# Patient Record
Sex: Female | Born: 1955 | Race: White | Hispanic: No | Marital: Married | Smoking: Never smoker
Health system: Southern US, Community
[De-identification: ages and names within clinical notes are randomized; demographics above are authoritative.]

## PROBLEM LIST (undated history)

## (undated) DIAGNOSIS — Z8041 Family history of malignant neoplasm of ovary: Secondary | ICD-10-CM

## (undated) DIAGNOSIS — C4491 Basal cell carcinoma of skin, unspecified: Secondary | ICD-10-CM

## (undated) DIAGNOSIS — Z1501 Genetic susceptibility to malignant neoplasm of breast: Secondary | ICD-10-CM

## (undated) DIAGNOSIS — I10 Essential (primary) hypertension: Secondary | ICD-10-CM

## (undated) DIAGNOSIS — R87619 Unspecified abnormal cytological findings in specimens from cervix uteri: Secondary | ICD-10-CM

## (undated) DIAGNOSIS — F329 Major depressive disorder, single episode, unspecified: Secondary | ICD-10-CM

## (undated) DIAGNOSIS — G459 Transient cerebral ischemic attack, unspecified: Secondary | ICD-10-CM

## (undated) DIAGNOSIS — K529 Noninfective gastroenteritis and colitis, unspecified: Secondary | ICD-10-CM

## (undated) DIAGNOSIS — F32A Depression, unspecified: Secondary | ICD-10-CM

## (undated) DIAGNOSIS — K219 Gastro-esophageal reflux disease without esophagitis: Secondary | ICD-10-CM

## (undated) DIAGNOSIS — Z808 Family history of malignant neoplasm of other organs or systems: Secondary | ICD-10-CM

## (undated) DIAGNOSIS — IMO0002 Reserved for concepts with insufficient information to code with codable children: Secondary | ICD-10-CM

## (undated) DIAGNOSIS — F419 Anxiety disorder, unspecified: Secondary | ICD-10-CM

## (undated) DIAGNOSIS — Z800 Family history of malignant neoplasm of digestive organs: Secondary | ICD-10-CM

## (undated) DIAGNOSIS — Z801 Family history of malignant neoplasm of trachea, bronchus and lung: Secondary | ICD-10-CM

## (undated) HISTORY — DX: Basal cell carcinoma of skin, unspecified: C44.91

## (undated) HISTORY — DX: Depression, unspecified: F32.A

## (undated) HISTORY — DX: Essential (primary) hypertension: I10

## (undated) HISTORY — DX: Family history of malignant neoplasm of other organs or systems: Z80.8

## (undated) HISTORY — DX: Major depressive disorder, single episode, unspecified: F32.9

## (undated) HISTORY — PX: CERVICAL BIOPSY  W/ LOOP ELECTRODE EXCISION: SUR135

## (undated) HISTORY — DX: Family history of malignant neoplasm of trachea, bronchus and lung: Z80.1

## (undated) HISTORY — DX: Genetic susceptibility to malignant neoplasm of breast: Z15.01

## (undated) HISTORY — DX: Family history of malignant neoplasm of digestive organs: Z80.0

## (undated) HISTORY — DX: Transient cerebral ischemic attack, unspecified: G45.9

## (undated) HISTORY — DX: Gastro-esophageal reflux disease without esophagitis: K21.9

## (undated) HISTORY — DX: Noninfective gastroenteritis and colitis, unspecified: K52.9

## (undated) HISTORY — DX: Anxiety disorder, unspecified: F41.9

## (undated) HISTORY — DX: Family history of malignant neoplasm of ovary: Z80.41

## (undated) HISTORY — DX: Reserved for concepts with insufficient information to code with codable children: IMO0002

## (undated) HISTORY — DX: Unspecified abnormal cytological findings in specimens from cervix uteri: R87.619

---

## 1997-11-04 ENCOUNTER — Other Ambulatory Visit: Admission: RE | Admit: 1997-11-04 | Discharge: 1997-11-04 | Payer: Self-pay | Admitting: Obstetrics and Gynecology

## 1999-01-18 ENCOUNTER — Other Ambulatory Visit: Admission: RE | Admit: 1999-01-18 | Discharge: 1999-01-18 | Payer: Self-pay | Admitting: Obstetrics and Gynecology

## 1999-05-08 HISTORY — PX: OOPHORECTOMY: SHX86

## 1999-12-13 ENCOUNTER — Other Ambulatory Visit: Admission: RE | Admit: 1999-12-13 | Discharge: 1999-12-13 | Payer: Self-pay | Admitting: Obstetrics and Gynecology

## 2001-03-14 ENCOUNTER — Other Ambulatory Visit: Admission: RE | Admit: 2001-03-14 | Discharge: 2001-03-14 | Payer: Self-pay | Admitting: Obstetrics and Gynecology

## 2001-04-22 ENCOUNTER — Ambulatory Visit (HOSPITAL_COMMUNITY): Admission: RE | Admit: 2001-04-22 | Discharge: 2001-04-22 | Payer: Self-pay | Admitting: Obstetrics and Gynecology

## 2001-04-22 ENCOUNTER — Encounter (INDEPENDENT_AMBULATORY_CARE_PROVIDER_SITE_OTHER): Payer: Self-pay

## 2002-11-06 ENCOUNTER — Other Ambulatory Visit: Admission: RE | Admit: 2002-11-06 | Discharge: 2002-11-06 | Payer: Self-pay | Admitting: Obstetrics and Gynecology

## 2003-04-27 ENCOUNTER — Other Ambulatory Visit: Payer: Self-pay

## 2004-04-12 ENCOUNTER — Other Ambulatory Visit: Admission: RE | Admit: 2004-04-12 | Discharge: 2004-04-12 | Payer: Self-pay | Admitting: Obstetrics and Gynecology

## 2004-07-05 ENCOUNTER — Ambulatory Visit: Payer: Self-pay | Admitting: Family Medicine

## 2007-08-11 ENCOUNTER — Other Ambulatory Visit: Admission: RE | Admit: 2007-08-11 | Discharge: 2007-08-11 | Payer: Self-pay | Admitting: Obstetrics and Gynecology

## 2008-08-16 ENCOUNTER — Other Ambulatory Visit: Admission: RE | Admit: 2008-08-16 | Discharge: 2008-08-16 | Payer: Self-pay | Admitting: Obstetrics & Gynecology

## 2009-01-12 ENCOUNTER — Encounter: Payer: Self-pay | Admitting: Family Medicine

## 2009-08-18 ENCOUNTER — Ambulatory Visit: Payer: Self-pay | Admitting: Unknown Physician Specialty

## 2009-08-24 LAB — HM MAMMOGRAPHY: HM Mammogram: NORMAL

## 2009-08-24 LAB — CONVERTED CEMR LAB: Pap Smear: NORMAL

## 2009-12-14 LAB — HM COLONOSCOPY: HM Colonoscopy: NORMAL

## 2010-01-25 ENCOUNTER — Encounter: Payer: Self-pay | Admitting: Family Medicine

## 2010-03-02 ENCOUNTER — Ambulatory Visit: Payer: Self-pay | Admitting: Unknown Physician Specialty

## 2010-05-19 ENCOUNTER — Ambulatory Visit
Admission: RE | Admit: 2010-05-19 | Discharge: 2010-05-19 | Payer: Self-pay | Source: Home / Self Care | Attending: Family Medicine | Admitting: Family Medicine

## 2010-05-19 ENCOUNTER — Other Ambulatory Visit: Payer: Self-pay | Admitting: Family Medicine

## 2010-05-19 DIAGNOSIS — F419 Anxiety disorder, unspecified: Secondary | ICD-10-CM | POA: Insufficient documentation

## 2010-05-19 DIAGNOSIS — R5381 Other malaise: Secondary | ICD-10-CM | POA: Insufficient documentation

## 2010-05-19 DIAGNOSIS — K219 Gastro-esophageal reflux disease without esophagitis: Secondary | ICD-10-CM | POA: Insufficient documentation

## 2010-05-19 DIAGNOSIS — R5383 Other fatigue: Secondary | ICD-10-CM

## 2010-05-19 DIAGNOSIS — I10 Essential (primary) hypertension: Secondary | ICD-10-CM | POA: Insufficient documentation

## 2010-05-19 DIAGNOSIS — F329 Major depressive disorder, single episode, unspecified: Secondary | ICD-10-CM | POA: Insufficient documentation

## 2010-05-19 DIAGNOSIS — F411 Generalized anxiety disorder: Secondary | ICD-10-CM | POA: Insufficient documentation

## 2010-05-19 LAB — CBC WITH DIFFERENTIAL/PLATELET
Basophils Absolute: 0.1 10*3/uL (ref 0.0–0.1)
Basophils Relative: 1 % (ref 0.0–3.0)
Eosinophils Absolute: 0.1 10*3/uL (ref 0.0–0.7)
Eosinophils Relative: 1 % (ref 0.0–5.0)
HCT: 37.1 % (ref 36.0–46.0)
Hemoglobin: 12.9 g/dL (ref 12.0–15.0)
Lymphocytes Relative: 32.1 % (ref 12.0–46.0)
Lymphs Abs: 2.2 10*3/uL (ref 0.7–4.0)
MCHC: 34.9 g/dL (ref 30.0–36.0)
MCV: 93.4 fl (ref 78.0–100.0)
Monocytes Absolute: 0.4 10*3/uL (ref 0.1–1.0)
Monocytes Relative: 6.3 % (ref 3.0–12.0)
Neutro Abs: 4 10*3/uL (ref 1.4–7.7)
Neutrophils Relative %: 59.6 % (ref 43.0–77.0)
Platelets: 228 10*3/uL (ref 150.0–400.0)
RBC: 3.97 Mil/uL (ref 3.87–5.11)
RDW: 13.6 % (ref 11.5–14.6)
WBC: 6.8 10*3/uL (ref 4.5–10.5)

## 2010-05-19 LAB — BASIC METABOLIC PANEL
BUN: 16 mg/dL (ref 6–23)
CO2: 32 mEq/L (ref 19–32)
Calcium: 9 mg/dL (ref 8.4–10.5)
Chloride: 100 mEq/L (ref 96–112)
Creatinine, Ser: 0.9 mg/dL (ref 0.4–1.2)
GFR: 71.74 mL/min (ref >60.00)
Glucose, Bld: 83 mg/dL (ref 70–99)
Potassium: 4 mEq/L (ref 3.5–5.1)
Sodium: 139 mEq/L (ref 135–145)

## 2010-05-19 LAB — TSH: TSH: 0.65 u[IU]/mL (ref 0.35–5.50)

## 2010-05-23 ENCOUNTER — Encounter: Payer: Self-pay | Admitting: Family Medicine

## 2010-06-08 NOTE — Assessment & Plan Note (Signed)
Summary: NEW PT TO EST..BCBS...CYD   Vital Signs:  Patient profile:   55 year old female Height:      65 inches Weight:      195.12 pounds BMI:     32.59 Temp:     98.6 degrees F oral Pulse rate:   74 / minute Pulse rhythm:   regular BP sitting:   130 / 90  (right arm) Cuff size:   regular  Vitals Entered By: Linde Gillis CMA Duncan Dull) (02-Jun-2010 2:01 PM) CC: new patient, establish care   History of Present Illness: 55 yo here to establish care.  HTN- has been on Triamterene-HCTZ 75-50 mg daily for years.  PCP added Amlodipine 5 mg daily a few months ago.  She has not noticed any side effects. No HA, blurred vision, CP, SOB or LE edema.  Depression/anxiety- has been on Prozac 20 mg for several years along with Temazepam as needed.  Denies any symptoms of anxiety or depression currently.  Leg cramps- has noticed LE cramps intermittently at night for past several months.  Denies any LE weakness.  Told years ago that she has restless leg.    GERD- placed on Omeprazole 40 mg two times a day several months ago!  Pt denies any PUD or GIB.  Unsure why she is on such a high dose.  Just had a "little indigestion." Per pt, normal endoscopy and colonscopy 02/2010.  Fatigue- has been a little more fatigued lately and having difficulty losing weight.  no SOB or CP.  No LE edema.  she wants me to prescribe phenteramine or other weight loss supplemnts.    Preventive Screening-Counseling & Management  Alcohol-Tobacco     Smoking Status: never  Caffeine-Diet-Exercise     Does Patient Exercise: yes      Drug Use:  no.    Current Medications (verified): 1)  Amlodipine Besylate 5 Mg Tabs (Amlodipine Besylate) .... Take One Tablet By Mouth Daily 2)  Temazepam 30 Mg Caps (Temazepam) .... Take One Tablet By Mouth As Needed 3)  Fluoxetine Hcl 10 Mg Caps (Fluoxetine Hcl) .... Take One Tablet By Mouth Daily 4)  Triamterene-Hctz 75-50 Mg Tabs (Triamterene-Hctz) .... One By Mouth Daily 5)   Premarin 0.625 Mg Tabs (Estrogens Conjugated) .... One By Mouth Daily  Allergies (verified): 1)  ! Penicillin  Past History:  Family History: Last updated: June 02, 2010 Dad died of MI at 66 Mom - HTN, DM  Social History: Last updated: 06/02/10 Married 2 adult children works for [employer] Never Smoked Alcohol use-yes Drug use-no Regular exercise-yes  Risk Factors: Exercise: yes (06/02/10)  Risk Factors: Smoking Status: never (06/02/10)  Past Medical History: Hypertension Anxiety Depression GERD  Past Surgical History: Oophorectomy for recurrent ovarian cyst in 09-30-99  Family History: Dad died of MI at 4 Mom - HTN, DM  Social History: Married 2 adult children works for [employer] Never Smoked Alcohol use-yes Drug use-no Regular exercise-yes Smoking Status:  never Drug Use:  no Does Patient Exercise:  yes  Review of Systems      See HPI General:  Denies malaise. Eyes:  Denies blurring. ENT:  Denies difficulty swallowing. CV:  Denies chest pain or discomfort. Resp:  Denies shortness of breath. GI:  Denies abdominal pain, bloody stools, indigestion, nausea, and vomiting. GU:  Denies urinary frequency and urinary hesitancy. MS:  Complains of cramps; denies muscle weakness, stiffness, and thoracic pain. Derm:  Denies rash. Neuro:  Denies headaches. Psych:  Denies anxiety and depression.  Endo:  Denies cold intolerance and heat intolerance. Heme:  Denies abnormal bruising and bleeding.  Physical Exam  General:  alert and overweight-appearing.  NAD. Head:  normocephalic and atraumatic.   Eyes:  vision grossly intact, pupils equal, and pupils round.   Ears:  R ear normal and L ear normal.   Nose:  no external deformity.   Mouth:  good dentition.   Neck:  No deformities, masses, or tenderness noted. Lungs:  Normal respiratory effort, chest expands symmetrically. Lungs are clear to auscultation, no crackles or wheezes. Heart:  Normal rate and regular  rhythm. S1 and S2 normal without gallop, murmur, click, rub or other extra sounds. Abdomen:  Bowel sounds positive,abdomen soft and non-tender without masses, organomegaly or hernias noted. Msk:  No deformity or scoliosis noted of thoracic or lumbar spine.   Extremities:  no edema Neurologic:  alert & oriented X3 and gait normal.   Skin:  Intact without suspicious lesions or rashes Psych:  Cognition and judgment appear intact. Alert and cooperative with normal attention span and concentration. No apparent delusions, illusions, hallucinations   Impression & Recommendations:  Problem # 1:  HYPERTENSION (ICD-401.9) Assessment Unchanged stable on current medications.  Check BMET.   Her updated medication list for this problem includes:    Amlodipine Besylate 5 Mg Tabs (Amlodipine besylate) .Marland Kitchen... Take one tablet by mouth daily    Triamterene-hctz 75-50 Mg Tabs (Triamterene-hctz) ..... One by mouth daily  Problem # 2:  FATIGUE (ICD-780.79) Assessment: New Likely multifactorial due to busy lifestyle changes.  Will check labs like CBC, TSH and BMET. Explained that I would not prescribe weight loss medications due to her HTN. Orders: Venipuncture (04540) TLB-BMP (Basic Metabolic Panel-BMET) (80048-METABOL) TLB-CBC Platelet - w/Differential (85025-CBCD) TLB-TSH (Thyroid Stimulating Hormone) (84443-TSH)  Problem # 3:  DEPRESSION (ICD-311) Assessment: Unchanged Stable on current medications. Her updated medication list for this problem includes:    Fluoxetine Hcl 10 Mg Caps (Fluoxetine hcl) .Marland Kitchen... Take one tablet by mouth daily  Problem # 4:  GERD (ICD-530.81) Assessment: Improved Advised to at least decrease Omeprazole to 40 mg once daily or stop it all together.  Pt wants to stop it and monitor her symptoms.  Awaiting records.  Complete Medication List: 1)  Amlodipine Besylate 5 Mg Tabs (Amlodipine besylate) .... Take one tablet by mouth daily 2)  Temazepam 30 Mg Caps (Temazepam) ....  Take one tablet by mouth as needed 3)  Fluoxetine Hcl 10 Mg Caps (Fluoxetine hcl) .... Take one tablet by mouth daily 4)  Triamterene-hctz 75-50 Mg Tabs (Triamterene-hctz) .... One by mouth daily 5)  Premarin 0.625 Mg Tabs (Estrogens conjugated) .... One by mouth daily  Patient Instructions: 1)  Great to meet you. 2)  Have a wonderful trip to [place]. Prescriptions: TRIAMTERENE-HCTZ 75-50 MG TABS (TRIAMTERENE-HCTZ) one by mouth daily  #90 x 3   Entered and Authorized by:   Ruthe Mannan MD   Signed by:   Ruthe Mannan MD on 05/19/2010   Method used:   Print then Give to Patient   RxID:   [id number]    Orders Added: 1)  Venipuncture [08657] 2)  TLB-BMP (Basic Metabolic Panel-BMET) [80048-METABOL] 3)  TLB-CBC Platelet - w/Differential [85025-CBCD] 4)  TLB-TSH (Thyroid Stimulating Hormone) [84443-TSH] 5)  New Patient Level IV [84696]    Current Allergies (reviewed today): ! PENICILLIN  Flex Sig Next Due:  Not Indicated Colonoscopy Result Date:  12/14/2009 Colonoscopy Result:  historical normal Hemoccult Next Due:  Not Indicated PAP Result Date:  08/24/2009 PAP Result:  historical normal Mammogram Result Date:  08/24/2009 Mammogram Result:  historical normal

## 2010-06-13 ENCOUNTER — Ambulatory Visit: Payer: Self-pay | Admitting: Family Medicine

## 2010-06-14 NOTE — Miscellaneous (Signed)
Summary: [facility]  ROI   Imported By: Kassie Mends 06/09/2010 08:43:40  _____________________________________________________________________  External Attachment:    Type:   Image     Comment:   Scanned Image

## 2010-06-22 NOTE — Letter (Signed)
Summary: [facility]   Imported By: Kassie Mends 06/12/2010 08:55:35  _____________________________________________________________________  External Attachment:    Type:   Image     Comment:   External Document

## 2010-09-22 NOTE — Op Note (Signed)
[facility]  Patient:    Carla Fuentes, Carla Fuentes Visit Number: [id number] MRN: [id number]          Service Type: DSU Location: [facility] Attending Physician:  Brynda Peon Dictated by:   Edwena Felty. Ashley Royalty, M.D. Proc. Date: 04/22/01 Admit Date:  04/22/2001                             Operative Report  PREOPERATIVE DIAGNOSIS:       A 6 cm right ovarian cyst.  POSTOPERATIVE DIAGNOSIS:      A 6 cm right ovarian cyst, pathology pending.  OPERATION:                    Laparoscopic right salpingo-oophorectomy.  SURGEON:                      Cynthia P. Ashley Royalty, M.D.  ASSISTANT:                    Laqueta Linden, M.D.  ANESTHESIA:                   General endotracheal anesthesia.  ESTIMATED BLOOD LOSS:         Minimal.  COMPLICATIONS:                None.  DESCRIPTION OF PROCEDURE:     The patient was taken to the operating room and after the induction of adequate general endotracheal anesthesia, was placed in the dorsal lithotomy position and prepped and draped in the usual fashion. The bladder was drained with a red rubber catheter.  An acorn uterine manipulator was placed.  A small umbilical incision was made and the Veress needle was inserted into the peritoneal space.  Proper placement was tested by noting a negative aspirate, then free flow of saline through the Veress needle, again with a negative aspirate, and then by noting the response and a drop of saline placed at the hub of the Veress needle to negative pressure as the abdominal wall was elevated.  Pneumoperitoneum was created with three liters of CO2 using the automatic insufflator.  A disposable 10/11 mm trocar was then inserted into the peritoneal space and its proper placement noted with the laparoscope.  Small suprapubic incisions were made on the right and left sides and the lower 5 mm trocars were inserted under direct visualization.  There was subcutaneous air that  apparently happened during the creation of the pneumoperitoneum that pushed the peritoneum away from the subcutaneous tissue and the skin.  This was noted through the laparoscope. The uterine anatomy was normal and was freely mobile.  The left tube and ovary were also normal.  On the right there was a large right ovarian cyst.  It was smooth.  The tube appeared normal on that side.  The tripolar cautery was used to separate the tube and ovary from the uterus, cauterizing the tubes, the round and the utero-ovarian ligaments and cutting them with the tripolar. When these were free, the ovarian cyst was elevated and the ureter was clearly identified.  Two 0 Vicryl endoloops were then used to tie off the infundibulopelvic ligament.  The specimen was removed with the hook scissors. The cyst was too large initially to allow the cyst to pass through an open endoloop and, therefore, before closing down the endoloop, the cyst was aspirated for clear fluid and this  was sent to pathology along with the specimen when it was removed.   The cyst was grasped with pickups after removal while still in the abdomen and separated into several pieces that could then be removed through the 10 mm trocar in the umbilicus.  Specimen was sent to pathology.  The pedicle was irrigated and was hemostatic.  The ureter was again identified on that side and was intact.  The abdomen was irrigated and the procedure was terminated.  The instruments were removed from the abdomen.  The pneumoperitoneum was allowed to escape.  The incisions were closed subcuticularly with 3-0 Vicryl rapheed, 0.5% Marcaine with epinephrine was used to inject subcutaneously for postop pain relief.  Dressings were placed.  The instruments were removed from the vagina and the procedure was terminated.  The patient tolerated it well and went in satisfactory condition to post anesthesia recovery. Dictated by:   Edwena Felty. Ashley Royalty, M.D. Attending  Physician:  Brynda Peon DD:  04/22/01 TD:  04/22/01 Job: [id number]

## 2010-10-11 ENCOUNTER — Emergency Department: Payer: Self-pay | Admitting: Emergency Medicine

## 2010-11-23 ENCOUNTER — Other Ambulatory Visit: Payer: Self-pay | Admitting: Family Medicine

## 2010-11-23 NOTE — Telephone Encounter (Signed)
Ok to refill once, needs office visit.

## 2010-12-27 ENCOUNTER — Other Ambulatory Visit: Payer: Self-pay | Admitting: Family Medicine

## 2010-12-28 ENCOUNTER — Other Ambulatory Visit: Payer: Self-pay | Admitting: *Deleted

## 2010-12-28 ENCOUNTER — Encounter: Payer: Self-pay | Admitting: Family Medicine

## 2010-12-28 LAB — HM SIGMOIDOSCOPY

## 2010-12-28 MED ORDER — AMLODIPINE BESYLATE 5 MG PO TABS: 5.0000 mg | ORAL_TABLET | Freq: Every day | ORAL | Status: DC

## 2010-12-28 NOTE — Telephone Encounter (Signed)
Refill denied, patient needs an OV before any further refills will be given per Dr. Dayton Martes.

## 2010-12-29 ENCOUNTER — Telehealth: Payer: Self-pay | Admitting: *Deleted

## 2010-12-29 ENCOUNTER — Encounter: Payer: BC Managed Care – PPO | Admitting: Family Medicine

## 2010-12-29 NOTE — Progress Notes (Signed)
Pt rescheduled

## 2010-12-29 NOTE — Telephone Encounter (Signed)
Patient was scheduled to see Dr. Dayton Martes today at 1:45 for medication refill.  She showed up at 2:01 pm and was advised per Dr. Dayton Martes she will need to reschedule due to her being late.  We offered her an appt with Dr. Sharen Hones but she refused.  Patient stated that it was rude of Dr. Dayton Martes to not see her.  Patient became angry and left.  Jamesetta So was advised of this.

## 2010-12-29 NOTE — Telephone Encounter (Signed)
I will route to [place].

## 2011-01-01 NOTE — Telephone Encounter (Signed)
Noted  

## 2011-01-27 LAB — HM PAP SMEAR

## 2011-03-08 ENCOUNTER — Telehealth: Payer: Self-pay | Admitting: Family Medicine

## 2011-03-08 MED ORDER — CONJ ESTROG-MEDROXYPROGEST ACE PO TABS: 1.0000 | ORAL_TABLET | Freq: Every day | ORAL | Status: DC

## 2011-03-08 NOTE — Telephone Encounter (Signed)
Called pt, she is not on premarin. Her GYN prescribes premphase. Updated her med list.

## 2011-05-15 ENCOUNTER — Encounter: Payer: Self-pay | Admitting: Family Medicine

## 2011-05-15 ENCOUNTER — Telehealth: Payer: Self-pay | Admitting: *Deleted

## 2011-05-15 ENCOUNTER — Ambulatory Visit (INDEPENDENT_AMBULATORY_CARE_PROVIDER_SITE_OTHER)
Admission: RE | Admit: 2011-05-15 | Discharge: 2011-05-15 | Disposition: A | Discharge status: Home / Self Care | Payer: BC Managed Care – PPO | Source: Ambulatory Visit | Attending: Family Medicine | Admitting: Family Medicine

## 2011-05-15 ENCOUNTER — Ambulatory Visit (INDEPENDENT_AMBULATORY_CARE_PROVIDER_SITE_OTHER): Payer: BC Managed Care – PPO | Admitting: Family Medicine

## 2011-05-15 VITALS — BP 130/80 | HR 62 | Temp 97.7°F

## 2011-05-15 DIAGNOSIS — R059 Cough, unspecified: Secondary | ICD-10-CM

## 2011-05-15 DIAGNOSIS — R05 Cough: Secondary | ICD-10-CM

## 2011-05-15 MED ORDER — FLUTICASONE-SALMETEROL 100-50 MCG/DOSE IN AEPB: 1.0000 | INHALATION_SPRAY | RESPIRATORY_TRACT | Status: DC

## 2011-05-15 MED ORDER — ALBUTEROL SULFATE HFA 108 (90 BASE) MCG/ACT IN AERS: 2.0000 | INHALATION_SPRAY | Freq: Four times a day (QID) | RESPIRATORY_TRACT | Status: DC | PRN

## 2011-05-15 NOTE — Patient Instructions (Signed)
Good to see you. I will call you with your xray results.   

## 2011-05-15 NOTE — Progress Notes (Signed)
Subjective:    Patient ID: Carla Fuentes, female    DOB: 04/16/1956, 56 y.o.   MRN: [id number]  HPI  56 yo non smoker here to discuss cough for several years.  She has had some URI symptoms but she is more concerned that this cough is dry and has been on and off for several years.  Sometimes she feels SOB.  Was told years ago it was reflux but antacids do not seem to help. Never had formal allergy testing done.  She is very concerned because in the 1990s she remodeled a kitchen that was lined with asbestos.    No fevers, weight loss, extreme fatigue or night sweats.  Patient Active Problem List  Diagnoses  . ANXIETY  . DEPRESSION  . HYPERTENSION  . GERD  . FATIGUE  . Cough   Past Medical History  Diagnosis Date  . Hypertension   . Anxiety   . Depression   . GERD (gastroesophageal reflux disease)    Past Surgical History  Procedure Date  . Oophorectomy 2001    for recurrent ovarian cyst   History  Substance Use Topics  . Smoking status: Never Smoker   . Smokeless tobacco: Not on file  . Alcohol Use: Yes   Family History  Problem Relation Age of Onset  . Diabetes Mother   . Hypertension Mother    Allergies  Allergen Reactions  . Penicillins    Current Outpatient Prescriptions on File Prior to Visit  Medication Sig Dispense Refill  . amLODipine (NORVASC) 5 MG tablet Take 1 tablet (5 mg total) by mouth daily.  30 tablet  6  . conjugated estrogens-medroxyprogesteron (PREMPHASE) TABS Take 1 tablet by mouth daily.  30 tablet  0  . FLUoxetine (PROZAC) 10 MG tablet Take 10 mg by mouth daily.        . temazepam (RESTORIL) 30 MG capsule Take 30 mg by mouth as needed.        . triamterene-hydrochlorothiazide (MAXZIDE) 75-50 MG per tablet Take 1 tablet by mouth daily.         The PMH, PSH, Social History, Family History, Medications, and allergies have been reviewed in [facility], and have been updated if relevant.   Review of Systems    See HPI No GERD  symptoms. Objective:   Physical Exam BP 130/80  Pulse 62  Temp(Src) 97.7 F (36.5 C) (Oral)  General:  Well-developed,well-nourished,in no acute distress; alert,appropriate and cooperative throughout examination Head:  normocephalic and atraumatic.   Eyes:  vision grossly intact, pupils equal, pupils round, and pupils reactive to light.   Ears:  R ear normal and L ear normal.   Nose:  no external deformity.   Mouth:  good dentition, mild PND.   Lungs:  Normal respiratory effort, chest expands symmetrically. Lungs are clear to auscultation, no crackles or wheezes. Heart:  Normal rate and regular rhythm. S1 and S2 normal without gallop, murmur, click, rub or other extra sounds. Msk:  No deformity or scoliosis noted of thoracic or lumbar spine.   Extremities:  No clubbing, cyanosis, edema, or deformity noted with normal full range of motion of all joints.   Neurologic:  alert & oriented X3 and gait normal.   Skin:  Intact without suspicious lesions or rashes Psych:  Cognition and judgment appear intact. Alert and cooperative with normal attention span and concentration. No apparent delusions, illusions, hallucinations    Assessment & Plan:   1. Cough  DG Chest 2 View   Persistent.  Will get xray today.  No abnormal lungs sounds- will try advair to see if there is a reactive airway component although no wheezes on exam today. Explained that unfortunately there is no screening test for mesothelioma to date but we can get an xray today and follow up with a CT scan to see if there is any scarring or pleural thickening. The patient indicates understanding of these issues and agrees with the plan. For cough, we can refer to pulm.  She would like to wait to see results of xray and CT scan first.

## 2011-05-15 NOTE — Telephone Encounter (Signed)
Order placed

## 2011-05-15 NOTE — Telephone Encounter (Signed)
Patient would like referral for CT scan.  She will wait to hear from [facility].

## 2011-05-18 ENCOUNTER — Ambulatory Visit (INDEPENDENT_AMBULATORY_CARE_PROVIDER_SITE_OTHER)
Admission: RE | Admit: 2011-05-18 | Discharge: 2011-05-18 | Disposition: A | Discharge status: Home / Self Care | Payer: BC Managed Care – PPO | Source: Ambulatory Visit | Attending: Family Medicine | Admitting: Family Medicine

## 2011-05-18 ENCOUNTER — Telehealth: Payer: Self-pay | Admitting: Internal Medicine

## 2011-05-18 DIAGNOSIS — R059 Cough, unspecified: Secondary | ICD-10-CM

## 2011-05-18 DIAGNOSIS — R05 Cough: Secondary | ICD-10-CM

## 2011-05-18 NOTE — Telephone Encounter (Signed)
We have not yet received it and I am leaving in 30 minutes.  She likely will not get a call back today.

## 2011-05-18 NOTE — Telephone Encounter (Signed)
Patient advised as instructed via telephone.  She had a CT scan not MRI.  Advised patient that the results are not back yet, it may take a little longer because it has to be read by a radiologist.  Advised patient that she more than likely will not hear back about the results today but if there is anything urgent seen on the CT scan Dr. Dayton Martes will be notified right away.

## 2011-05-18 NOTE — Telephone Encounter (Signed)
Patient would like to know her MRI results today if possible.  She had the test done today.  She would like a phone call to let her know if we did or didn't receive the results.

## 2011-05-22 ENCOUNTER — Other Ambulatory Visit: Payer: Self-pay | Admitting: Family Medicine

## 2011-05-22 DIAGNOSIS — J309 Allergic rhinitis, unspecified: Secondary | ICD-10-CM | POA: Insufficient documentation

## 2011-05-22 DIAGNOSIS — R05 Cough: Secondary | ICD-10-CM

## 2011-05-22 DIAGNOSIS — R059 Cough, unspecified: Secondary | ICD-10-CM

## 2011-06-12 ENCOUNTER — Institutional Professional Consult (permissible substitution): Payer: BC Managed Care – PPO | Admitting: Pulmonary Disease

## 2011-06-18 ENCOUNTER — Institutional Professional Consult (permissible substitution): Payer: BC Managed Care – PPO | Admitting: Internal Medicine

## 2011-07-16 ENCOUNTER — Institutional Professional Consult (permissible substitution): Payer: BC Managed Care – PPO | Admitting: Internal Medicine

## 2011-08-06 ENCOUNTER — Other Ambulatory Visit: Payer: Self-pay | Admitting: Family Medicine

## 2011-08-29 LAB — HM PAP SMEAR

## 2012-01-08 ENCOUNTER — Other Ambulatory Visit: Payer: Self-pay | Admitting: Family Medicine

## 2012-01-08 ENCOUNTER — Other Ambulatory Visit: Payer: BC Managed Care – PPO

## 2012-01-08 DIAGNOSIS — Z0000 Encounter for general adult medical examination without abnormal findings: Secondary | ICD-10-CM

## 2012-01-08 DIAGNOSIS — R5383 Other fatigue: Secondary | ICD-10-CM

## 2012-01-08 DIAGNOSIS — Z13220 Encounter for screening for lipoid disorders: Secondary | ICD-10-CM

## 2012-01-08 DIAGNOSIS — R5381 Other malaise: Secondary | ICD-10-CM

## 2012-01-08 NOTE — Addendum Note (Signed)
Addended by: Alvina Chou on: 01/08/2012 10:54 AM   Modules accepted: Orders

## 2012-01-09 ENCOUNTER — Encounter: Payer: BC Managed Care – PPO | Admitting: Family Medicine

## 2012-01-21 ENCOUNTER — Other Ambulatory Visit: Payer: Self-pay | Admitting: Family Medicine

## 2012-02-24 ENCOUNTER — Other Ambulatory Visit: Payer: Self-pay | Admitting: Family Medicine

## 2012-02-28 LAB — HM MAMMOGRAPHY: HM Mammogram: NORMAL

## 2012-06-22 ENCOUNTER — Other Ambulatory Visit: Payer: Self-pay | Admitting: Family Medicine

## 2012-07-24 ENCOUNTER — Other Ambulatory Visit: Payer: Self-pay | Admitting: Family Medicine

## 2012-07-24 NOTE — Telephone Encounter (Signed)
Medicine sent to pharmacy

## 2012-07-24 NOTE — Telephone Encounter (Signed)
Ok to refill one month until she gets established with Dr. Dan Humphreys.

## 2012-07-24 NOTE — Telephone Encounter (Signed)
Refill request for amlodipine.  Patient hasnt been seen since 1/13 and she has a new patient appt with Dr. Dan Humphreys next month.

## 2012-08-28 ENCOUNTER — Encounter: Payer: Self-pay | Admitting: Internal Medicine

## 2012-08-28 ENCOUNTER — Ambulatory Visit (INDEPENDENT_AMBULATORY_CARE_PROVIDER_SITE_OTHER): Payer: BC Managed Care – PPO | Admitting: Internal Medicine

## 2012-08-28 VITALS — BP 116/76 | HR 73 | Temp 98.9°F | Ht 65.0 in | Wt 181.0 lb

## 2012-08-28 DIAGNOSIS — J0100 Acute maxillary sinusitis, unspecified: Secondary | ICD-10-CM

## 2012-08-28 DIAGNOSIS — E785 Hyperlipidemia, unspecified: Secondary | ICD-10-CM | POA: Insufficient documentation

## 2012-08-28 DIAGNOSIS — I10 Essential (primary) hypertension: Secondary | ICD-10-CM

## 2012-08-28 DIAGNOSIS — R5381 Other malaise: Secondary | ICD-10-CM

## 2012-08-28 DIAGNOSIS — F329 Major depressive disorder, single episode, unspecified: Secondary | ICD-10-CM

## 2012-08-28 DIAGNOSIS — E669 Obesity, unspecified: Secondary | ICD-10-CM

## 2012-08-28 LAB — COMPREHENSIVE METABOLIC PANEL
ALT: 17 U/L (ref 0–35)
AST: 17 U/L (ref 0–37)
Albumin: 3.5 g/dL (ref 3.5–5.2)
Alkaline Phosphatase: 62 U/L (ref 39–117)
BUN: 13 mg/dL (ref 6–23)
CO2: 32 mEq/L (ref 19–32)
Calcium: 9.4 mg/dL (ref 8.4–10.5)
Chloride: 100 mEq/L (ref 96–112)
Creatinine, Ser: 0.8 mg/dL (ref 0.4–1.2)
GFR: 74.1 mL/min (ref >60.00)
Glucose, Bld: 95 mg/dL (ref 70–99)
Potassium: 3.5 mEq/L (ref 3.5–5.1)
Sodium: 136 mEq/L (ref 135–145)
Total Bilirubin: 0.3 mg/dL (ref 0.3–1.2)
Total Protein: 6.6 g/dL (ref 6.0–8.3)

## 2012-08-28 LAB — CBC WITH DIFFERENTIAL/PLATELET
Basophils Absolute: 0 10*3/uL (ref 0.0–0.1)
Basophils Relative: 0.4 % (ref 0.0–3.0)
Eosinophils Absolute: 0 10*3/uL (ref 0.0–0.7)
Eosinophils Relative: 0.6 % (ref 0.0–5.0)
HCT: 41 % (ref 36.0–46.0)
Hemoglobin: 13.9 g/dL (ref 12.0–15.0)
Lymphocytes Relative: 27.4 % (ref 12.0–46.0)
Lymphs Abs: 2.2 10*3/uL (ref 0.7–4.0)
MCHC: 33.8 g/dL (ref 30.0–36.0)
MCV: 93.4 fl (ref 78.0–100.0)
Monocytes Absolute: 0.6 10*3/uL (ref 0.1–1.0)
Monocytes Relative: 7.9 % (ref 3.0–12.0)
Neutro Abs: 5 10*3/uL (ref 1.4–7.7)
Neutrophils Relative %: 63.7 % (ref 43.0–77.0)
Platelets: 237 10*3/uL (ref 150.0–400.0)
RBC: 4.39 Mil/uL (ref 3.87–5.11)
RDW: 13.2 % (ref 11.5–14.6)
WBC: 7.9 10*3/uL (ref 4.5–10.5)

## 2012-08-28 LAB — LIPID PANEL
Cholesterol: 200 mg/dL (ref 0–200)
HDL: 52.5 mg/dL (ref >39.00)
LDL Cholesterol: 120 mg/dL — ABNORMAL HIGH (ref 0–99)
Total CHOL/HDL Ratio: 4
Triglycerides: 137 mg/dL (ref 0.0–149.0)
VLDL: 27.4 mg/dL (ref 0.0–40.0)

## 2012-08-28 LAB — VITAMIN B12: Vitamin B-12: >1500 pg/mL — ABNORMAL HIGH (ref 211–911)

## 2012-08-28 LAB — TSH: TSH: 0.97 u[IU]/mL (ref 0.35–5.50)

## 2012-08-28 LAB — LUTEINIZING HORMONE: LH: 20.4 m[IU]/mL

## 2012-08-28 LAB — FOLLICLE STIMULATING HORMONE: FSH: 19.7 m[IU]/mL

## 2012-08-28 MED ORDER — AZITHROMYCIN 250 MG PO TABS: ORAL_TABLET | ORAL | Status: DC

## 2012-08-28 MED ORDER — MEDROXYPROGESTERONE ACETATE 2.5 MG PO TABS: 2.5000 mg | ORAL_TABLET | Freq: Every day | ORAL | Status: DC

## 2012-08-28 MED ORDER — VENLAFAXINE HCL ER 75 MG PO CP24: 75.0000 mg | ORAL_CAPSULE | Freq: Every day | ORAL | Status: DC

## 2012-08-28 MED ORDER — TRIAMTERENE-HCTZ 75-50 MG PO TABS: ORAL_TABLET | ORAL | Status: DC

## 2012-08-28 NOTE — Progress Notes (Signed)
Subjective:    Patient ID: Carla Fuentes, female    DOB: 11/21/1955, 57 y.o.   MRN: [id number]  HPI 57 year old female with history of anxiety/depression, hypertension presents to establish care. She was previously seen by another provider at [facility]. Her primary concern today is recent worsening of anxiety and depression. She notes that her family members have commented that she has not been acting like herself. She reports no improvement with use of fluoxetine. She denies suicidal ideation. She questions whether symptoms of anxiety and depression may be worsened by menopause. She was recently told by her OB/GYN that she is in menopause and she was started on hormone replacement. She denies any improvement in symptoms with this.  She is also concerned today about two-week history of nasal congestion. She reports a for congestion was initially clear and then became purulent. She describes bilateral maxillary sinus pressure and pain. She has been taking over-the-counter Zyrtec and doing nasal saline washes with no improvement. She denies any fever, chills, cough.  In regards to blood pressure, she reports she was initially started on diuretics when she developed lower extremity edema. Then over the next few years her blood pressure continued to be elevated and she was started on amlodipine. She has not taken amlodipine today. She questions whether she actually needs this medication. She denies any chest pain, headache, palpitations. She reports that blood pressures generally low, less than 120/80 when she checks it.  Outpatient Encounter Prescriptions as of 08/28/2012  Medication Sig Dispense Refill  . estradiol (ESTRACE) 1 MG tablet Take 1 mg by mouth daily.      . medroxyPROGESTERone (PROVERA) 2.5 MG tablet Take 1 tablet (2.5 mg total) by mouth daily.  30 tablet  3  . temazepam (RESTORIL) 30 MG capsule Take 30 mg by mouth as needed.        . triamterene-hydrochlorothiazide (MAXZIDE) 75-50 MG per  tablet TAKE 1 TABLET BY MOUTH EVERY DAY  90 tablet  4  . albuterol (PROVENTIL HFA;VENTOLIN HFA) 108 (90 BASE) MCG/ACT inhaler Inhale 2 puffs into the lungs every 6 (six) hours as needed for wheezing.  1 Inhaler  0  . azithromycin (ZITHROMAX Z-PAK) 250 MG tablet Take 2 pills day 1, then 1 pill daily days 2-5  6 each  0  . venlafaxine XR (EFFEXOR-XR) 75 MG 24 hr capsule Take 1 capsule (75 mg total) by mouth daily.  30 capsule  3   No facility-administered encounter medications on file as of 08/28/2012.   BP 116/76  Pulse 73  Temp(Src) 98.9 F (37.2 C) (Oral)  Ht 5\' 5"  (1.651 m)  Wt 181 lb (82.101 kg)  BMI 30.12 kg/m2  SpO2 96%  Review of Systems  Constitutional: Positive for fatigue. Negative for fever, chills, appetite change and unexpected weight change.  HENT: Negative for ear pain, congestion, sore throat, trouble swallowing, neck pain, voice change and sinus pressure.   Eyes: Negative for visual disturbance.  Respiratory: Negative for cough, shortness of breath, wheezing and stridor.   Cardiovascular: Negative for chest pain, palpitations and leg swelling.  Gastrointestinal: Negative for nausea, vomiting, abdominal pain, diarrhea, constipation, blood in stool, abdominal distention and anal bleeding.  Genitourinary: Negative for dysuria and flank pain.  Musculoskeletal: Negative for myalgias, arthralgias and gait problem.  Skin: Negative for color change and rash.  Neurological: Negative for dizziness and headaches.  Hematological: Negative for adenopathy. Does not bruise/bleed easily.  Psychiatric/Behavioral: Positive for dysphoric mood. Negative for suicidal ideas and sleep disturbance. The  patient is nervous/anxious.        Objective:   Physical Exam  Constitutional: She is oriented to person, place, and time. She appears well-developed and well-nourished. No distress.  HENT:  Head: Normocephalic and atraumatic.  Right Ear: External ear normal.  Left Ear: External ear  normal.  Nose: Nose normal.  Mouth/Throat: Oropharynx is clear and moist. No oropharyngeal exudate.  Eyes: Conjunctivae are normal. Pupils are equal, round, and reactive to light. Right eye exhibits no discharge. Left eye exhibits no discharge. No scleral icterus.  Neck: Normal range of motion. Neck supple. No tracheal deviation present. No thyromegaly present.  Cardiovascular: Normal rate, regular rhythm, normal heart sounds and intact distal pulses.  Exam reveals no gallop and no friction rub.   No murmur heard. Pulmonary/Chest: Effort normal and breath sounds normal. No accessory muscle usage. Not tachypneic. No respiratory distress. She has no decreased breath sounds. She has no wheezes. She has no rhonchi. She has no rales. She exhibits no tenderness.  Abdominal: Soft. Bowel sounds are normal. She exhibits no distension and no mass. There is no tenderness. There is no rebound and no guarding.  Musculoskeletal: Normal range of motion. She exhibits no edema and no tenderness.  Lymphadenopathy:    She has no cervical adenopathy.  Neurological: She is alert and oriented to person, place, and time. No cranial nerve deficit. She exhibits normal muscle tone. Coordination normal.  Skin: Skin is warm and dry. No rash noted. She is not diaphoretic. No erythema. No pallor.  Psychiatric: Her speech is normal and behavior is normal. Judgment and thought content normal. Her mood appears anxious. Cognition and memory are normal.          Assessment & Plan:

## 2012-08-28 NOTE — Assessment & Plan Note (Signed)
BMI 30. Encouraged healthy diet and regular physical activity. Discouraged patient from pursuing use of hCG injections as she has used in the past given her known potential side effects. Discussed potentially starting medication to help with appetite such as phentermine or Qsymia, however will hold off to see how she tolerates Effexor first. Follow up 2 weeks.

## 2012-08-28 NOTE — Assessment & Plan Note (Signed)
Symptoms consistent with acute maxillary sinusitis. Will treat with azithromycin. Continue non-sedating antihistamines and prn ibuprofen for pain.

## 2012-08-28 NOTE — Assessment & Plan Note (Signed)
Blood pressure well-controlled today off amlodipine.. Amlodipine and have patient monitor blood pressure at home. Patient will call if blood pressure consistently >140/90. Follow up 2 weeks.

## 2012-08-28 NOTE — Assessment & Plan Note (Signed)
Symptoms of depression recently worsening. Will change from fluoxetine to venlafaxine. Follow up in 2 weeks and prn.

## 2012-08-28 NOTE — Assessment & Plan Note (Signed)
Symptoms of generalized fatigue most likely related to depression and menopause. Will check CBC, CMP, TSH, FSH, LH, B12, Vit D with labs. Follow up 2 weeks. If labs normal and symptoms are persistent, consider sleep study.

## 2012-08-29 LAB — VITAMIN D 25 HYDROXY (VIT D DEFICIENCY, FRACTURES): Vit D, 25-Hydroxy: 44 ng/mL (ref 30–89)

## 2012-09-10 ENCOUNTER — Telehealth: Payer: Self-pay | Admitting: Internal Medicine

## 2012-09-10 NOTE — Telephone Encounter (Signed)
Left detailed message for patient to call office and schedule an appointment.

## 2012-09-10 NOTE — Telephone Encounter (Signed)
Pt needs to make a follow up

## 2012-09-10 NOTE — Telephone Encounter (Signed)
PT WOULD LIKE TO GET A REFERRAL TO BE TESTED FOR SLEEP APNEA.

## 2012-09-10 NOTE — Telephone Encounter (Signed)
PT ALSO WANTED TO KNOW ABOUT THE NEW DIET PILL YOU DISCUSS WITH HER. SHE WANTED MORE INFORMATION ON THIS

## 2012-09-15 NOTE — Telephone Encounter (Signed)
Spoke with patient and appointment has been scheduled 

## 2012-09-15 NOTE — Telephone Encounter (Signed)
Left message to call office to schedule an appointment.

## 2012-09-18 ENCOUNTER — Ambulatory Visit: Payer: BC Managed Care – PPO | Admitting: Internal Medicine

## 2012-09-24 ENCOUNTER — Ambulatory Visit (INDEPENDENT_AMBULATORY_CARE_PROVIDER_SITE_OTHER): Payer: BC Managed Care – PPO | Admitting: Internal Medicine

## 2012-09-24 ENCOUNTER — Encounter: Payer: Self-pay | Admitting: Internal Medicine

## 2012-09-24 VITALS — BP 114/80 | HR 76 | Temp 98.7°F | Wt 183.0 lb

## 2012-09-24 DIAGNOSIS — R41840 Attention and concentration deficit: Secondary | ICD-10-CM | POA: Insufficient documentation

## 2012-09-24 DIAGNOSIS — M545 Low back pain, unspecified: Secondary | ICD-10-CM | POA: Insufficient documentation

## 2012-09-24 DIAGNOSIS — G4700 Insomnia, unspecified: Secondary | ICD-10-CM

## 2012-09-24 MED ORDER — ESZOPICLONE 2 MG PO TABS: 2.0000 mg | ORAL_TABLET | Freq: Every day | ORAL | Status: DC

## 2012-09-24 NOTE — Assessment & Plan Note (Signed)
Persistent insomnia. No improvement with Temazepam. Will try changing to Lunesta. Follow up 1 month.

## 2012-09-24 NOTE — Assessment & Plan Note (Signed)
Symptoms of difficulty concentrating, loss of short term memory, fatigue. Question if sleep apnea may be playing a role. Will set up sleep study. Recent labs including TSH and B12 normal. Will also set up cognitive testing. Follow up 4 weeks.

## 2012-09-24 NOTE — Assessment & Plan Note (Signed)
Chronic, with intermittent flares, most recently after taking Spin class. Consistent with muscular strain. Improvement with massage therapy. Will set up PT with massage,as allowed by her insurance.

## 2012-09-24 NOTE — Progress Notes (Signed)
Subjective:    Patient ID: Carla Fuentes, female    DOB: [birth year], 57 y.o.   MRN: [id number]  HPI 57 year old female with history of hypertension, insomnia, obesity presents for followup. In the interim since her last visit, she reports ongoing fatigue and difficulty concentrating. She questions whether she may have sleep apnea because she is constantly exhausted. She started taking an over-the-counter energy drink to help with fatigue. That her previous visit including B12 and CBC as well as thyroid function were normal. She continues to exercise on a regular basis. Recently, she participated in spinning class and since that time she has had some pain in her right back. She reports she has a history of chronic pain in her right back but this activity seemed to make things worse. She has been going to massage therapist with some improvement in her symptoms. She would like to do this to her insurance if possible. She also reports ongoing difficulty sleeping. She has been taking temazepam with no improvement.  Outpatient Encounter Prescriptions as of 09/24/2012  Medication Sig Dispense Refill  . estradiol (ESTRACE) 1 MG tablet Take 1 mg by mouth daily.      . medroxyPROGESTERone (PROVERA) 2.5 MG tablet Take 1 tablet (2.5 mg total) by mouth daily.  30 tablet  3  . temazepam (RESTORIL) 30 MG capsule Take 30 mg by mouth as needed.        . triamterene-hydrochlorothiazide (MAXZIDE) 75-50 MG per tablet TAKE 1 TABLET BY MOUTH EVERY DAY  90 tablet  4  . venlafaxine XR (EFFEXOR-XR) 75 MG 24 hr capsule Take 1 capsule (75 mg total) by mouth daily.  30 capsule  3  . albuterol (PROVENTIL HFA;VENTOLIN HFA) 108 (90 BASE) MCG/ACT inhaler Inhale 2 puffs into the lungs every 6 (six) hours as needed for wheezing.  1 Inhaler  0  . eszopiclone (LUNESTA) 2 MG TABS Take 1 tablet (2 mg total) by mouth at bedtime. Take immediately before bedtime  30 tablet  3  . [DISCONTINUED] azithromycin (ZITHROMAX Z-PAK) 250 MG  tablet Take 2 pills day 1, then 1 pill daily days 2-5  6 each  0   No facility-administered encounter medications on file as of 09/24/2012.   BP 114/80  Pulse 76  Temp(Src) 98.7 F (37.1 C) (Oral)  Wt 183 lb (83.008 kg)  BMI 30.45 kg/m2  SpO2 99%  Review of Systems  Constitutional: Positive for fatigue. Negative for fever, chills, appetite change and unexpected weight change.  HENT: Negative for ear pain, congestion, sore throat, trouble swallowing, neck pain, voice change and sinus pressure.   Eyes: Negative for visual disturbance.  Respiratory: Negative for cough, shortness of breath, wheezing and stridor.   Cardiovascular: Negative for chest pain, palpitations and leg swelling.  Gastrointestinal: Negative for nausea, vomiting, abdominal pain, diarrhea, constipation, blood in stool, abdominal distention and anal bleeding.  Genitourinary: Negative for dysuria and flank pain.  Musculoskeletal: Negative for myalgias, arthralgias and gait problem.  Skin: Negative for color change and rash.  Neurological: Negative for dizziness and headaches.  Hematological: Negative for adenopathy. Does not bruise/bleed easily.  Psychiatric/Behavioral: Positive for decreased concentration. Negative for suicidal ideas, sleep disturbance and dysphoric mood. The patient is not nervous/anxious.        Objective:   Physical Exam  Constitutional: She is oriented to person, place, and time. She appears well-developed and well-nourished. No distress.  HENT:  Head: Normocephalic and atraumatic.  Right Ear: External ear normal.  Left Ear: External ear normal.  Nose: Nose normal.  Mouth/Throat: Oropharynx is clear and moist. No oropharyngeal exudate.  Eyes: Conjunctivae are normal. Pupils are equal, round, and reactive to light. Right eye exhibits no discharge. Left eye exhibits no discharge. No scleral icterus.  Neck: Normal range of motion. Neck supple. No tracheal deviation present. No thyromegaly present.   Cardiovascular: Normal rate, regular rhythm, normal heart sounds and intact distal pulses.  Exam reveals no gallop and no friction rub.   No murmur heard. Pulmonary/Chest: Effort normal and breath sounds normal. No accessory muscle usage. Not tachypneic. No respiratory distress. She has no decreased breath sounds. She has no wheezes. She has no rhonchi. She has no rales. She exhibits no tenderness.  Musculoskeletal: Normal range of motion. She exhibits no edema and no tenderness.  Lymphadenopathy:    She has no cervical adenopathy.  Neurological: She is alert and oriented to person, place, and time. No cranial nerve deficit. She exhibits normal muscle tone. Coordination normal.  Skin: Skin is warm and dry. No rash noted. She is not diaphoretic. No erythema. No pallor.  Psychiatric: She has a normal mood and affect. Her behavior is normal. Judgment and thought content normal.          Assessment & Plan:

## 2012-10-02 ENCOUNTER — Telehealth: Payer: Self-pay | Admitting: *Deleted

## 2012-10-02 NOTE — Telephone Encounter (Signed)
I have placed the referral for PT/Massage and Amber is looking into this. Diet pills?

## 2012-10-02 NOTE — Telephone Encounter (Signed)
Patient left a message stating she was waiting to hear something back about if insurance pay for massages and suppose to have been calling her in some diet pills.

## 2012-10-03 MED ORDER — FLUOXETINE HCL 40 MG PO CAPS: 40.0000 mg | ORAL_CAPSULE | Freq: Every day | ORAL | Status: DC

## 2012-10-03 NOTE — Telephone Encounter (Signed)
When I saw her at her visit, she was generally doing well. We can stop effexor if it is not working and change to Prozac 40mg  daily #30 with 1 refill. Fine to use xanax 1-3 times daily as needed for severe anxiety. Phentermine can cause increased anxiety, so I would not recommend starting this right now.  She will need a follow up in 2-4 weeks

## 2012-10-03 NOTE — Telephone Encounter (Signed)
Left message to call back  

## 2012-10-03 NOTE — Telephone Encounter (Signed)
Shan Levans is not available until July.

## 2012-10-03 NOTE — Telephone Encounter (Signed)
Patient left a voicemail stating her sleeping medication and anxiety medication is not working. She is not sleeping and does not think the Effexor is working, she was changed to this 4/24 but would like to switch back to the Prozac. Also at her visit you all discussed her taking Phentermine but guess you forgot to give it to her. Would like to know if she can take Xanax?

## 2012-10-08 NOTE — Telephone Encounter (Signed)
Patient was informed on Friday about the medication but wanted me to make you aware she was not able to get an appointment with Shan Levans until July.

## 2012-11-28 ENCOUNTER — Other Ambulatory Visit: Payer: Self-pay | Admitting: *Deleted

## 2012-11-28 MED ORDER — TEMAZEPAM 30 MG PO CAPS: 30.0000 mg | ORAL_CAPSULE | ORAL | Status: DC | PRN

## 2012-12-23 ENCOUNTER — Other Ambulatory Visit: Payer: Self-pay | Admitting: Internal Medicine

## 2013-01-02 ENCOUNTER — Encounter: Payer: Self-pay | Admitting: Obstetrics and Gynecology

## 2013-01-02 ENCOUNTER — Ambulatory Visit: Payer: Self-pay | Admitting: Obstetrics and Gynecology

## 2013-01-02 ENCOUNTER — Ambulatory Visit (INDEPENDENT_AMBULATORY_CARE_PROVIDER_SITE_OTHER): Payer: BC Managed Care – PPO | Admitting: Obstetrics and Gynecology

## 2013-01-02 VITALS — BP 122/80 | HR 64 | Resp 16 | Ht 64.75 in

## 2013-01-02 DIAGNOSIS — Z0000 Encounter for general adult medical examination without abnormal findings: Secondary | ICD-10-CM

## 2013-01-02 DIAGNOSIS — Z01419 Encounter for gynecological examination (general) (routine) without abnormal findings: Secondary | ICD-10-CM

## 2013-01-02 LAB — POCT URINALYSIS DIPSTICK
Bilirubin, UA: NEGATIVE
Blood, UA: NEGATIVE
Glucose, UA: NEGATIVE
Ketones, UA: NEGATIVE
Leukocytes, UA: NEGATIVE
Nitrite, UA: NEGATIVE
Protein, UA: NEGATIVE
Urobilinogen, UA: NEGATIVE
pH, UA: 5

## 2013-01-02 MED ORDER — PROGESTERONE MICRONIZED 100 MG PO CAPS: 100.0000 mg | ORAL_CAPSULE | Freq: Every day | ORAL | Status: DC

## 2013-01-02 MED ORDER — ESTRADIOL 1 MG PO TABS: 1.0000 mg | ORAL_TABLET | Freq: Every day | ORAL | Status: DC

## 2013-01-02 MED ORDER — BUPROPION HCL ER (XL) 150 MG PO TB24: 150.0000 mg | ORAL_TABLET | Freq: Every day | ORAL | Status: DC

## 2013-01-02 NOTE — Progress Notes (Signed)
57 y.o.   Married    Caucasian   female   [id number]   here for annual exam.  No menses for 1 year.  Feels very tired.  Wants to use bioidentical hormones to see if that would help her get more energy.  She also has a lot of stress in her life and wonders if she can add Wellbutrin to her prozac to see if that helps.   Patient's last menstrual period was 12/06/2011.          Sexually active: yes  The current method of family planning is vasectomy.    Exercising: yoga & cycle Last mammogram:  01-03-12 normal Last pap smear: 11-01-10 neg History of abnormal pap: Leep done yrs ago per patient Smoking: no Alcohol: 2 a week Last colonoscopy: 4/11 f/u 5 yrs Last Bone Density:  5/11 f/u 4-63yrs Last tetanus shot: unsure per patient Last cholesterol check: 2014   Hgb: done with pcp     Urine: neg   Family History  Problem Relation Age of Onset  . Diabetes Mother   . Hypertension Mother   . Heart disease Father   . Diabetes Brother     Patient Active Problem List   Diagnosis Date Noted  . Concentration deficit 09/24/2012  . Insomnia 09/24/2012  . Right low back pain 09/24/2012  . Other and unspecified hyperlipidemia 08/28/2012  . Obesity (BMI 30-39.9) 08/28/2012  . ANXIETY 05/19/2010  . DEPRESSION 05/19/2010  . HYPERTENSION 05/19/2010  . GERD 05/19/2010  . FATIGUE 05/19/2010    Past Medical History  Diagnosis Date  . Hypertension     started on medication for LEE  . Anxiety   . Depression   . GERD (gastroesophageal reflux disease)     Past Surgical History  Procedure Laterality Date  . Oophorectomy  2001    for recurrent ovarian cyst  . Cesarean section      2, FTP with fetal distress    Allergies: Penicillins  Current Outpatient Prescriptions  Medication Sig Dispense Refill  . estradiol (ESTRACE) 1 MG tablet Take 1 mg by mouth daily.      Marland Kitchen FLUoxetine (PROZAC) 40 MG capsule Take 1 capsule (40 mg total) by mouth daily.  30 capsule  1  . medroxyPROGESTERone (PROVERA)  2.5 MG tablet Take 1 tablet (2.5 mg total) by mouth daily.  30 tablet  3  . omeprazole (PRILOSEC) 20 MG capsule Take 20 mg by mouth as needed.      . temazepam (RESTORIL) 30 MG capsule Take 30 mg by mouth daily.      Marland Kitchen triamterene-hydrochlorothiazide (MAXZIDE) 75-50 MG per tablet TAKE 1 TABLET BY MOUTH EVERY DAY  90 tablet  4   No current facility-administered medications for this visit.    ROS: Pertinent items are noted in HPI.  Social Hx: married, two children, works as a Economist at [employer] , husband losing his job in December  Exam:    BP 122/80  Pulse 64  Resp 16  Ht 5' 4.75" (1.645 m)  LMP 08/01/2013Declined weight check this year and declined Ht and wt Last yr.     Wt Readings from Last 3 Encounters:  09/24/12 183 lb (83.008 kg)  08/28/12 181 lb (82.101 kg)  05/19/10 195 lb 1.9 oz (88.505 kg)     Ht Readings from Last 3 Encounters:  01/02/13 5' 4.75" (1.645 m)  08/28/12 5\' 5"  (1.651 m)  05/19/10 5\' 5"  (1.651 m)    General appearance: alert,  cooperative and appears stated age Head: Normocephalic, without obvious abnormality, atraumatic Neck: no adenopathy, supple, symmetrical, trachea midline and thyroid not enlarged, symmetric, no tenderness/mass/nodules Lungs: clear to auscultation bilaterally Breasts: Inspection negative, No nipple retraction or dimpling, No nipple discharge or bleeding, No axillary or supraclavicular adenopathy, Normal to palpation without dominant masses Heart: regular rate and rhythm Abdomen: soft, non-tender; bowel sounds normal; no masses,  no organomegaly Extremities: extremities normal, atraumatic, no cyanosis or edema Skin: Skin color, texture, turgor normal. No rashes or lesions Lymph nodes: Cervical, supraclavicular, and axillary nodes normal. No abnormal inguinal nodes palpated Neurologic: Grossly normal   Pelvic: External genitalia:  no lesions              Urethra:  normal appearing urethra with no masses, tenderness or  lesions              Bartholins and Skenes: normal                 Vagina: normal appearing vagina with normal color and discharge, no lesions              Cervix: normal appearance              Pap taken: no        Bimanual Exam:  Uterus:  uterus is normal size, shape, consistency and nontender                                      Adnexa: normal adnexa in size, nontender and no masses                                      Rectovaginal: Confirms                                      Anus:  normal sphincter tone, no lesions  A: normal menopausal exam, on HRT     S/p RSO serous adenoma 2002     P:     mammogram counseled on breast self exam, mammography screening, adequate intake of calcium and vitamin D, diet and exercise return annually or prn Last pap done June 2012, nl, no co-testing.  Next pap due 2015.   Change provera to prometrium since pt wants bioidentical hormones.  Add wellbutrin 150 mg po q am.  Encouraged wt loss and exercise.     An After Visit Summary was printed and given to the patient.

## 2013-01-02 NOTE — Patient Instructions (Addendum)

## 2013-01-07 LAB — COMPREHENSIVE METABOLIC PANEL
Albumin: 3.4 g/dL (ref 3.4–5.0)
Alkaline Phosphatase: 84 U/L (ref 50–136)
Anion Gap: 5 — ABNORMAL LOW (ref 7–16)
BUN: 16 mg/dL (ref 7–18)
Bilirubin,Total: 0.5 mg/dL (ref 0.2–1.0)
Calcium, Total: 9.4 mg/dL (ref 8.5–10.1)
Chloride: 100 mmol/L (ref 98–107)
Co2: 31 mmol/L (ref 21–32)
Creatinine: 1.04 mg/dL (ref 0.60–1.30)
EGFR (African American): >60
EGFR (Non-African Amer.): 59 — ABNORMAL LOW
Glucose: 79 mg/dL (ref 65–99)
Osmolality: 272 (ref 275–301)
Potassium: 3.1 mmol/L — ABNORMAL LOW (ref 3.5–5.1)
SGOT(AST): 16 U/L (ref 15–37)
SGPT (ALT): 18 U/L (ref 12–78)
Sodium: 136 mmol/L (ref 136–145)
Total Protein: 7.8 g/dL (ref 6.4–8.2)

## 2013-01-07 LAB — CBC
HCT: 39.7 % (ref 35.0–47.0)
HGB: 13.9 g/dL (ref 12.0–16.0)
MCH: 32.5 pg (ref 26.0–34.0)
MCHC: 35 g/dL (ref 32.0–36.0)
MCV: 93 fL (ref 80–100)
Platelet: 241 10*3/uL (ref 150–440)
RBC: 4.28 10*6/uL (ref 3.80–5.20)
RDW: 13.3 % (ref 11.5–14.5)
WBC: 10.3 10*3/uL (ref 3.6–11.0)

## 2013-01-08 ENCOUNTER — Observation Stay: Payer: Self-pay | Admitting: Internal Medicine

## 2013-01-08 LAB — SEDIMENTATION RATE: Erythrocyte Sed Rate: 18 mm/hr (ref 0–30)

## 2013-01-09 LAB — BASIC METABOLIC PANEL
Anion Gap: 6 — ABNORMAL LOW (ref 7–16)
BUN: 13 mg/dL (ref 7–18)
Calcium, Total: 8.5 mg/dL (ref 8.5–10.1)
Chloride: 104 mmol/L (ref 98–107)
Co2: 28 mmol/L (ref 21–32)
Creatinine: 0.89 mg/dL (ref 0.60–1.30)
EGFR (African American): >60
EGFR (Non-African Amer.): >60
Glucose: 91 mg/dL (ref 65–99)
Osmolality: 275 (ref 275–301)
Potassium: 2.9 mmol/L — ABNORMAL LOW (ref 3.5–5.1)
Sodium: 138 mmol/L (ref 136–145)

## 2013-01-09 LAB — CBC WITH DIFFERENTIAL/PLATELET
Basophil #: 0 10*3/uL (ref 0.0–0.1)
Basophil %: 0.6 %
Eosinophil #: 0.1 10*3/uL (ref 0.0–0.7)
Eosinophil %: 1.3 %
HCT: 37.9 % (ref 35.0–47.0)
HGB: 13.2 g/dL (ref 12.0–16.0)
Lymphocyte #: 2.7 10*3/uL (ref 1.0–3.6)
Lymphocyte %: 36.5 %
MCH: 32.3 pg (ref 26.0–34.0)
MCHC: 34.9 g/dL (ref 32.0–36.0)
MCV: 93 fL (ref 80–100)
Monocyte #: 0.6 x10 3/mm (ref 0.2–0.9)
Monocyte %: 8.1 %
Neutrophil #: 4 10*3/uL (ref 1.4–6.5)
Neutrophil %: 53.5 %
Platelet: 219 10*3/uL (ref 150–440)
RBC: 4.1 10*6/uL (ref 3.80–5.20)
RDW: 13.2 % (ref 11.5–14.5)
WBC: 7.5 10*3/uL (ref 3.6–11.0)

## 2013-01-09 LAB — LIPID PANEL
Cholesterol: 202 mg/dL — ABNORMAL HIGH (ref 0–200)
HDL Cholesterol: 59 mg/dL (ref 40–60)
Ldl Cholesterol, Calc: 124 mg/dL — ABNORMAL HIGH (ref 0–100)
Triglycerides: 94 mg/dL (ref 0–200)
VLDL Cholesterol, Calc: 19 mg/dL (ref 5–40)

## 2013-01-09 LAB — TSH: Thyroid Stimulating Horm: 0.514 u[IU]/mL

## 2013-01-09 LAB — MAGNESIUM: Magnesium: 2.1 mg/dL

## 2013-01-21 ENCOUNTER — Ambulatory Visit (INDEPENDENT_AMBULATORY_CARE_PROVIDER_SITE_OTHER): Payer: BC Managed Care – PPO | Admitting: *Deleted

## 2013-01-21 DIAGNOSIS — Z23 Encounter for immunization: Secondary | ICD-10-CM

## 2013-01-26 ENCOUNTER — Encounter: Payer: Self-pay | Admitting: Internal Medicine

## 2013-01-26 ENCOUNTER — Ambulatory Visit (INDEPENDENT_AMBULATORY_CARE_PROVIDER_SITE_OTHER): Payer: BC Managed Care – PPO | Admitting: Internal Medicine

## 2013-01-26 VITALS — BP 122/90 | HR 68 | Temp 98.7°F

## 2013-01-26 DIAGNOSIS — G250 Essential tremor: Secondary | ICD-10-CM

## 2013-01-26 DIAGNOSIS — F411 Generalized anxiety disorder: Secondary | ICD-10-CM

## 2013-01-26 DIAGNOSIS — G252 Other specified forms of tremor: Secondary | ICD-10-CM

## 2013-01-26 DIAGNOSIS — R251 Tremor, unspecified: Secondary | ICD-10-CM | POA: Insufficient documentation

## 2013-01-26 LAB — COMPREHENSIVE METABOLIC PANEL
ALT: 17 U/L (ref 0–35)
AST: 15 U/L (ref 0–37)
Albumin: 3.6 g/dL (ref 3.5–5.2)
Alkaline Phosphatase: 70 U/L (ref 39–117)
BUN: 13 mg/dL (ref 6–23)
CO2: 30 mEq/L (ref 19–32)
Calcium: 9.4 mg/dL (ref 8.4–10.5)
Chloride: 99 mEq/L (ref 96–112)
Creatinine, Ser: 0.8 mg/dL (ref 0.4–1.2)
GFR: 73.99 mL/min (ref >60.00)
Glucose, Bld: 79 mg/dL (ref 70–99)
Potassium: 4 mEq/L (ref 3.5–5.1)
Sodium: 135 mEq/L (ref 135–145)
Total Bilirubin: 0.6 mg/dL (ref 0.3–1.2)
Total Protein: 6.7 g/dL (ref 6.0–8.3)

## 2013-01-26 LAB — CBC WITH DIFFERENTIAL/PLATELET
Basophils Absolute: 0 10*3/uL (ref 0.0–0.1)
Basophils Relative: 0.5 % (ref 0.0–3.0)
Eosinophils Absolute: 0.1 10*3/uL (ref 0.0–0.7)
Eosinophils Relative: 1.1 % (ref 0.0–5.0)
HCT: 40.8 % (ref 36.0–46.0)
Hemoglobin: 13.9 g/dL (ref 12.0–15.0)
Lymphocytes Relative: 23.7 % (ref 12.0–46.0)
Lymphs Abs: 1.8 10*3/uL (ref 0.7–4.0)
MCHC: 34.1 g/dL (ref 30.0–36.0)
MCV: 94.2 fl (ref 78.0–100.0)
Monocytes Absolute: 0.5 10*3/uL (ref 0.1–1.0)
Monocytes Relative: 6.5 % (ref 3.0–12.0)
Neutro Abs: 5.1 10*3/uL (ref 1.4–7.7)
Neutrophils Relative %: 68.2 % (ref 43.0–77.0)
Platelets: 220 10*3/uL (ref 150.0–400.0)
RBC: 4.34 Mil/uL (ref 3.87–5.11)
RDW: 13.4 % (ref 11.5–14.6)
WBC: 7.5 10*3/uL (ref 4.5–10.5)

## 2013-01-26 LAB — TSH: TSH: 0.63 u[IU]/mL (ref 0.35–5.50)

## 2013-01-26 LAB — MAGNESIUM: Magnesium: 1.7 mg/dL (ref 1.5–2.5)

## 2013-01-26 LAB — T4, FREE: Free T4: 0.69 ng/dL (ref 0.60–1.60)

## 2013-01-26 NOTE — Progress Notes (Signed)
Subjective:    Patient ID: Carla Fuentes, female    DOB: [birth year], 57 y.o.   MRN: [id number]  HPI 57 year old female with history of anxiety/depression, hypertension presents for followup after recent admission for confusion, dysarthria, and tremor. She went to the ER 2 weeks ago after having difficulty finding words for speech and having tremor in both of her upper extremities. Evaluation including CT head, MRI brain, echocardiogram were all normal. Labs showed hypokalemia. Potassium was supplemented and she was discharged home. She was also started on alprazolam as needed for anxiety. She was told to limit stress in her life including work hours. However, her husband recently lost his job and she is the sole breadwinner. She also reports stress in dealing with her brother who was recently diagnosed with terminal illness. She is tearful describing this today. She is having difficulty concentrating. She questions whether she may have dementia. She reports that her mother may have had Parkinson's disease. She is unsure about this. She wonders if she may have this because of tremor and forgetfulness.  Outpatient Encounter Prescriptions as of 01/26/2013  Medication Sig Dispense Refill  . ALPRAZolam (XANAX) 0.25 MG tablet Take 0.25 mg by mouth 3 (three) times daily as needed for sleep or anxiety (Take 1 tablet by mouth every 8 hours as needed for anxiety).      Marland Kitchen estradiol (ESTRACE) 1 MG tablet Take 1 tablet (1 mg total) by mouth daily.  90 tablet  3  . FLUoxetine (PROZAC) 40 MG capsule Take 1 capsule (40 mg total) by mouth daily.  30 capsule  1  . progesterone (PROMETRIUM) 100 MG capsule Take 1 capsule (100 mg total) by mouth at bedtime.  30 capsule  11  . temazepam (RESTORIL) 30 MG capsule Take 30 mg by mouth daily.      Marland Kitchen triamterene-hydrochlorothiazide (MAXZIDE) 75-50 MG per tablet TAKE 1 TABLET BY MOUTH EVERY DAY  90 tablet  4  . [DISCONTINUED] buPROPion (WELLBUTRIN XL) 150 MG 24 hr tablet Take  1 tablet (150 mg total) by mouth daily.  30 tablet  12  . MELATONIN PO Take by mouth as needed.      Marland Kitchen omeprazole (PRILOSEC) 20 MG capsule Take 20 mg by mouth as needed.       No facility-administered encounter medications on file as of 01/26/2013.   BP 122/90  Pulse 68  Temp(Src) 98.7 F (37.1 C) (Oral)  SpO2 96%  LMP 12/06/2011  Review of Systems  Constitutional: Positive for fatigue. Negative for fever, chills, appetite change and unexpected weight change.  HENT: Negative for ear pain, congestion, sore throat, trouble swallowing, neck pain, voice change and sinus pressure.   Eyes: Negative for visual disturbance.  Respiratory: Negative for cough, shortness of breath, wheezing and stridor.   Cardiovascular: Negative for chest pain, palpitations and leg swelling.  Gastrointestinal: Negative for nausea, vomiting, abdominal pain, diarrhea, constipation, blood in stool, abdominal distention and anal bleeding.  Genitourinary: Negative for dysuria and flank pain.  Musculoskeletal: Negative for myalgias, arthralgias and gait problem.  Skin: Negative for color change and rash.  Neurological: Positive for tremors and speech difficulty (trouble with word finding periodically). Negative for dizziness, seizures, weakness, numbness and headaches.  Hematological: Negative for adenopathy. Does not bruise/bleed easily.  Psychiatric/Behavioral: Positive for dysphoric mood and decreased concentration. Negative for suicidal ideas and sleep disturbance. The patient is nervous/anxious.        Objective:   Physical Exam  Constitutional: She is oriented to person, place,  and time. She appears well-developed and well-nourished. No distress.  HENT:  Head: Normocephalic and atraumatic.  Right Ear: External ear normal.  Left Ear: External ear normal.  Nose: Nose normal.  Mouth/Throat: Oropharynx is clear and moist. No oropharyngeal exudate.  Eyes: Conjunctivae are normal. Pupils are equal, round, and  reactive to light. Right eye exhibits no discharge. Left eye exhibits no discharge. No scleral icterus.  Neck: Normal range of motion. Neck supple. No tracheal deviation present. No thyromegaly present.  Cardiovascular: Normal rate, regular rhythm, normal heart sounds and intact distal pulses.  Exam reveals no gallop and no friction rub.   No murmur heard. Pulmonary/Chest: Effort normal and breath sounds normal. No accessory muscle usage. Not tachypneic. No respiratory distress. She has no decreased breath sounds. She has no wheezes. She has no rhonchi. She has no rales. She exhibits no tenderness.  Musculoskeletal: Normal range of motion. She exhibits no edema and no tenderness.  Lymphadenopathy:    She has no cervical adenopathy.  Neurological: She is alert and oriented to person, place, and time. She displays tremor (intention tremor BUE right>left). She displays no atrophy. No cranial nerve deficit or sensory deficit. She exhibits normal muscle tone. Coordination and gait normal.  Skin: Skin is warm and dry. No rash noted. She is not diaphoretic. No erythema. No pallor.  Psychiatric: Her speech is normal. Judgment and thought content normal. Her mood appears anxious. She is agitated. Cognition and memory are normal. She exhibits a depressed mood.          Assessment & Plan:

## 2013-01-26 NOTE — Assessment & Plan Note (Signed)
Recent development of bilateral intention tremor right>left. Suspect that significant anxiety is making symptoms worse. Will also check CMP, TSH, B12 with labs. Will stop Wellbutrin. Will set up evaluation with Dr. Arbutus Leas in Neurology. Reviewed recent MRI brain from [facility] which was normal. Follow up here in 2 weeks.

## 2013-01-26 NOTE — Assessment & Plan Note (Signed)
Recent significant worsening of symptoms of anxiety and depression with ongoing financial stressors and stress dealing with her brother's illness. Offered support today. Will continue Fluoxetine. Will stop Wellbutrin as I am concerned this may be making tremor worse. Will continue prn alprazolam. Discussed potentially changing Fluoxetine to Sertraline in 1-2 weeks after we monitor symptoms off Wellbutrin. Follow up 2 weeks.

## 2013-01-28 ENCOUNTER — Encounter: Payer: Self-pay | Admitting: *Deleted

## 2013-02-04 ENCOUNTER — Encounter: Payer: Self-pay | Admitting: Neurology

## 2013-02-04 ENCOUNTER — Ambulatory Visit (INDEPENDENT_AMBULATORY_CARE_PROVIDER_SITE_OTHER): Payer: BC Managed Care – PPO | Admitting: Neurology

## 2013-02-04 VITALS — BP 130/90 | HR 70 | Temp 97.5°F | Resp 12

## 2013-02-04 DIAGNOSIS — F411 Generalized anxiety disorder: Secondary | ICD-10-CM

## 2013-02-04 DIAGNOSIS — R259 Unspecified abnormal involuntary movements: Secondary | ICD-10-CM

## 2013-02-04 DIAGNOSIS — F329 Major depressive disorder, single episode, unspecified: Secondary | ICD-10-CM

## 2013-02-04 DIAGNOSIS — F419 Anxiety disorder, unspecified: Secondary | ICD-10-CM

## 2013-02-04 MED ORDER — DULOXETINE HCL 60 MG PO CPEP: 60.0000 mg | ORAL_CAPSULE | Freq: Every day | ORAL | Status: DC

## 2013-02-04 MED ORDER — FLUOXETINE HCL 10 MG PO CAPS: ORAL_CAPSULE | ORAL | Status: DC

## 2013-02-04 MED ORDER — DULOXETINE HCL 30 MG PO CPEP: 30.0000 mg | ORAL_CAPSULE | Freq: Every day | ORAL | Status: DC

## 2013-02-04 NOTE — Patient Instructions (Addendum)
1.  Decrease your prozac as follows:  10 mg tablet:  2 tablets daily for a week and then 1 tablet daily for a week and then stop 2.  cymbalta - 30 mg for a week and then increase to 60 mg daily 3.  You can follow up with your primary care physician or call me if you need me

## 2013-02-04 NOTE — Progress Notes (Signed)
Carla Fuentes was seen today in neurologic consultation at the request of Wynona Dove, MD.  The consultation is for the evaluation of tremor.  The patient reports that she has had this for 4 months.  She notes that tremor is in the R hand.  She reports tremor with eating and holding a cup.  She is R hand dominant.  She noted tremor with doing a presentation.  She doesn't know if it is affected by caffeine.  She doesn't think that it is affected by alcohol.  Her mother had tremor but ? Dx.  She has not noted her tremor at rest.  Handwriting is "sloppier" but not sure that it has changed size.  She does not think that it has changed size.  She has had 2 falls and is having trouble with word finding.  She reports that she just "fell" without reason.  She does not know if stress/fatigue make her tremor worse but she is reported that she is under a lot of stress.  Her brother is dying, her husband just got fired from his job and her daughter is a high risk pregnancy.     Prior medical records were reviewed.  The pt was admitted to [facility] last month after an episode of slurred speech and tremor.  She had a very extensive workup including a carotid ultrasound on 01/08/2013 that was normal.  An echocardiogram on 01/08/2013 was normal, demonstrating a left ventricular ejection fraction of 60% A CT of the brain was negative.  An MRI of the brain was reported to be normal.  A sedimentation rate was 18.  Upon discharge, it was felt that her symptoms may be manifestation of anxiety and the patient was discharged with Xanax.  The pt asks today whether or not this was TIA.      PREVIOUS MEDICATIONS: n/a  ALLERGIES:   Allergies  Allergen Reactions  . Penicillins     CURRENT MEDICATIONS:  Current Outpatient Prescriptions on File Prior to Visit  Medication Sig Dispense Refill  . ALPRAZolam (XANAX) 0.25 MG tablet Take 0.25 mg by mouth 3 (three) times daily as needed for sleep  or anxiety (Take 1 tablet by mouth every 8 hours as needed for anxiety).      Marland Kitchen estradiol (ESTRACE) 1 MG tablet Take 1 tablet (1 mg total) by mouth daily.  90 tablet  3  . FLUoxetine (PROZAC) 40 MG capsule Take 1 capsule (40 mg total) by mouth daily.  30 capsule  1  . MELATONIN PO Take by mouth as needed.      Marland Kitchen omeprazole (PRILOSEC) 20 MG capsule Take 20 mg by mouth as needed.      . progesterone (PROMETRIUM) 100 MG capsule Take 1 capsule (100 mg total) by mouth at bedtime.  30 capsule  11  . temazepam (RESTORIL) 30 MG capsule Take 30 mg by mouth daily.      Marland Kitchen triamterene-hydrochlorothiazide (MAXZIDE) 75-50 MG per tablet TAKE 1 TABLET BY MOUTH EVERY DAY  90 tablet  4   No current facility-administered medications on file prior to visit.    PAST MEDICAL HISTORY:   Past Medical History  Diagnosis Date  . Hypertension     started on medication for LEE  . Anxiety   . Depression   . GERD (gastroesophageal reflux disease)   . Abnormal Pap smear     PAST SURGICAL HISTORY:   Past Surgical History  Procedure Laterality Date  . Oophorectomy  2001  for recurrent ovarian cyst  . Cesarean section      2, FTP with fetal distress  . Cervical biopsy  w/ loop electrode excision      SOCIAL HISTORY:   History   Social History  . Marital Status: Married    Spouse Name: N/A    Number of Children: N/A  . Years of Education: N/A   Occupational History  . Not on file.   Social History Main Topics  . Smoking status: Never Smoker   . Smokeless tobacco: Never Used  . Alcohol Use: 1.0 oz/week    2 drink(s) per week  . Drug Use: No  . Sexual Activity: Yes    Partners: Male     Comment: husband vasectomy   Other Topics Concern  . Not on file   Social History Narrative   Lives with husband, remarried. 2 children, 33YO and 35YO, one child lives [place].      Work - Physicist, medical      Diet - regular diet      Exercise - works out 3-4 times per week    FAMILY  HISTORY:   Family Status  Relation Status Death Age  . Mother Deceased   . Father Deceased 84    MI  . Sister Alive   . Brother Alive   . Daughter Alive   . Son Alive     ROS:  Admits to poor motivation and wants to sleep all the time.  Some word finding trouble.  Recent vertex headaches without vision changes, photophobia or phonophobia.  A complete 10 system review of systems was obtained and was unremarkable apart from what is mentioned above.  PHYSICAL EXAMINATION:    VITALS:   Filed Vitals:   02/04/13 1035  BP: 130/90  Pulse: 70  Temp: 97.5 F (36.4 C)  Resp: 12    GEN:  Normal appears female in no acute distress.  Appears stated age. HEENT:  Normocephalic, atraumatic. The mucous membranes are moist. The superficial temporal arteries are without ropiness or tenderness. Cardiovascular: Regular rate and rhythm. Lungs: Clear to auscultation bilaterally. Neck/Heme: There are no carotid bruits noted bilaterally.  NEUROLOGICAL: Orientation:  The patient is alert and oriented x 3.  Fund of knowledge is appropriate.  Recent and remote memory intact.  Attention span and concentration normal.  Repeats and names without difficulty. Cranial nerves: There is good facial symmetry. The pupils are equal round and reactive to light bilaterally. Fundoscopic exam reveals clear disc margins bilaterally. Extraocular muscles are intact and visual fields are full to confrontational testing. Speech is fluent and clear. Soft palate rises symmetrically and there is no tongue deviation. Hearing is intact to conversational tone. Tone: Tone is good throughout. Sensation: Sensation is intact to light touch and pinprick throughout (facial, trunk, extremities). Vibration is intact at the bilateral big toe. There is no extinction with double simultaneous stimulation. There is no sensory dermatomal level identified. Coordination:  The patient has no difficulty with RAM's or FNF bilaterally. Motor: Strength  is 5/5 in the bilateral upper and lower extremities.  Shoulder shrug is equal and symmetric. There is no pronator drift.  There are no fasciculations noted. DTR's: Deep tendon reflexes are 2/4 at the bilateral biceps, triceps, brachioradialis, patella and achilles.  Plantar responses are downgoing bilaterally. Gait and Station: The patient is able to ambulate without difficulty. The patient is able to heel toe walk without any difficulty. The patient is able to ambulate in a tandem  fashion. The patient is able to stand in the Romberg position. Abnl Movements:  There is virtually no tremor.  She draws archimedes spirals without trouble.  She pours a very full glass of water from one glass to another without spilling it.  She writes a sentence without difficulty.  The handwriting is very large on sentence she writes.  IMPRESSION/PLAN:  1.  Tremor  -I suspect that this may be an enhanced physiologic tremor, but has been exacerbated by an excessive amount of personal stress.  I actually saw no tremor in the office today.  There was nothing focal or lateralizing on her examination.  I reviewed all of her records from [facility].  I do not have the films of the MRI, but reviewed the report.  Reassurance was provided.  I also told her that I do not think that she had a TIA when she presented to [facility].  She had those symptoms for much longer than 24 hours, and continues to report some of those symptoms, without MRI changes.  I told her that I thought that this was a manifestation of stress.  She does not necessarily disagree. 2.  Anxiety/depression.  -I do think that this is the biggest issue, causing somatization of symptoms.  She has no SI/HI.   She asked me if she could discontinue the Prozac, as she does not think that it is helping.  She would like to try something else.  After discussing medications, we ultimately decided on Cymbalta, in hopes that it did help her headache and even  potentially tremor.  I gave her a weaning schedule to get her off of the Prozac and gave her samples of 30 mg of Cymbalta she will take for one week and then she will increase to 60 mg daily.  Risks, benefits, side effects and alternative therapies were discussed.  The opportunity to ask questions was given and they were answered to the best of my ability.  The patient expressed understanding and willingness to follow the outlined treatment protocols. 3.  I would be happy to follow up with the patient on an as-needed basis or if new neurologic issues arise, but she does live out of town and I would be fine if she f/u with her PCP.

## 2013-02-06 ENCOUNTER — Encounter: Payer: Self-pay | Admitting: Internal Medicine

## 2013-02-23 ENCOUNTER — Other Ambulatory Visit: Payer: Self-pay | Admitting: *Deleted

## 2013-02-23 MED ORDER — ALPRAZOLAM 0.25 MG PO TABS: 0.2500 mg | ORAL_TABLET | Freq: Three times a day (TID) | ORAL | Status: DC | PRN

## 2013-02-23 NOTE — Telephone Encounter (Signed)
Patient called requesting refill on Xanax, informed patient Dr. Dan Humphreys has left for the day. This will be done tomorrow, to call and check with pharmacy and for future refills contact her pharmacy and have them contact us.

## 2013-03-02 ENCOUNTER — Telehealth: Payer: Self-pay

## 2013-03-03 ENCOUNTER — Telehealth: Payer: Self-pay

## 2013-03-03 NOTE — Telephone Encounter (Signed)
Pt called to inform that the Cymbalta is not working and she is going to stop taking it. She wants to discuss other med options with Dr.Tat.

## 2013-03-03 NOTE — Telephone Encounter (Signed)
Pt called back today questioning her decision to stop Cymbalta because she is worried about withdrawal symptoms. I explained that she hasn't been taking Cymbalta long enough to have severe withdrawal symptoms. I then gave her Dr.Patel's instructions on taking 30mg  of Cymbalta until Dr.Tat is back in town and Dr.Patel offered to send this rx in to her pharmacy. She declined this advice and said she will call her PCP and ask them to help her with this situation.

## 2013-03-06 ENCOUNTER — Telehealth: Payer: Self-pay | Admitting: *Deleted

## 2013-03-06 NOTE — Telephone Encounter (Signed)
She needs to be seen to discuss this. visit.

## 2013-03-06 NOTE — Telephone Encounter (Signed)
Patient called left a message stating her neurologist put her on Cymbalta but she stopped taking it cold [place] because it was making her really sleepy. Now she is having horrible nightmares, her neurologist told her she could quit cold [place] since she had been on it less than a month.She is currently taking nothing for her anxiety, would like to go back on Zoloft, she took that several years ago. Please advise

## 2013-03-06 NOTE — Telephone Encounter (Signed)
Fine to stop the Cymbalta given she had just started it.

## 2013-03-06 NOTE — Telephone Encounter (Signed)
Patient informed and patient scheduled to see Raquel on Monday due to no openings on Dr. Tilman Neat schedule. However she wanted to make sure it was ok for her to stop the Cymbalta cold [place] like that.

## 2013-03-09 ENCOUNTER — Ambulatory Visit (INDEPENDENT_AMBULATORY_CARE_PROVIDER_SITE_OTHER): Payer: BC Managed Care – PPO | Admitting: Adult Health

## 2013-03-09 ENCOUNTER — Encounter: Payer: Self-pay | Admitting: Adult Health

## 2013-03-09 VITALS — BP 118/72 | HR 76 | Resp 12

## 2013-03-09 DIAGNOSIS — F411 Generalized anxiety disorder: Secondary | ICD-10-CM

## 2013-03-09 DIAGNOSIS — F329 Major depressive disorder, single episode, unspecified: Secondary | ICD-10-CM

## 2013-03-09 MED ORDER — SERTRALINE HCL 50 MG PO TABS: 50.0000 mg | ORAL_TABLET | Freq: Every day | ORAL | Status: DC

## 2013-03-09 NOTE — Telephone Encounter (Signed)
Patient aware.

## 2013-03-09 NOTE — Assessment & Plan Note (Signed)
Taking xanax 0.25 mg tid as needed. She is not taking these on a daily basis.

## 2013-03-09 NOTE — Assessment & Plan Note (Addendum)
Start Zoloft 50 mg daily. Encourage patient to return to an exercise routine - walking, spinning whatever she feels she is able to do. Gradually increase activity. Follow up in 4 weeks. Note greater than 30 minutes were spent in face to face communication with patient allowing her to vent and express her concerns. Also in the assessment, evaluation, planning and implementation of care pertaining to her problems.

## 2013-03-09 NOTE — Patient Instructions (Signed)
  Start zoloft 50 mg daily.  Slowly return to your exercising.  Return for follow up to see Dr. Dan Humphreys in 4 weeks.

## 2013-03-09 NOTE — Progress Notes (Signed)
  Subjective:    Patient ID: Carla Fuentes, female    DOB: 08-12-1955, 57 y.o.   MRN: [id number]  HPI  Patient is a pleasant 57 y/o female who presents to clinic with increasing symptoms of depression and anxiety. She reports inability to focus. 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 Increased stress - her husband recently lost his job. Patient was on prozac for several years. She thought it was no longer helping her so she was switched to Cymbalta. Patient has recently been evaluated by neurologist. Pt does not like the way cymbalta made her feel so she called about stopping this medication. She presents today wanting to try zoloft. She was on this years ago and remembers that it did help her.    Current Outpatient Prescriptions on File Prior to Visit  Medication Sig Dispense Refill  . ALPRAZolam (XANAX) 0.25 MG tablet Take 1 tablet (0.25 mg total) by mouth 3 (three) times daily as needed for sleep or anxiety (Take 1 tablet by mouth every 8 hours as needed for anxiety).  30 tablet  3  . estradiol (ESTRACE) 1 MG tablet Take 1 tablet (1 mg total) by mouth daily.  90 tablet  3  . MELATONIN PO Take by mouth as needed.      . progesterone (PROMETRIUM) 100 MG capsule Take 1 capsule (100 mg total) by mouth at bedtime.  30 capsule  11  . temazepam (RESTORIL) 30 MG capsule Take 30 mg by mouth daily.      Marland Kitchen triamterene-hydrochlorothiazide (MAXZIDE) 75-50 MG per tablet TAKE 1 TABLET BY MOUTH EVERY DAY  90 tablet  4  . omeprazole (PRILOSEC) 20 MG capsule Take 20 mg by mouth as needed.       No current facility-administered medications on file prior to visit.     Review of Systems  Constitutional: Positive for appetite change and fatigue.  Psychiatric/Behavioral: Positive for decreased concentration and agitation. Negative for suicidal ideas, hallucinations, behavioral problems, confusion and self-injury. The patient is nervous/anxious.        Depression        Objective:   Physical Exam  Constitutional: She is oriented to person, place, and time.  Pleasant 57 y/o female. Appears emotionally distraught. Crying.  Neurological: She is alert and oriented to person, place, and time.  Psychiatric: Her behavior is normal.  Patient is crying throughout visit. Her thoughts are scattered.           Assessment & Plan:

## 2013-03-19 ENCOUNTER — Other Ambulatory Visit: Payer: Self-pay | Admitting: Obstetrics and Gynecology

## 2013-04-10 ENCOUNTER — Ambulatory Visit: Payer: BC Managed Care – PPO | Admitting: Internal Medicine

## 2013-05-05 ENCOUNTER — Other Ambulatory Visit: Payer: Self-pay | Admitting: Internal Medicine

## 2013-05-13 ENCOUNTER — Telehealth: Payer: Self-pay | Admitting: *Deleted

## 2013-05-13 NOTE — Telephone Encounter (Signed)
Patient left message stating her husband has lost his job and she is concerned about being able to afford her medication. Returned patient call no answer on cell phone, left message to call back and will try house phone number.

## 2013-05-13 NOTE — Telephone Encounter (Signed)
Spoke with patient, she went to get her refill for Zoloft and Clonazepam but could not get it because it was too expensive. She is really concerned about not being able to get her medications. Requested samples, informed patient we do not carry those samples in the office she is requesting. Informed to shop around for the best prices and if anyone has a discount card to offer her. Would you know of any suggestions for her?

## 2013-05-13 NOTE — Telephone Encounter (Signed)
Both Zoloft and Clonazepam are generic. I would recommend that she check a few pharmacies. These medications should not be expensive.

## 2013-05-14 NOTE — Telephone Encounter (Signed)
Patient informed and prescription called in to [facility] to [place].

## 2013-05-14 NOTE — Telephone Encounter (Signed)
We can try Alprazolam 0.5 mg po qhs disp #30 no refill.

## 2013-05-14 NOTE — Telephone Encounter (Signed)
Patient would like to know if there is anything else she could be given to help her sleep. She did not sleep at all last night. The Clonazepam was $300.

## 2013-05-15 MED ORDER — ALPRAZOLAM 0.5 MG PO TBDP: 0.5000 mg | ORAL_TABLET | Freq: Every evening | ORAL | Status: DC | PRN

## 2013-06-18 ENCOUNTER — Other Ambulatory Visit: Payer: Self-pay | Admitting: Internal Medicine

## 2013-06-18 NOTE — Telephone Encounter (Signed)
Ok refill? 

## 2013-07-13 ENCOUNTER — Ambulatory Visit: Payer: BC Managed Care – PPO | Admitting: Adult Health

## 2013-07-15 ENCOUNTER — Encounter: Payer: Self-pay | Admitting: Internal Medicine

## 2013-07-15 ENCOUNTER — Ambulatory Visit (INDEPENDENT_AMBULATORY_CARE_PROVIDER_SITE_OTHER): Payer: BC Managed Care – PPO | Admitting: Internal Medicine

## 2013-07-15 ENCOUNTER — Telehealth: Payer: Self-pay | Admitting: Internal Medicine

## 2013-07-15 VITALS — BP 116/70 | HR 80 | Temp 97.9°F | Resp 18

## 2013-07-15 DIAGNOSIS — E876 Hypokalemia: Secondary | ICD-10-CM

## 2013-07-15 DIAGNOSIS — R635 Abnormal weight gain: Secondary | ICD-10-CM

## 2013-07-15 DIAGNOSIS — N3941 Urge incontinence: Secondary | ICD-10-CM

## 2013-07-15 DIAGNOSIS — F3289 Other specified depressive episodes: Secondary | ICD-10-CM

## 2013-07-15 DIAGNOSIS — F411 Generalized anxiety disorder: Secondary | ICD-10-CM

## 2013-07-15 DIAGNOSIS — F329 Major depressive disorder, single episode, unspecified: Secondary | ICD-10-CM

## 2013-07-15 LAB — BASIC METABOLIC PANEL
BUN: 17 mg/dL (ref 6–23)
CO2: 31 mEq/L (ref 19–32)
Calcium: 9.5 mg/dL (ref 8.4–10.5)
Chloride: 98 mEq/L (ref 96–112)
Creatinine, Ser: 0.8 mg/dL (ref 0.4–1.2)
GFR: 77.04 mL/min (ref >60.00)
Glucose, Bld: 90 mg/dL (ref 70–99)
Potassium: 3.7 mEq/L (ref 3.5–5.1)
Sodium: 135 mEq/L (ref 135–145)

## 2013-07-15 LAB — TSH: TSH: 1.23 u[IU]/mL (ref 0.35–5.50)

## 2013-07-15 LAB — MAGNESIUM: Magnesium: 2.1 mg/dL (ref 1.5–2.5)

## 2013-07-15 MED ORDER — SOLIFENACIN SUCCINATE 5 MG PO TABS
5.0000 mg | ORAL_TABLET | Freq: Every day | ORAL | Status: DC
Start: 2013-07-15 — End: 2013-10-15

## 2013-07-15 MED ORDER — SPIRONOLACTONE 50 MG PO TABS: 50.0000 mg | ORAL_TABLET | Freq: Every day | ORAL | Status: DC

## 2013-07-15 NOTE — Telephone Encounter (Signed)
Appointment 08/05/13  Pt aware of appointment

## 2013-07-15 NOTE — Patient Instructions (Addendum)
Trial of once daily vesicare  ofr urinary incontinence  Start with 5 mg dose  We will stop your  Triamterene and change  to spironolactone as your diuretic,  to prevent low potassium. It is a goof fluid pill  I will talk to Dr Gilford Rile about changing your antidepressant to wellbutrin

## 2013-07-15 NOTE — Telephone Encounter (Signed)
Pt saw dr Derrel Nip today.  Dr Derrel Nip wanted her to come back to see you in 2 weeks or sooner for 30 min appointment.  Please advise date and time i can put patient

## 2013-07-15 NOTE — Progress Notes (Signed)
Patient ID: Carla Fuentes, female   DOB: Mar 07, 1956, 58 y.o.   MRN: [id number]   Patient Active Problem List   Diagnosis Date Noted  . Urinary incontinence, urge 07/18/2013  . Intention tremor 01/26/2013  . Insomnia 09/24/2012  . Other and unspecified hyperlipidemia 08/28/2012  . Obesity (BMI 30-39.9) 08/28/2012  . ANXIETY 05/19/2010  . DEPRESSION 05/19/2010  . HYPERTENSION 05/19/2010  . GERD 05/19/2010  . FATIGUE 05/19/2010    Subjective:  CC:   Chief Complaint  Patient presents with  . Acute Visit    pain behind ear lobe  . Cough    non productive    HPI:   Carla Fuentes is a 58 y.o. female who presents for Acute visit for URI symptoms.  Patient reports recent onset of nonproductive cough accompanied by right ear pain. . Both are resolving spontaneously.  Multiple other issues brought up at visit "becuase I can't get in to see Dr. Gilford Rile" (Review of chart notes multiple advices for follow up in the last year )   .  1) Urge incontinence worsening,  now coughing and leaking . No prior trial of medications.   2) Wt gain of 40 lbs after losing 30 lbs on a nationallye sponsored progam.  Has started a natural suppplement TruFix by [employer] .   Feels good on it.  Denies tachycardia, jitteriness and insomnia.   3) She has chronic fluid retention managed with maxide but has had recurrent hypokalemia . Has had 2 ER visits for hypokalemia.  Last one resulted in a 3 day hospitalization in October for mental status changes. Told she had a TIA with changes noted on MRI   4) Persistent depression, lack of concentration,.  Started on zoloft by NP Rey.  Doesn't feel is is working.  Did not follow up with Dr. Gilford Rile.  Patient wants to retry wellbutrin but states that Dr. Gilford Rile stopped this medication., for unclear reasons   Past Medical History  Diagnosis Date  . Hypertension     started on medication for LEE  . Anxiety   . Depression   . GERD (gastroesophageal reflux  disease)   . Abnormal Pap smear   . Basal cell adenocarcinoma     skin, face    Past Surgical History  Procedure Laterality Date  . Oophorectomy  2001    for recurrent ovarian cyst  . Cesarean section      2, FTP with fetal distress  . Cervical biopsy  w/ loop electrode excision         The following portions of the patient's history were reviewed and updated as appropriate: Allergies, current medications, and problem list.    Review of Systems:   Patient denies headache, fevers, malaise, unintentional weight loss, skin rash, eye pain, sinus congestion and sinus pain, sore throat, dysphagia,  hemoptysis , cough, dyspnea, wheezing, chest pain, palpitations, orthopnea, edema, abdominal pain, nausea, melena, diarrhea, constipation, flank pain, dysuria, hematuria, urinary  Frequency, nocturia, numbness, tingling, seizures,  Focal weakness, Loss of consciousness,  Tremor, insomnia, depression, anxiety, and suicidal ideation.     History   Social History  . Marital Status: Married    Spouse Name: N/A    Number of Children: N/A  . Years of Education: N/A   Occupational History  .      [employer] director   Social History Main Topics  . Smoking status: Never Smoker   . Smokeless tobacco: Never Used  . Alcohol Use: 1.0 oz/week  2 drink(s) per week     Comment: few times per month  . Drug Use: No  . Sexual Activity: Yes    Partners: Male     Comment: husband vasectomy   Other Topics Concern  . Not on file   Social History Narrative   Lives with husband, remarried. 2 children, 33YO and 35YO, one child lives [place].      Work - Public librarian      Diet - regular diet      Exercise - works out 3-4 times per week    Objective:  Filed Vitals:   07/15/13 1101  BP: 116/70  Pulse: 80  Temp: 97.9 F (36.6 C)  Resp: 18     General appearance: alert, cooperative and appears stated age Ears: normal TM's and external ear canals both ears Throat:  lips, mucosa, and tongue normal; teeth and gums normal Neck: no adenopathy, no carotid bruit, supple, symmetrical, trachea midline and thyroid not enlarged, symmetric, no tenderness/mass/nodules Back: symmetric, no curvature. ROM normal. No CVA tenderness. Lungs: clear to auscultation bilaterally Heart: regular rate and rhythm, S1, S2 normal, no murmur, click, rub or gallop Abdomen: soft, non-tender; bowel sounds normal; no masses,  no organomegaly Pulses: 2+ and symmetric Skin: Skin color, texture, turgor normal. No rashes or lesions Lymph nodes: Cervical, supraclavicular, and axillary nodes normal.  Assessment and Plan:  DEPRESSION Started on Zoloft 50 mg daily by NP Rey in November after not tolerating change from Prozac to Cymbalta prescribed by Wells Guiles Tat for concurrent headaches during referral for evaluation of tremor in October. And encouraged to resume exercise and follow up in 4 weeks, which she did not do.  Discussed medication to  wellbutrin as I cannot find a contraindication in her chart  Urinary incontinence, urge Trial of vesicare,  5 and 10 mg samples given.   ANXIETY Recent admission for slurred speech in September was considered stress reaction vs somatization, NOT a TIA per Dr Doristine Devoid evaluation, but patient does not agree .  CT Head, ECHO carotid dopplers and MRI were nrgative for acute CVA.   She has had no improvement in axiety with zoloft started in November by Raquel but has not had dose increased beyond 50 mg and has nto seen her PCP Walker yet.   Wellbutrin may not be a better choice for her depression  bc of its tendency to increase agitation .  Will have her increase zoloft to 100 mg instead.   A total of 40 minutes was spent with patient more than half of which was spent in counseling, reviewing records from other prviders and coordination of care.  Updated Medication List Outpatient Encounter Prescriptions as of 07/15/2013  Medication Sig  . ALPRAZolam (NIRAVAM)  0.5 MG dissolvable tablet Take 1 tablet (0.5 mg total) by mouth at bedtime as needed for anxiety.  . ALPRAZolam (XANAX) 0.25 MG tablet Take 1 tablet (0.25 mg total) by mouth 3 (three) times daily as needed for sleep or anxiety (Take 1 tablet by mouth every 8 hours as needed for anxiety).  Marland Kitchen estradiol (ESTRACE) 1 MG tablet Take 1 tablet (1 mg total) by mouth daily.  . progesterone (PROMETRIUM) 100 MG capsule Take 1 capsule (100 mg total) by mouth at bedtime.  . sertraline (ZOLOFT) 50 MG tablet Take 1 tablet (50 mg total) by mouth daily.  . temazepam (RESTORIL) 30 MG capsule TAKE 1 CAPSULE AT BEDTIME AS NEEDED INSOMNIA  . [DISCONTINUED] triamterene-hydrochlorothiazide (MAXZIDE) 75-50 MG per tablet  TAKE 1 TABLET BY MOUTH EVERY DAY  . eszopiclone (LUNESTA) 2 MG TABS tablet TAKE 1 TABLET AT BEDTIME  . MELATONIN PO Take by mouth as needed.  Marland Kitchen omeprazole (PRILOSEC) 20 MG capsule Take 20 mg by mouth as needed.  . solifenacin (VESICARE) 5 MG tablet Take 1 tablet (5 mg total) by mouth daily.  Marland Kitchen spironolactone (ALDACTONE) 50 MG tablet Take 1 tablet (50 mg total) by mouth daily. As needed for fluid retention     Orders Placed This Encounter  Procedures  . Basic metabolic panel  . Magnesium  . TSH    No Follow-up on file.

## 2013-07-15 NOTE — Telephone Encounter (Signed)
Please advise an appointment time for follow up

## 2013-07-15 NOTE — Progress Notes (Signed)
Pre-visit discussion using our clinic review tool. No additional management support is needed unless otherwise documented below in the visit note.  

## 2013-07-15 NOTE — Telephone Encounter (Signed)
It will have to be longer than 2 weeks, as she must have 32min visit.

## 2013-07-17 ENCOUNTER — Encounter: Payer: Self-pay | Admitting: *Deleted

## 2013-07-18 ENCOUNTER — Encounter: Payer: Self-pay | Admitting: Internal Medicine

## 2013-07-18 DIAGNOSIS — N3941 Urge incontinence: Secondary | ICD-10-CM | POA: Insufficient documentation

## 2013-07-18 NOTE — Assessment & Plan Note (Addendum)
Recent admission for slurred speech in September was considered stress reaction vs somatization, NOT a TIA per Dr Doristine Devoid evaluation, but patient does not agree .  CT Head, ECHO carotid dopplers and MRI were nrgative for acute CVA.   She has had no improvement in axiety with zoloft started in November by Raquel but has not had dose increased beyond 50 mg and has nto seen her PCP Walker yet.   Wellbutrin may not be a better choice for her depression  bc of its tendency to increase agitation .  Will have her increase zoloft to 100 mg instead.

## 2013-07-18 NOTE — Assessment & Plan Note (Addendum)
Started on Zoloft 50 mg daily by NP Rey in November after not tolerating change from Prozac to Cymbalta prescribed by Wells Guiles Tat for concurrent headaches during referral for evaluation of tremor in October. And encouraged to resume exercise and follow up in 4 weeks, which she did not do.  Discussed medication to  wellbutrin as I cannot find a contraindication in her chart

## 2013-07-18 NOTE — Assessment & Plan Note (Signed)
Trial of vesicare,  5 and 10 mg samples given.

## 2013-07-20 ENCOUNTER — Telehealth: Payer: Self-pay | Admitting: Obstetrics & Gynecology

## 2013-07-20 NOTE — Telephone Encounter (Signed)
Pt wants to talk with the nurse about getting some diet pills. Pt states she already talked to her doctor concerning this.

## 2013-07-20 NOTE — Telephone Encounter (Signed)
Spoke with pt about the possibility of restarting phentermine and topamax like she took 3 years ago through a wt loss clinic. Pt has discussed it with Dr. Sabra Heck before and she would rather work with SM instead of a wt loss clinic because it was more costly. Advised pt that Dr. Sabra Heck would need to see her before starting a new med as we do not have a recent weight for her.  Pt was seen by Dr. Derrel Nip last week per [facility] and "weighed 195" and was told her BP "was as good as a high schooler." Pt last seen by CR 01-02-13 for Aex.   Dr. Sabra Heck, should I make pt an appt to see you to start these meds? She seemed to think she wouldn't need one and I told her I would send message to you and get back with her.

## 2013-07-20 NOTE — Telephone Encounter (Signed)
I feel she will be best followed at a weight loss clinic.  That is really not my specialty area.

## 2013-07-21 NOTE — Telephone Encounter (Signed)
LM on pt's VM confirming name that SM would rather she be followed at a wt loss clinic, as this is not her specialty area.  Pt to call back with any questions.

## 2013-07-24 ENCOUNTER — Other Ambulatory Visit: Payer: Self-pay | Admitting: Internal Medicine

## 2013-08-05 ENCOUNTER — Ambulatory Visit: Payer: BC Managed Care – PPO | Admitting: Internal Medicine

## 2013-08-25 ENCOUNTER — Ambulatory Visit: Payer: BC Managed Care – PPO | Admitting: Internal Medicine

## 2013-09-14 ENCOUNTER — Ambulatory Visit (INDEPENDENT_AMBULATORY_CARE_PROVIDER_SITE_OTHER): Payer: BC Managed Care – PPO | Admitting: Internal Medicine

## 2013-09-14 ENCOUNTER — Encounter: Payer: Self-pay | Admitting: Internal Medicine

## 2013-09-14 VITALS — BP 118/76 | HR 68 | Temp 98.4°F

## 2013-09-14 DIAGNOSIS — F32A Depression, unspecified: Secondary | ICD-10-CM

## 2013-09-14 DIAGNOSIS — J309 Allergic rhinitis, unspecified: Secondary | ICD-10-CM

## 2013-09-14 DIAGNOSIS — F3289 Other specified depressive episodes: Secondary | ICD-10-CM

## 2013-09-14 DIAGNOSIS — F329 Major depressive disorder, single episode, unspecified: Secondary | ICD-10-CM

## 2013-09-14 NOTE — Patient Instructions (Addendum)
Allergic Rhinitis Allergic rhinitis is when the mucous membranes in the nose respond to allergens. Allergens are particles in the air that cause your body to have an allergic reaction. This causes you to release allergic antibodies. Through a chain of events, these eventually cause you to release histamine into the blood stream. Although meant to protect the body, it is this release of histamine that causes your discomfort, such as frequent sneezing, congestion, and an itchy, runny nose.  CAUSES  Seasonal allergic rhinitis (hay fever) is caused by pollen allergens that may come from grasses, trees, and weeds. Year-round allergic rhinitis (perennial allergic rhinitis) is caused by allergens such as house dust mites, pet dander, and mold spores.  SYMPTOMS   Nasal stuffiness (congestion).  Itchy, runny nose with sneezing and tearing of the eyes. DIAGNOSIS  Your health care provider can help you determine the allergen or allergens that trigger your symptoms. If you and your health care provider are unable to determine the allergen, skin or blood testing may be used. TREATMENT  Allergic Rhinitis does not have a cure, but it can be controlled by:  Medicines and allergy shots (immunotherapy).  Avoiding the allergen. Hay fever may often be treated with antihistamines in pill or nasal spray forms. Antihistamines block the effects of histamine. There are over-the-counter medicines that may help with nasal congestion and swelling around the eyes. Check with your health care provider before taking or giving this medicine.  If avoiding the allergen or the medicine prescribed do not work, there are many new medicines your health care provider can prescribe. Stronger medicine may be used if initial measures are ineffective. Desensitizing injections can be used if medicine and avoidance does not work. Desensitization is when a patient is given ongoing shots until the body becomes less sensitive to the allergen.  Make sure you follow up with your health care provider if problems continue. HOME CARE INSTRUCTIONS It is not possible to completely avoid allergens, but you can reduce your symptoms by taking steps to limit your exposure to them. It helps to know exactly what you are allergic to so that you can avoid your specific triggers. SEEK MEDICAL CARE IF:   You have a fever.  You develop a cough that does not stop easily (persistent).  You have shortness of breath.  You start wheezing.  Symptoms interfere with normal daily activities. Document Released: 01/16/2001 Document Revised: 02/11/2013 Document Reviewed: 12/29/2012 Exit[employer] Patient Information 2014 [employer].  

## 2013-09-14 NOTE — Progress Notes (Signed)
Subjective:    Patient ID: Carla Fuentes, female    DOB: 07-04-1955, 58 y.o.   MRN: [id number]  HPI  Pt presents to the clinic today with c/o shortness of breath, fatigue, body aches and diarrhea. She reports this started 1-2 weeks ago. She has had some associated nasal congestion and cough. The cough is productive of clear mucous. She denies fever, chills or body aches. She has taken alka seltzer and zyrtec.  Additionally, she reports that she wants to stop her zoloft. She is on 25 mg daily.  Review of Systems      Past Medical History  Diagnosis Date  . Hypertension     started on medication for LEE  . Anxiety   . Depression   . GERD (gastroesophageal reflux disease)   . Abnormal Pap smear   . Basal cell adenocarcinoma     skin, face    Current Outpatient Prescriptions  Medication Sig Dispense Refill  . ALPRAZolam (NIRAVAM) 0.5 MG dissolvable tablet Take 1 tablet (0.5 mg total) by mouth at bedtime as needed for anxiety.  30 tablet  0  . ALPRAZolam (XANAX) 0.25 MG tablet Take 1 tablet (0.25 mg total) by mouth 3 (three) times daily as needed for sleep or anxiety (Take 1 tablet by mouth every 8 hours as needed for anxiety).  30 tablet  3  . estradiol (ESTRACE) 1 MG tablet Take 1 tablet (1 mg total) by mouth daily.  90 tablet  3  . eszopiclone (LUNESTA) 2 MG TABS tablet TAKE 1 TABLET AT BEDTIME  30 tablet  0  . MELATONIN PO Take by mouth as needed.      Marland Kitchen omeprazole (PRILOSEC) 20 MG capsule Take 20 mg by mouth as needed.      . progesterone (PROMETRIUM) 100 MG capsule Take 1 capsule (100 mg total) by mouth at bedtime.  30 capsule  11  . sertraline (ZOLOFT) 50 MG tablet TAKE 1/2 TABLET BY MOUTH EVERY DAY      . solifenacin (VESICARE) 5 MG tablet Take 1 tablet (5 mg total) by mouth daily.  14 tablet  0  . spironolactone (ALDACTONE) 50 MG tablet Take 1 tablet (50 mg total) by mouth daily. As needed for fluid retention  30 tablet  1  . temazepam (RESTORIL) 30 MG capsule TAKE 1  CAPSULE AT BEDTIME AS NEEDED INSOMNIA  30 capsule  3   No current facility-administered medications for this visit.    Allergies  Allergen Reactions  . Penicillins     Family History  Problem Relation Age of Onset  . Diabetes Mother   . Hypertension Mother   . Heart disease Father   . Diabetes Brother     History   Social History  . Marital Status: Married    Spouse Name: N/A    Number of Children: N/A  . Years of Education: N/A   Occupational History  .      [employer] director   Social History Main Topics  . Smoking status: Never Smoker   . Smokeless tobacco: Never Used  . Alcohol Use: 1.0 oz/week    2 drink(s) per week     Comment: occasional  . Drug Use: No  . Sexual Activity: Yes    Partners: Male     Comment: husband vasectomy   Other Topics Concern  . Not on file   Social History Narrative   Lives with husband, remarried. 2 children, 33YO and 35YO, one child lives [place].  Work - Public librarian      Diet - regular diet      Exercise - works out 3-4 times per week     Constitutional: Pt reports fatigue. Denies fever, malaise, headache or abrupt weight changes.  HEENT: Pt reports nasal congestion and sore throat. Denies eye pain, eye redness, ear pain, ringing in the ears, wax buildup, runny nose, bloody nose. Respiratory: Pt reports shortness of breath. Denies difficulty breathing, cough or sputum production.   Cardiovascular: Denies chest pain, chest tightness, palpitations or swelling in the hands or feet.  Gastrointestinal: Pt reports diarrhea. Denies abdominal pain, bloating, constipation, diarrhea or blood in the stool.  Psych: Pt denies anxiety, depression, SI/HI.  No other specific complaints in a complete review of systems (except as listed in HPI above).  Objective:   Physical Exam   BP 118/76  Pulse 68  Temp(Src) 98.4 F (36.9 C) (Oral)  LMP 12/06/2011 Wt Readings from Last 3 Encounters:  09/24/12 183 lb (83.008  kg)  08/28/12 181 lb (82.101 kg)  05/19/10 195 lb 1.9 oz (88.505 kg)    General: Appears her stated age, well developed, well nourished in NAD. HEENT: Head: normal shape and size; Eyes: sclera white, no icterus, conjunctiva pink, PERRLA and EOMs intact; Ears: Tm's gray and intact, normal light reflex; Nose: mucosa pink and moist, septum midline; Throat/Mouth: Teeth present, mucosa erythematous and moist, + PND,no exudate, lesions or ulcerations noted.  Cardiovascular: Normal rate and rhythm. S1,S2 noted.  No murmur, rubs or gallops noted. No JVD or BLE edema. No carotid bruits noted. Pulmonary/Chest: Normal effort and positive vesicular breath sounds. No respiratory distress. No wheezes, rales or ronchi noted.  Abdomen: Soft and nontender. Normal bowel sounds, no bruits noted. No distention or masses noted. Liver, spleen and kidneys non palpable. Psychiatric: Mood and affect normal. Behavior is normal. Judgment and thought content normal.     BMET    Component Value Date/Time   NA 135 07/15/2013 1131   K 3.7 07/15/2013 1131   CL 98 07/15/2013 1131   CO2 31 07/15/2013 1131   GLUCOSE 90 07/15/2013 1131   BUN 17 07/15/2013 1131   CREATININE 0.8 07/15/2013 1131   CALCIUM 9.5 07/15/2013 1131    Lipid Panel     Component Value Date/Time   CHOL 200 08/28/2012 1121   TRIG 137.0 08/28/2012 1121   HDL 52.50 08/28/2012 1121   CHOLHDL 4 08/28/2012 1121   VLDL 27.4 08/28/2012 1121   LDLCALC 120* 08/28/2012 1121    CBC    Component Value Date/Time   WBC 7.5 01/26/2013 1352   RBC 4.34 01/26/2013 1352   HGB 13.9 01/26/2013 1352   HCT 40.8 01/26/2013 1352   PLT 220.0 01/26/2013 1352   MCV 94.2 01/26/2013 1352   MCHC 34.1 01/26/2013 1352   RDW 13.4 01/26/2013 1352   LYMPHSABS 1.8 01/26/2013 1352   MONOABS 0.5 01/26/2013 1352   EOSABS 0.1 01/26/2013 1352   BASOSABS 0.0 01/26/2013 1352    Hgb A1C No results found for this basename: HGBA1C        Assessment & Plan:   Allergic Rhinitis:  Continue  zyrtec Recommend flonase OTC as well Watch for fever, chills, colored nasal drainage Delsym OTC for cough  Depression:  Advised her to take the zoloft every other day x 1 week then stop.  RTC as needed

## 2013-09-14 NOTE — Progress Notes (Signed)
Pre visit review using our clinic review tool, if applicable. No additional management support is needed unless otherwise documented below in the visit note. 

## 2013-10-02 ENCOUNTER — Other Ambulatory Visit: Payer: Self-pay | Admitting: Internal Medicine

## 2013-10-12 ENCOUNTER — Other Ambulatory Visit: Payer: Self-pay | Admitting: Internal Medicine

## 2013-10-13 ENCOUNTER — Telehealth: Payer: Self-pay | Admitting: Internal Medicine

## 2013-10-13 NOTE — Telephone Encounter (Signed)
I dont see this medication on her list of medications, please advise

## 2013-10-13 NOTE — Telephone Encounter (Signed)
I have not seen her in almost 1 year. She needs a follow up before deciding about medications. 66min

## 2013-10-13 NOTE — Telephone Encounter (Signed)
The spironolactone (ALDACTONE) 50 MG tablet did not work for the patient and Dr. Gilford Rile put the patient on the triamterene-hydrochlorothiazide (MAXZIDE) 75-50 MG per tablet .She has been 4 days without her fluid pill and she is needing a refill today.

## 2013-10-14 NOTE — Telephone Encounter (Signed)
Notified pt and scheduled appt.

## 2013-10-15 ENCOUNTER — Encounter: Payer: Self-pay | Admitting: Internal Medicine

## 2013-10-15 ENCOUNTER — Ambulatory Visit (INDEPENDENT_AMBULATORY_CARE_PROVIDER_SITE_OTHER): Payer: BC Managed Care – PPO | Admitting: Internal Medicine

## 2013-10-15 VITALS — BP 126/70 | HR 96 | Temp 98.4°F | Ht 64.75 in

## 2013-10-15 DIAGNOSIS — F329 Major depressive disorder, single episode, unspecified: Secondary | ICD-10-CM

## 2013-10-15 DIAGNOSIS — F3289 Other specified depressive episodes: Secondary | ICD-10-CM

## 2013-10-15 DIAGNOSIS — R609 Edema, unspecified: Secondary | ICD-10-CM

## 2013-10-15 MED ORDER — TRIAMTERENE-HCTZ 75-50 MG PO TABS: 1.0000 | ORAL_TABLET | Freq: Every day | ORAL | Status: DC

## 2013-10-15 MED ORDER — SERTRALINE HCL 50 MG PO TABS: 50.0000 mg | ORAL_TABLET | Freq: Every day | ORAL | Status: DC

## 2013-10-15 NOTE — Assessment & Plan Note (Signed)
BLE edema, most consistent with chronic venous insufficiency. Will set up vascular evaluation. Will continue Maxide, but encouraged her to try lower dose 37.5-25mg , with goal of avoid hypokalemia. Encouraged her to limit sodium intake and increase physical activity as well. Encouraged use of compression hose. Will recheck electrolytes and renal function in 6 months.

## 2013-10-15 NOTE — Progress Notes (Signed)
Pre visit review using our clinic review tool, if applicable. No additional management support is needed unless otherwise documented below in the visit note. 

## 2013-10-15 NOTE — Patient Instructions (Signed)
Try taking 1/2 tablet of Maxide daily and monitor symptoms of edema.  Follow up 6 months and prn.

## 2013-10-15 NOTE — Progress Notes (Signed)
Subjective:    Patient ID: Carla Fuentes, female    DOB: [birth year], 58 y.o.   MRN: [id number]  HPI 58YO female presents for follow up.  Edema - Concerned about bilateral LE edema. Was previously taking Triamterene-HCTZ 75-50 with improvement. Was changed to Spironolactone 50mg  daily, but had worsening edema. Would like to start back on previous medication. Has h/o hypokalemia in the past.   Depression - Symptoms well controlled on Sertraline. Tried to taper to 25mg  daily, but developed worsening depressed mood and frequent tearfulness. Now back on 50mg  daily and symptoms well controlled.  Review of Systems  Constitutional: Negative for fever, chills, appetite change, fatigue and unexpected weight change.  HENT: Negative for congestion, ear pain, sinus pressure, sore throat, trouble swallowing and voice change.   Eyes: Negative for visual disturbance.  Respiratory: Negative for cough, shortness of breath, wheezing and stridor.   Cardiovascular: Positive for leg swelling. Negative for chest pain and palpitations.  Gastrointestinal: Negative for nausea, vomiting, abdominal pain, diarrhea, constipation, blood in stool, abdominal distention and anal bleeding.  Genitourinary: Negative for dysuria and flank pain.  Musculoskeletal: Negative for arthralgias, gait problem, myalgias and neck pain.  Skin: Negative for color change and rash.  Neurological: Negative for dizziness and headaches.  Hematological: Negative for adenopathy. Does not bruise/bleed easily.  Psychiatric/Behavioral: Negative for suicidal ideas, sleep disturbance and dysphoric mood. The patient is not nervous/anxious.        Objective:    BP 126/70  Pulse 96  Temp(Src) 98.4 F (36.9 C) (Oral)  Ht 5' 4.75" (1.645 m)  SpO2 95%  LMP 12/06/2011 Physical Exam  Constitutional: She is oriented to person, place, and time. She appears well-developed and well-nourished. No distress.  HENT:  Head: Normocephalic and  atraumatic.  Right Ear: External ear normal.  Left Ear: External ear normal.  Nose: Nose normal.  Mouth/Throat: Oropharynx is clear and moist. No oropharyngeal exudate.  Eyes: Conjunctivae are normal. Pupils are equal, round, and reactive to light. Right eye exhibits no discharge. Left eye exhibits no discharge. No scleral icterus.  Neck: Normal range of motion. Neck supple. No tracheal deviation present. No thyromegaly present.  Cardiovascular: Normal rate, regular rhythm, normal heart sounds and intact distal pulses.  Exam reveals no gallop and no friction rub.   No murmur heard. Pulmonary/Chest: Effort normal and breath sounds normal. No accessory muscle usage. Not tachypneic. No respiratory distress. She has no decreased breath sounds. She has no wheezes. She has no rhonchi. She has no rales. She exhibits no tenderness.  Musculoskeletal: Normal range of motion. She exhibits edema (non-pitting edema noted BLE). She exhibits no tenderness.  Lymphadenopathy:    She has no cervical adenopathy.  Neurological: She is alert and oriented to person, place, and time. No cranial nerve deficit. She exhibits normal muscle tone. Coordination normal.  Skin: Skin is warm and dry. No rash noted. She is not diaphoretic. No erythema. No pallor.  Psychiatric: She has a normal mood and affect. Her behavior is normal. Judgment and thought content normal.          Assessment & Plan:   Problem List Items Addressed This Visit     Unprioritized   DEPRESSION     Symptoms well controlled with Sertraline. Will continue.    Relevant Medications      sertraline (ZOLOFT) tablet   Edema - Primary     BLE edema, most consistent with chronic venous insufficiency. Will set up vascular evaluation. Will continue Maxide, but  encouraged her to try lower dose 37.5-25mg , with goal of avoid hypokalemia. Encouraged her to limit sodium intake and increase physical activity as well. Encouraged use of compression hose. Will  recheck electrolytes and renal function in 6 months.    Relevant Medications      triamterene-hydrochlorothiazide (MAXZIDE) 75-50 MG per tablet   Other Relevant Orders      Ambulatory referral to Vascular Surgery       Return in about 6 months (around 04/16/2014) for Recheck.

## 2013-10-15 NOTE — Assessment & Plan Note (Signed)
Symptoms well controlled with Sertraline. Will continue. 

## 2013-12-03 ENCOUNTER — Other Ambulatory Visit: Payer: Self-pay | Admitting: *Deleted

## 2013-12-03 DIAGNOSIS — R609 Edema, unspecified: Secondary | ICD-10-CM

## 2013-12-03 DIAGNOSIS — I83893 Varicose veins of bilateral lower extremities with other complications: Secondary | ICD-10-CM

## 2013-12-03 DIAGNOSIS — I872 Venous insufficiency (chronic) (peripheral): Secondary | ICD-10-CM

## 2013-12-10 ENCOUNTER — Telehealth: Payer: Self-pay | Admitting: *Deleted

## 2013-12-10 NOTE — Telephone Encounter (Signed)
Pt will need to be seen. I don't prescribe Adderrall to adults without care from psychiatry, so will need to get notes from psychiatry as well.

## 2013-12-10 NOTE — Telephone Encounter (Signed)
Pt called requesting Adderall refill however medication not listed on pts med list.  Pt was last seen on 6.11.15.  I can not reach pt to schedule appointment. Please advise refill

## 2013-12-11 NOTE — Telephone Encounter (Signed)
Spoke with pt scheduled appoint

## 2013-12-18 ENCOUNTER — Encounter: Payer: Self-pay | Admitting: Internal Medicine

## 2013-12-18 ENCOUNTER — Encounter: Payer: Self-pay | Admitting: Surgery

## 2013-12-18 ENCOUNTER — Ambulatory Visit (INDEPENDENT_AMBULATORY_CARE_PROVIDER_SITE_OTHER): Payer: BC Managed Care – PPO | Admitting: Internal Medicine

## 2013-12-18 VITALS — BP 130/74 | HR 83 | Temp 98.1°F | Ht 64.75 in

## 2013-12-18 DIAGNOSIS — R41840 Attention and concentration deficit: Secondary | ICD-10-CM | POA: Insufficient documentation

## 2013-12-18 NOTE — Progress Notes (Signed)
Pre visit review using our clinic review tool, if applicable. No additional management support is needed unless otherwise documented below in the visit note. 

## 2013-12-18 NOTE — Progress Notes (Signed)
   Subjective:    Patient ID: Carla Fuentes, female    DOB: 1955-09-28, 58 y.o.   MRN: [id number]  HPI 58YO female presents for follow up.  States "I want adderall."  Was recently visiting sister and sister felt that she should be on Adderrall. She tried some of her sister's medication. She felt more focused and clear on this medication. She requests adderall Rx. Reports she was on this medication in the past.  Feels this appointment is a "waste of her time" if she cannot get Adderrall.  Would also like to start back on Phentermine.  She would like copay refunded for this visit.  Review of Systems Declined    Objective:    BP 130/74  Pulse 83  Temp(Src) 98.1 F (36.7 C) (Oral)  Ht 5' 4.75" (1.645 m)  SpO2 94%  LMP 12/06/2011 Physical Exam Declined       Assessment & Plan:   Problem List Items Addressed This Visit     Unprioritized   Concentration deficit - Primary     Pt requests Rx for Adderrall. Offered to set up testing for ADHD. Declines referral for this testing. She will contact her previous psychiatrist for Adderrall.        No Follow-up on file.

## 2013-12-18 NOTE — Assessment & Plan Note (Signed)
Pt requests Rx for Adderrall. Offered to set up testing for ADHD. Declines referral for this testing. She will contact her previous psychiatrist for Adderrall.

## 2013-12-21 ENCOUNTER — Encounter: Payer: BC Managed Care – PPO | Admitting: Surgery

## 2013-12-21 ENCOUNTER — Telehealth: Payer: Self-pay | Admitting: Surgery

## 2013-12-21 ENCOUNTER — Encounter (HOSPITAL_COMMUNITY): Payer: BC Managed Care – PPO

## 2013-12-21 ENCOUNTER — Telehealth: Payer: Self-pay | Admitting: *Deleted

## 2013-12-21 NOTE — Telephone Encounter (Signed)
Message copied by Gena Fray on Mon Dec 21, 2013 10:18 AM ------      Message from: Merleen Nicely      Created: Mon Dec 21, 2013 10:15 AM      Regarding: CANCELLATION FOR TODAY       Naya Ilagan cancelled her appointment today. She is sick. She said she would call back to reschedule.            Thanks,      Ebony Hail ------

## 2013-12-21 NOTE — Telephone Encounter (Signed)
No. She has hypertension. This would not be safe for her to take.

## 2013-12-21 NOTE — Telephone Encounter (Signed)
Pt left message stating that she would like to accept the phentermine medication.

## 2013-12-21 NOTE — Telephone Encounter (Signed)
Pt states that she is not on the BP medication for her BP she is on it because of her fluid retention, Pt is requesting for you to call her.

## 2013-12-22 ENCOUNTER — Telehealth: Payer: Self-pay | Admitting: Internal Medicine

## 2013-12-22 ENCOUNTER — Encounter: Payer: Self-pay | Admitting: Internal Medicine

## 2013-12-22 NOTE — Telephone Encounter (Addendum)
Patient dismissed from [facility] by Ronette Deter MD , effective December 22, 2013. Dismissal letter sent out by certified / registered mail. DAJ  Received signed domestic return receipt verifying delivery of certified letter on December 28, 2013. Article number [id number] [phone] DAJ

## 2014-01-10 ENCOUNTER — Other Ambulatory Visit: Payer: Self-pay | Admitting: Internal Medicine

## 2014-01-12 ENCOUNTER — Ambulatory Visit (INDEPENDENT_AMBULATORY_CARE_PROVIDER_SITE_OTHER): Payer: BC Managed Care – PPO | Admitting: Obstetrics & Gynecology

## 2014-01-12 ENCOUNTER — Encounter: Payer: Self-pay | Admitting: Obstetrics & Gynecology

## 2014-01-12 VITALS — BP 120/80 | HR 60 | Resp 16 | Ht 64.25 in | Wt 196.6 lb

## 2014-01-12 DIAGNOSIS — Z01419 Encounter for gynecological examination (general) (routine) without abnormal findings: Secondary | ICD-10-CM

## 2014-01-12 DIAGNOSIS — Z124 Encounter for screening for malignant neoplasm of cervix: Secondary | ICD-10-CM

## 2014-01-12 DIAGNOSIS — Z0000 Encounter for general adult medical examination without abnormal findings: Secondary | ICD-10-CM

## 2014-01-12 LAB — POCT URINALYSIS DIPSTICK
Bilirubin, UA: NEGATIVE
Blood, UA: NEGATIVE
Glucose, UA: NEGATIVE
Ketones, UA: NEGATIVE
Nitrite, UA: NEGATIVE
Urobilinogen, UA: NEGATIVE
pH, UA: 5

## 2014-01-12 LAB — HEMOGLOBIN, FINGERSTICK: Hemoglobin, fingerstick: 14.2 g/dL (ref 12.0–16.0)

## 2014-01-12 MED ORDER — ESTRADIOL 1 MG PO TABS: 1.0000 mg | ORAL_TABLET | Freq: Every day | ORAL | Status: DC

## 2014-01-12 MED ORDER — TEMAZEPAM 30 MG PO CAPS: ORAL_CAPSULE | ORAL | Status: AC

## 2014-01-12 MED ORDER — ALPRAZOLAM 0.25 MG PO TABS: 0.2500 mg | ORAL_TABLET | Freq: Three times a day (TID) | ORAL | Status: DC | PRN

## 2014-01-12 MED ORDER — PROGESTERONE MICRONIZED 100 MG PO CAPS: 100.0000 mg | ORAL_CAPSULE | Freq: Every day | ORAL | Status: DC

## 2014-01-12 NOTE — Progress Notes (Signed)
58 y.o. [id number] MarriedCaucasianF here for annual exam.  Had episode last October of slurred speech, lip numbness, and then pre-syncopal episode.  Pt reports had lots of issues with stress last year so she quit job in March.  Still on HRT.  Never really addressed this with neurologist.  Reviewed note from Dr. Carles Collet, Neurologist at [facility].    Patient's last menstrual period was 12/06/2011.          Sexually active: Yes.    The current method of family planning is post menopausal status.    Exercising: Yes.     Smoker:  no  Health Maintenance: Pap:  11/01/10 WNL  History of abnormal Pap:  yes MMG:  Per patient 2014 at [facility] Colonoscopy:  4/11-repeat in 5 years BMD:   5/11 TDaP:  01/21/13 Screening Labs: will obtain outside records., Hb today: 14.2, Urine today: WBC-trace, PROTEIN-trace   reports that she has never smoked. She has never used smokeless tobacco. She reports that she drinks about .5 ounces of alcohol per week. She reports that she does not use illicit drugs.  Past Medical History  Diagnosis Date  . Hypertension     started on medication for LEE  . Anxiety   . Depression   . GERD (gastroesophageal reflux disease)   . Abnormal Pap smear   . Basal cell adenocarcinoma     skin, face  . TIA (transient ischemic attack)     ? if TIA or anxiety-was hospitalized x 3 days, normal MRI and CT    Past Surgical History  Procedure Laterality Date  . Oophorectomy  2001    for recurrent ovarian cyst  . Cesarean section      2, FTP with fetal distress  . Cervical biopsy  w/ loop electrode excision      Current Outpatient Prescriptions  Medication Sig Dispense Refill  . ALPRAZolam (XANAX) 0.25 MG tablet Take 1 tablet (0.25 mg total) by mouth 3 (three) times daily as needed for sleep or anxiety (Take 1 tablet by mouth every 8 hours as needed for anxiety).  30 tablet  3  . estradiol (ESTRACE) 1 MG tablet Take 1 tablet (1 mg total) by mouth daily.  90 tablet  3   . progesterone (PROMETRIUM) 100 MG capsule Take 1 capsule (100 mg total) by mouth at bedtime.  30 capsule  11  . sertraline (ZOLOFT) 50 MG tablet Take 1 tablet (50 mg total) by mouth daily.  90 tablet  3  . temazepam (RESTORIL) 30 MG capsule TAKE 1 CAPSULE AT BEDTIME AS NEEDED INSOMNIA  30 capsule  3  . triamterene-hydrochlorothiazide (MAXZIDE) 75-50 MG per tablet Take 1 tablet by mouth daily.  90 tablet  3   No current facility-administered medications for this visit.    Family History  Problem Relation Age of Onset  . Diabetes Mother   . Hypertension Mother   . Heart disease Father   . Diabetes Brother     ROS:  Pertinent items are noted in HPI.  Otherwise, a comprehensive ROS was negative.  Exam:   BP 120/80  Pulse 60  Resp 16  Ht 5' 4.25" (1.632 m)  Wt 196 lb 9.6 oz (89.177 kg)  BMI 33.48 kg/m2  LMP 12/06/2011   Height: 5' 4.25" (163.2 cm)  Ht Readings from Last 3 Encounters:  01/12/14 5' 4.25" (1.632 m)  12/18/13 5' 4.75" (1.645 m)  10/15/13 5' 4.75" (1.645 m)    General appearance: alert, cooperative and appears  stated age Head: Normocephalic, without obvious abnormality, atraumatic Neck: no adenopathy, supple, symmetrical, trachea midline and thyroid normal to inspection and palpation Lungs: clear to auscultation bilaterally Breasts: normal appearance, no masses or tenderness Heart: regular rate and rhythm Abdomen: soft, non-tender; bowel sounds normal; no masses,  no organomegaly Extremities: extremities normal, atraumatic, no cyanosis or edema Skin: Skin color, texture, turgor normal. No rashes or lesions Lymph nodes: Cervical, supraclavicular, and axillary nodes normal. No abnormal inguinal nodes palpated Neurologic: Grossly normal   Pelvic: External genitalia:  no lesions              Urethra:  normal appearing urethra with no masses, tenderness or lesions              Bartholins and Skenes: normal                 Vagina: normal appearing vagina with  normal color and discharge, no lesions              Cervix: no lesions              Pap taken: Yes.   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  Neurology follow up states probable anxiety.    P: Mammogram yearly counseled on breast self exam, mammography screening, adequate intake of calcium and vitamin D, diet and exercise  Will obtained records from [facility] and from [facility] regarding hospitalization and mammogram.    An After Visit Summary was printed and given to the patient.

## 2014-01-18 ENCOUNTER — Other Ambulatory Visit: Payer: Self-pay | Admitting: *Deleted

## 2014-01-18 ENCOUNTER — Telehealth: Payer: Self-pay

## 2014-01-18 MED ORDER — PROGESTERONE MICRONIZED 100 MG PO CAPS: 100.0000 mg | ORAL_CAPSULE | Freq: Every day | ORAL | Status: DC

## 2014-01-18 MED ORDER — ESTRADIOL 1 MG PO TABS: 1.0000 mg | ORAL_TABLET | Freq: Every day | ORAL | Status: DC

## 2014-01-18 NOTE — Telephone Encounter (Signed)
Rx done for HRT for 90 day supply/1 yr RF.  If she wants 30 days supply, she pharmacy can switch that for her.  I do not understand the issue with the other prescriptions as the rx has my name, address, and DEA # on it.  She will call back if there continue to be issues, I am sure.

## 2014-01-18 NOTE — Telephone Encounter (Signed)
Left message to call Levetta Bognar at [phone]. 

## 2014-01-18 NOTE — Telephone Encounter (Signed)
Spoke with patient. Advised of message as seen below. Patient is agreeable. Patient states she will need refill for Progesterone 100 mg and Estrace 1 mg as she only got one month. Patient would like to know if Dr.Miller's name is on the prescriptions for xanax and restoril. States that pharmacy declined to fill the rx's because "They said I am no longer a patient of the practice. I don't know why they would have called my primary care if Dr.Miller's name was on the prescription." Advised patient they may not have realized and contacted the wrong provided. Advised to call over to speak with the pharmacy and if any problems to call back so we can call in prescriptions for her. Patient is agreeable.   Dr.Miller, okay to refill rx for estrace and progesterone until next aex?

## 2014-01-18 NOTE — Telephone Encounter (Signed)
Message copied by Jasmine Awe on Mon Jan 18, 2014  8:56 AM ------      Message from: Megan Salon      Created: Sun Jan 17, 2014  5:37 PM      Regarding: HRT question       Please call pt.  Got records from [facility] from last year.  Pt reported at [facility] last week she might have had a TIA.  Review of records does not indicate this to be so.  Neurologist here in [place] also did not think this was a TIA.  OK to continue HRT.  Rx have been given.            I am still working on a PCP name for.  I reached out to Dr. Arline Asp who suggested Dr. Einar Pheasant but she is in same practice as Dr. Ronette Deter and pt was discharged from that practice recently.  She is aware of this.  I will get back with her about a PCP.  Just wanted to give her an update.            MSM ------

## 2014-01-18 NOTE — Telephone Encounter (Addendum)
Incoming fax from [facility] requesting refills on progesterone Rx.  Last AEX and refill 01/12/14 #30/ 0 Refills  Addressed on telephone encounter from 9/14 by [facility].

## 2014-01-19 ENCOUNTER — Telehealth: Payer: Self-pay

## 2014-01-19 LAB — IPS PAP TEST WITH HPV

## 2014-01-19 NOTE — Telephone Encounter (Signed)
Patient aware, will call Dr Vira Agar. Will also call for appointment with recommended PCP//kn

## 2014-01-19 NOTE — Telephone Encounter (Signed)
I did not tell her she was dehydrated and her urine test does not indicate that.  With such a change in BM, should see GI.   PCP suggestion--Carla Fuentes at [facility].  She should call for the appointment.

## 2014-01-19 NOTE — Telephone Encounter (Signed)
Spoke with patient re: MMG and most recent one we received was 01/03/12-so needs one now. Offered to make appointment for patient, but states she will call.  Patient is c/o being told she was dehydrated at her AEX. And after her exam, she was constipated, bloated, and had extreme heartburn/belching x 4 days. States will have pain at night when she turns over that will wake her. Yesterday, 01/18/14, she did have a  bowel movement that was very painful and her stool was white and chunky. Her GI is Dr Tiffany Kocher. Please advise if we need to see her or should she start with GI?

## 2014-01-19 NOTE — Telephone Encounter (Signed)
Return call to Kaitlyn. °

## 2014-01-20 NOTE — Telephone Encounter (Signed)
Spoke with patient. Advised of refills sent in by [facility] as seen below. Patient is agreeable. Patient would like to know pap results. Advised pap was normal. Patient is agreeable. Pap results seen below.  Notes Recorded by Lyman Speller, MD on 01/20/2014 at 7:30 AM 02 recall  Routing to provider for final review. Patient agreeable to disposition. Will close encounter

## 2014-02-03 ENCOUNTER — Other Ambulatory Visit: Payer: Self-pay

## 2014-02-03 NOTE — Telephone Encounter (Signed)
Last AEX: 01/12/14 Last refill:01/18/14 #90 X 4 Current AEX:02/18/15  Pt was given new rx on 01/18/14 #90 X 4

## 2014-03-08 ENCOUNTER — Encounter: Payer: Self-pay | Admitting: Obstetrics & Gynecology

## 2014-08-27 NOTE — Discharge Summary (Signed)
PATIENT NAME:  Carla, Fuentes MR#:  [id number] DATE OF BIRTH:  1955-11-27  DATE OF ADMISSION:  01/08/2013 DATE OF DISCHARGE:  01/09/2013   ADMITTING DIAGNOSIS: Slurred speech, fall.   DISCHARGE DIAGNOSES: 1.  Slurred speech, gait instability of unclear etiology. The patient's symptoms were very atypical. Possibly anxiety-induced. Her carotid Dopplers and MRI are nonrevealing. Echocardiogram is negative.  2.  Left ear pain, ringing, possible mild otitis media. The patient was treated with ear drops.  3.  Generalized anxiety.  4.  Hypertension.  5.  Hypokalemia as a result of diuretic therapy.   PERTINENT LABORATORIES AND EVALUATIONS: Admitting glucose 79, BUN 16, creatinine 1.04, sodium 136, potassium 3.1, chloride 100; CO2 was 21; calcium was 9.4. LFTs were normal. WBC 10.3, hemoglobin 13.9, platelet count 241.   EKG: Normal sinus rhythm. No ST-T wave changes.   CT scan of the head shows no acute abnormality. Echocardiogram of the heart shows no evidence of patent foramen ovale, no evidence of atrioseptal defects, trivial tricuspid regurgitation, normal EF. MRI of the brain showed no acute abnormality. Bilateral carotid Dopplers showed no significant carotid atherosclerotic disease.   Lipid panel: Total cholesterol 202, triglycerides 94, HDL 59; LDL was 124.   HOSPITAL COURSE: Please refer to H and P done by the admitting physician. The patient is a 59 year old white female who presented with complaint of slurred speech and episode of fall. She has had a lot of stress at home and came in with these symptoms. Therefore, we were asked to admit the patient. She was admitted for a possible TIA. Her symptoms were very inconsistent in her presentation. However, she underwent a work-up for TIA/CVA. The work-up reveals no evidence of carotid artery disease. Her MRI was negative. Her echocardiogram was nonrevealing. I suspect a lot of her symptoms may be anxiety driven. She was also complaining of  ringing in the ear. She also complains of swelling in her lower extremity. For her ear, she may have possible, very minimal otitis media; therefore, she was started on ear drops. She wants vascular evaluation for her chronic intermittent lower extremity swelling. I will make an outpatient referral for ABIs. At this time, she is stable and okay for discharge to home.   DISCHARGE DIET: Low-sodium, low-fat, low-cholesterol.   ACTIVITY: As tolerated.   FOLLOWUP: With primary MD in 1 to 2 weeks. Follow up with Dr. Lucky Cowboy in 2 to 4 weeks for claudication symptoms.   DISCHARGE MEDICATIONS: Aspirin 325 mg p.o. daily, bupropion 150 daily, ciprofloxacin otic solution 0.2 one application to affected ear every 12 hours for the next 5 days, estradiol 1 mg daily, potassium 20 mEq daily, Maxzide 50/75 p.o. daily, progesterone 100 at bedtime, Prozac 40 daily, Restoril 30 at bedtime.   NOTE: 32 minutes spent on the discharge.  ____________________________ Lafonda Mosses. Posey Pronto, MD shp:jm D: 01/09/2013 14:30:33 ET T: 01/09/2013 16:29:39 ET JOB#: [id number]  cc: Renato Spellman H. Posey Pronto, MD, <Dictator> Alric Seton MD ELECTRONICALLY SIGNED 01/17/2013 14:16

## 2014-08-27 NOTE — H&P (Signed)
PATIENT NAME:  Carla Fuentes, Carla Fuentes MR#:  [id number] DATE OF BIRTH:  Sep 28, 1955  DATE OF ADMISSION:  01/08/2013  PRIMARY CARE PHYSICIAN:  Ronette Deter, M.D.   REFERRING PHYSICIAN:  Dr. Owens Shark.   CHIEF COMPLAINT:  Slurry speech.   HISTORY OF PRESENT ILLNESS:  The patient is a 59 year old female with a past medical history of hypertension and anxiety, is presenting to the ER with a chief complaint of waxing and waning episodes of slurry speech.  The patient has reported that the day prior to the admission she started having headache at around 7:00 a.m.  Eventually she has noticed slurry speech.  The patient also noticed the shaking in her right hand.  She has reported that she was noticing episodes of slurry speech intermittently since summer which is resolving on its own.  Yesterday, the patient was worried as she had slurry speech associated with right hand shaking and came into the ER.  Denies any blurry vision, difficulty in swallowing.  No weakness in her upper or lower extremities.  No similar complaints in the past.  CAT scan of the head is negative.  Hospitalist team is called to admit the patient.  The patient has reported that she has chronic history of anxiety and lately she has been experiencing a lot of stress.  She was evaluated by her primary care physician for anxiety and has been taking fluoxetine and temazepam as needed basis.  During my examination her speech was almost back to normal.  Husband is at bedside.  Denies any chest pain or shortness of breath.   PAST MEDICAL HISTORY:  Hypertension and anxiety.   PAST SURGICAL HISTORY:  C-section, oophorectomy.   PSYCHOSOCIAL HISTORY:  Lives at home with husband.  Denies any history of smoking, alcohol or illicit drug usage.   FAMILY HISTORY:  Mother has history of diabetes mellitus and congestive heart failure.  Father had history of heart attack at age 71.   HOME MEDICATIONS:  Restoril 30 mg once a day, Prozac 40 mg once a day,  progesterone 100 mg once daily, Maxzide 50/75 one capsule once daily, bupropion 150 mg 1 tablet by mouth q. 24 hours.   REVIEW OF SYSTEMS: CONSTITUTIONAL:  Denies any fever or fatigue.   EYES:  Denies any blurry vision or double vision.  EARS, NOSE, THROAT:  No epistaxis, discharge.  RESPIRATION:  Denies cough, COPD.  CARDIOVASCULAR:  Denies any chest pain or palpitations.  GASTROINTESTINAL:  Denies nausea, vomiting, diarrhea.  GENITOURINARY:  No dysuria, hematuria.  GYNECOLOGIC AND BREAST:  Denies breast mass or vaginal discharge.  ENDOCRINE:  Denies polyuria, nocturia, thyroid problems.  HEMATOLOGIC AND LYMPHATIC:  No anemia, easy bruising, bleeding.  INTEGUMENTARY:  No acne, rash, lesions.  MUSCULOSKELETAL:  No joint pain in the neck.  Denies any gout.  NEUROLOGIC:  No vertigo, ataxia.  Complaining of dysarthria.  PSYCHIATRIC:  Has chronic history of anxiety and depression.   PHYSICAL EXAMINATION: VITAL SIGNS:  Temperature 98.2, pulse 84, respirations 18, blood pressure 137/56, pulse ox is 96%.  GENERAL APPEARANCE:  Not under acute distress.  Moderately built and nourished.  HEENT:  Normocephalic, atraumatic.  Pupils are equally reacting to light and accommodation.  NECK:  Supple.  No JVD.  No thyromegaly.  No lymphadenopathy.  LUNGS:  Clear to auscultation bilaterally.  No accessory muscle usage.  No anterior chest wall tenderness on palpation.  CARDIAC:  S1, S2 normal.  Regular rate and rhythm.  No murmurs.  GASTROINTESTINAL:  Soft.  Bowel  sounds are positive in all four quadrants.  Nontender, nondistended.  No hepatosplenomegaly.  NEUROLOGIC:  Awake, alert, oriented x 3.  Motor and sensory are intact.  Reflexes are 2+.  No cerebellar signs.  No deviation of the angle of the mouth.   EXTREMITIES:  No edema.  No cyanosis.  No clubbing.  SKIN:  Warm to touch.  Normal turgor.  No rashes.  No lesions.  MUSCULOSKELETAL:  No joint effusion, tenderness, erythema.  PSYCHIATRIC:  Seems to  be anxious, normal affect.    LABORATORY AND IMAGING STUDIES:  LFTs are normal.  Glucose 79, BUN 16, creatinine 1.04, sodium 136, potassium 3.1, chloride 100, CO2 31.  GFR greater than 60.  Anion gap 5.  Serum osmolality 272, calcium 9.4.  CBC normal.  CT head negative.   ASSESSMENT AND PLAN:  A 59 year old female presenting to the ER with a chief complaint of headache and intermittent episodes of slurry speech will be admitted with the following assessment and plan.  1.  Headache with intermittent episodes of dysarthria, rule out transient ischemic attack versus cerebrovascular accident.  Initial CAT scan of the head is negative.  We will admit her to telemetry.  We will do complete stroke work-up with MRI of the brain, carotid Dopplers and 2-D echocardiogram.  Neuro checks.  The patient will be given aspirin and statin.  2.  Hypokalemia.  We will replace potassium and check BMP again.  3.  Hypertension.  Re-consult home medications and resume home medications and up-titrate as needed.  4.  Anxiety.  Resume home medications.  5.  We will provide GI and DVT prophylaxis.    Diagnosis and plan of care was discussed in detail with the patient and her husband at bedside.  They both verbalized understanding of the plan.   CODE STATUS:  SHE IS FULL CODE.  Husband is the medical power of attorney.   Total time spent on admission is 45 minutes.    ____________________________ Nicholes Mango, MD ag:ea D: 01/08/2013 23:52:34 ET T: 01/09/2013 00:33:01 ET JOB#: [id number]  cc: Nicholes Mango, MD, <Dictator> Nicholes Mango MD ELECTRONICALLY SIGNED 01/09/2013 7:06

## 2014-09-23 DIAGNOSIS — K644 Residual hemorrhoidal skin tags: Secondary | ICD-10-CM | POA: Insufficient documentation

## 2014-10-23 ENCOUNTER — Other Ambulatory Visit: Payer: Self-pay | Admitting: Internal Medicine

## 2014-10-29 ENCOUNTER — Other Ambulatory Visit: Payer: Self-pay | Admitting: Internal Medicine

## 2014-11-05 HISTORY — PX: HEMORRHOID BANDING: SHX5850

## 2015-01-05 ENCOUNTER — Emergency Department: Payer: BLUE CROSS/BLUE SHIELD

## 2015-01-05 ENCOUNTER — Telehealth: Payer: Self-pay

## 2015-01-05 ENCOUNTER — Emergency Department
Admission: EM | Admit: 2015-01-05 | Discharge: 2015-01-05 | Disposition: A | Discharge status: Home / Self Care | Payer: BLUE CROSS/BLUE SHIELD | Attending: Emergency Medicine | Admitting: Emergency Medicine

## 2015-01-05 ENCOUNTER — Encounter: Payer: Self-pay | Admitting: Emergency Medicine

## 2015-01-05 DIAGNOSIS — Y998 Other external cause status: Secondary | ICD-10-CM | POA: Diagnosis not present

## 2015-01-05 DIAGNOSIS — Y92328 Other athletic field as the place of occurrence of the external cause: Secondary | ICD-10-CM | POA: Diagnosis not present

## 2015-01-05 DIAGNOSIS — K529 Noninfective gastroenteritis and colitis, unspecified: Secondary | ICD-10-CM

## 2015-01-05 DIAGNOSIS — S0993XA Unspecified injury of face, initial encounter: Secondary | ICD-10-CM | POA: Diagnosis present

## 2015-01-05 DIAGNOSIS — Z880 Allergy status to penicillin: Secondary | ICD-10-CM | POA: Insufficient documentation

## 2015-01-05 DIAGNOSIS — Y9363 Activity, rugby: Secondary | ICD-10-CM | POA: Diagnosis not present

## 2015-01-05 DIAGNOSIS — W500XXA Accidental hit or strike by another person, initial encounter: Secondary | ICD-10-CM | POA: Diagnosis not present

## 2015-01-05 DIAGNOSIS — S022XXA Fracture of nasal bones, initial encounter for closed fracture: Secondary | ICD-10-CM | POA: Diagnosis not present

## 2015-01-05 DIAGNOSIS — K644 Residual hemorrhoidal skin tags: Secondary | ICD-10-CM

## 2015-01-05 HISTORY — DX: Noninfective gastroenteritis and colitis, unspecified: K52.9

## 2015-01-05 LAB — CBC
HCT: 42 % (ref 35.0–47.0)
Hemoglobin: 13.7 g/dL (ref 12.0–16.0)
MCH: 30.7 pg (ref 26.0–34.0)
MCHC: 32.6 g/dL (ref 32.0–36.0)
MCV: 94.1 fL (ref 80.0–100.0)
Platelets: 207 10*3/uL (ref 150–440)
RBC: 4.46 MIL/uL (ref 3.80–5.20)
RDW: 12.8 % (ref 11.5–14.5)
WBC: 11.6 10*3/uL — ABNORMAL HIGH (ref 3.6–11.0)

## 2015-01-05 LAB — URINALYSIS COMPLETE WITH MICROSCOPIC (ARMC ONLY)
Bacteria, UA: NONE SEEN
Bilirubin Urine: NEGATIVE
Glucose, UA: NEGATIVE mg/dL
Hgb urine dipstick: NEGATIVE
Ketones, ur: NEGATIVE mg/dL
Leukocytes, UA: NEGATIVE
Nitrite: NEGATIVE
Protein, ur: NEGATIVE mg/dL
Specific Gravity, Urine: 1.024 (ref 1.005–1.030)
pH: 6 (ref 5.0–8.0)

## 2015-01-05 LAB — COMPREHENSIVE METABOLIC PANEL
ALT: 20 U/L (ref 14–54)
AST: 24 U/L (ref 15–41)
Albumin: 3.6 g/dL (ref 3.5–5.0)
Alkaline Phosphatase: 76 U/L (ref 38–126)
Anion gap: 11 (ref 5–15)
BUN: 17 mg/dL (ref 6–20)
CO2: 26 mmol/L (ref 22–32)
Calcium: 9.2 mg/dL (ref 8.9–10.3)
Chloride: 101 mmol/L (ref 101–111)
Creatinine, Ser: 0.88 mg/dL (ref 0.44–1.00)
GFR calc Af Amer: >60 mL/min (ref >60)
GFR calc non Af Amer: >60 mL/min (ref >60)
Glucose, Bld: 95 mg/dL (ref 65–99)
Potassium: 3.3 mmol/L — ABNORMAL LOW (ref 3.5–5.1)
Sodium: 138 mmol/L (ref 135–145)
Total Bilirubin: 0.7 mg/dL (ref 0.3–1.2)
Total Protein: 7.1 g/dL (ref 6.5–8.1)

## 2015-01-05 LAB — LIPASE, BLOOD: Lipase: 20 U/L — ABNORMAL LOW (ref 22–51)

## 2015-01-05 MED ORDER — METRONIDAZOLE 500 MG PO TABS: 500.0000 mg | ORAL_TABLET | Freq: Three times a day (TID) | ORAL | Status: DC

## 2015-01-05 MED ORDER — ONDANSETRON 8 MG PO TBDP: 8.0000 mg | ORAL_TABLET | Freq: Three times a day (TID) | ORAL | Status: DC | PRN

## 2015-01-05 MED ORDER — IOHEXOL 300 MG/ML  SOLN
100.0000 mL | Freq: Once | INTRAMUSCULAR | Status: DC | PRN
  Filled 2015-01-05: qty 100

## 2015-01-05 MED ORDER — CIPROFLOXACIN HCL 500 MG PO TABS: 500.0000 mg | ORAL_TABLET | Freq: Two times a day (BID) | ORAL | Status: DC

## 2015-01-05 NOTE — ED Notes (Addendum)
Pt here for abdominal pain. Has had cramping since November. States she was dx with "impacted colon"  Here today for continued pain along with bright red blood coming from her rectum.  Reports hx of Hemorids. Had banding done in June.

## 2015-01-05 NOTE — Telephone Encounter (Signed)
Spoke with patient at time of incoming call. Patient is at the ER and would like to know which ovary she had removed in 2001. Pulled patient's paper chart. Per our record patient had Laparoscopic RSO in 12/1999. Patient is agreeable and verbalizes understanding. Will return call if she needs anything further.  Routing to provider for final review. Patient agreeable to disposition. Will close encounter.

## 2015-01-05 NOTE — ED Provider Notes (Signed)
[facility] Emergency Department Provider Note  ____________________________________________  Time seen: 2:20 PM  I have reviewed the triage vital signs and the nursing notes.   HISTORY  Chief Complaint Abdominal Pain and Rectal Bleeding    HPI Carla Fuentes is a 59 y.o. female who woke up this morning around 5:00 AM with severe cramping abdominal pain. She noted that she has had the cramping lower abdominal pain all day. It comes and goes waxing and waning. She did notice a small amount of rectal bleeding when she had a bowel movement and that she's had small caliber bowel movements today. She does have a history of chronic constipation for which she takes multiple laxatives regularly. She also had hemorrhoid banding done in June by surgery Dr. Tamala Julian. She has an appointment with gastroenterology in 3 weeks for colonoscopy. She has been eating and jerking normal with a normal appetite. No vomiting. No fevers chills chest pain shortness of breath or dizziness.     Past Medical History  Diagnosis Date  . Hypertension     started on medication for LEE  . Anxiety   . Depression   . GERD (gastroesophageal reflux disease)   . Abnormal Pap smear   . Basal cell adenocarcinoma     skin, face  . TIA (transient ischemic attack)     ? if TIA or anxiety-was hospitalized x 3 days, normal MRI and CT     Patient Active Problem List   Diagnosis Date Noted  . Concentration deficit 12/18/2013  . Edema 10/15/2013  . Intention tremor 01/26/2013  . Insomnia 09/24/2012  . Other and unspecified hyperlipidemia 08/28/2012  . Obesity (BMI 30-39.9) 08/28/2012  . ANXIETY 05/19/2010  . DEPRESSION 05/19/2010  . HYPERTENSION 05/19/2010  . GERD 05/19/2010     Past Surgical History  Procedure Laterality Date  . Oophorectomy  2001    for recurrent ovarian cyst  . Cesarean section      2, FTP with fetal distress  . Cervical biopsy  w/ loop electrode excision        Current Outpatient Rx  Name  Route  Sig  Dispense  Refill  . ALPRAZolam (XANAX) 0.25 MG tablet   Oral   Take 1 tablet (0.25 mg total) by mouth 3 (three) times daily as needed for sleep or anxiety (Take 1 tablet by mouth every 8 hours as needed for anxiety).   30 tablet   1   . ciprofloxacin (CIPRO) 500 MG tablet   Oral   Take 1 tablet (500 mg total) by mouth 2 (two) times daily.   14 tablet   0   . estradiol (ESTRACE) 1 MG tablet   Oral   Take 1 tablet (1 mg total) by mouth daily.   90 tablet   4   . metroNIDAZOLE (FLAGYL) 500 MG tablet   Oral   Take 1 tablet (500 mg total) by mouth 3 (three) times daily.   30 tablet   0   . ondansetron (ZOFRAN ODT) 8 MG disintegrating tablet   Oral   Take 1 tablet (8 mg total) by mouth every 8 (eight) hours as needed for nausea or vomiting.   20 tablet   0   . progesterone (PROMETRIUM) 100 MG capsule   Oral   Take 1 capsule (100 mg total) by mouth at bedtime.   90 capsule   4   . sertraline (ZOLOFT) 50 MG tablet   Oral   Take 1 tablet (50  mg total) by mouth daily.   90 tablet   3   . temazepam (RESTORIL) 30 MG capsule      TAKE 1 CAPSULE AT BEDTIME AS NEEDED INSOMNIA   30 capsule   5   . triamterene-hydrochlorothiazide (MAXZIDE) 75-50 MG per tablet   Oral   Take 1 tablet by mouth daily.   90 tablet   3      Allergies Penicillins   Family History  Problem Relation Age of Onset  . Diabetes Mother   . Hypertension Mother   . Heart disease Father   . Diabetes Brother     Social History Social History  Substance Use Topics  . Smoking status: Never Smoker   . Smokeless tobacco: Never Used  . Alcohol Use: 0.5 oz/week    1 drink(s) per week     Comment: occasional    Review of Systems  Constitutional:   No fever or chills. No weight changes Eyes:   No blurry vision or double vision.  ENT:   No sore throat. Cardiovascular:   No chest pain. Respiratory:   No dyspnea or cough. Gastrointestinal:    Lower abdominal pain as above. No vomiting or diarrhea..  Positive rectal bleeding. No melena. No hematemesis. Genitourinary:   Negative for dysuria, urinary retention, bloody urine, or difficulty urinating. Musculoskeletal:   Negative for back pain. No joint swelling or pain. Skin:   Negative for rash. Neurological:   Negative for headaches, focal weakness or numbness. Psychiatric:  No anxiety or depression.   Endocrine:  No hot/cold intolerance, changes in energy, or sleep difficulty.  10-point ROS otherwise negative.  ____________________________________________   PHYSICAL EXAM:  VITAL SIGNS: ED Triage Vitals  Enc Vitals Group     BP 01/05/15 1302 148/89 mmHg     Pulse Rate 01/05/15 1302 88     Resp 01/05/15 1302 18     Temp 01/05/15 1302 98.1 F (36.7 C)     Temp Source 01/05/15 1302 Oral     SpO2 01/05/15 1302 96 %     Weight --      Height --      Head Cir --      Peak Flow --      Pain Score --      Pain Loc --      Pain Edu? --      Excl. in [place]? --      Constitutional:   Alert and oriented. Well appearing and in no distress. Anxious Eyes:   No scleral icterus. No conjunctival pallor. PERRL. EOMI ENT   Head:   Normocephalic and atraumatic.   Nose:   No congestion/rhinnorhea. No septal hematoma   Mouth/Throat:   MMM, no pharyngeal erythema. No peritonsillar mass. No uvula shift.   Neck:   No stridor. No SubQ emphysema. No meningismus. Hematological/Lymphatic/Immunilogical:   No cervical lymphadenopathy. Cardiovascular:   RRR. Normal and symmetric distal pulses are present in all extremities. No murmurs, rubs, or gallops. Respiratory:   Normal respiratory effort without tachypnea nor retractions. Breath sounds are clear and equal bilaterally. No wheezes/rales/rhonchi. Gastrointestinal:   Soft with left lower quadrant abdominal pain. No distention. There is no CVA tenderness.  No rebound, rigidity, or guarding. Rectal exam reveals one small noninflamed  external hemorrhoid. Digital exam reveals a scant amount of fresh red blood in the rectum. Genitourinary:   deferred Musculoskeletal:   Nontender with normal range of motion in all extremities. No joint effusions.  No lower extremity  tenderness.  No edema. Neurologic:   Normal speech and language.  CN 2-10 normal. Motor grossly intact. No pronator drift.  Normal gait. No gross focal neurologic deficits are appreciated.  Skin:    Skin is warm, dry and intact. No rash noted.  No petechiae, purpura, or bullae. Psychiatric:   Mood and affect are normal. Speech and behavior are normal. Patient exhibits appropriate insight and judgment.  ____________________________________________    LABS (pertinent positives/negatives) (all labs ordered are listed, but only abnormal results are displayed) Labs Reviewed  LIPASE, BLOOD - Abnormal; Notable for the following:    Lipase 20 (*)    All other components within normal limits  COMPREHENSIVE METABOLIC PANEL - Abnormal; Notable for the following:    Potassium 3.3 (*)    All other components within normal limits  CBC - Abnormal; Notable for the following:    WBC 11.6 (*)    All other components within normal limits  URINALYSIS COMPLETEWITH MICROSCOPIC ([facility] ONLY) - Abnormal; Notable for the following:    Color, Urine AMBER (*)    APPearance CLEAR (*)    Squamous Epithelial / LPF 0-5 (*)    All other components within normal limits   ____________________________________________   EKG    ____________________________________________    RADIOLOGY  CT reveals colitis of the descending colon. She also has diverticulosis which is not inflamed. No evidence of microperforation.  ____________________________________________   PROCEDURES   ____________________________________________   INITIAL IMPRESSION / ASSESSMENT AND PLAN / ED COURSE  Pertinent labs & imaging results that were available during my care of the patient were reviewed  by me and considered in my medical decision making (see chart for details).  Patient presents with lower quadrant abdominal pain. No evidence of appendicitis or torsion AAA cholecystitis perforation obstruction or sepsis. She is very well appearing in no acute distress. She is a little bit anxious which is her baseline. CT reveals colitis. We'll start her on Cipro and Flagyl. She has a follow-up appointment in 3 weeks with gastroenterology for a colonoscopy, so encouraged her to keep this appointment and follow through with the colonoscopy for further evaluation.     ____________________________________________   FINAL CLINICAL IMPRESSION(S) / ED DIAGNOSES  Final diagnoses:  Colitis  External hemorrhoids      Carrie Mew, MD 01/05/15 [phone]

## 2015-01-05 NOTE — ED Notes (Signed)
Patient transported to CT 

## 2015-01-05 NOTE — Discharge Instructions (Signed)
Colitis Colitis is inflammation of the colon. Colitis can be a short-term or long-standing (chronic) illness. Crohn's disease and ulcerative colitis are 2 types of colitis which are chronic. They usually require lifelong treatment. CAUSES  There are many different causes of colitis, including:  Viruses.  Germs (bacteria).  Medicine reactions. SYMPTOMS   Diarrhea.  Intestinal bleeding.  Pain.  Fever.  Throwing up (vomiting).  Tiredness (fatigue).  Weight loss.  Bowel blockage. DIAGNOSIS  The diagnosis of colitis is based on examination and stool or blood tests. X-rays, CT scan, and colonoscopy may also be needed. TREATMENT  Treatment may include:  Fluids given through the vein (intravenously).  Bowel rest (nothing to eat or drink for a period of time).  Medicine for pain and diarrhea.  Medicines (antibiotics) that kill germs.  Cortisone medicines.  Surgery. HOME CARE INSTRUCTIONS   Get plenty of rest.  Drink enough water and fluids to keep your urine clear or pale yellow.  Eat a well-balanced diet.  Call your caregiver for follow-up as recommended. SEEK IMMEDIATE MEDICAL CARE IF:   You develop chills.  You have an oral temperature above 102 F (38.9 C), not controlled by medicine.  You have extreme weakness, fainting, or dehydration.  You have repeated vomiting.  You develop severe belly (abdominal) pain or are passing bloody or tarry stools. MAKE SURE YOU:   Understand these instructions.  Will watch your condition.  Will get help right away if you are not doing well or get worse. Document Released: 05/31/2004 Document Revised: 07/16/2011 Document Reviewed: 08/26/2009 [facility] Patient Information 2015 [place], [place]. This information is not intended to replace advice given to you by your health care provider. Make sure you discuss any questions you have with your health care provider.  Hemorrhoids Hemorrhoids are swollen veins around the  rectum or anus. There are two types of hemorrhoids:   Internal hemorrhoids. These occur in the veins just inside the rectum. They may poke through to the outside and become irritated and painful.  External hemorrhoids. These occur in the veins outside the anus and can be felt as a painful swelling or hard lump near the anus. CAUSES  Pregnancy.   Obesity.   Constipation or diarrhea.   Straining to have a bowel movement.   Sitting for long periods on the toilet.  Heavy lifting or other activity that caused you to strain.  Anal intercourse. SYMPTOMS   Pain.   Anal itching or irritation.   Rectal bleeding.   Fecal leakage.   Anal swelling.   One or more lumps around the anus.  DIAGNOSIS  Your caregiver may be able to diagnose hemorrhoids by visual examination. Other examinations or tests that may be performed include:   Examination of the rectal area with a gloved hand (digital rectal exam).   Examination of anal canal using a small tube (scope).   A blood test if you have lost a significant amount of blood.  A test to look inside the colon (sigmoidoscopy or colonoscopy). TREATMENT Most hemorrhoids can be treated at home. However, if symptoms do not seem to be getting better or if you have a lot of rectal bleeding, your caregiver may perform a procedure to help make the hemorrhoids get smaller or remove them completely. Possible treatments include:   Placing a rubber band at the base of the hemorrhoid to cut off the circulation (rubber band ligation).   Injecting a chemical to shrink the hemorrhoid (sclerotherapy).   Using a tool to burn  the hemorrhoid (infrared light therapy).   Surgically removing the hemorrhoid (hemorrhoidectomy).   Stapling the hemorrhoid to block blood flow to the tissue (hemorrhoid stapling).  HOME CARE INSTRUCTIONS   Eat foods with fiber, such as whole grains, beans, nuts, fruits, and vegetables. Ask your doctor about  taking products with added fiber in them (fibersupplements).  Increase fluid intake. Drink enough water and fluids to keep your urine clear or pale yellow.   Exercise regularly.   Go to the bathroom when you have the urge to have a bowel movement. Do not wait.   Avoid straining to have bowel movements.   Keep the anal area dry and clean. Use wet toilet paper or moist towelettes after a bowel movement.   Medicated creams and suppositories may be used or applied as directed.   Only take over-the-counter or prescription medicines as directed by your caregiver.   Take warm sitz baths for 15-20 minutes, 3-4 times a day to ease pain and discomfort.   Place ice packs on the hemorrhoids if they are tender and swollen. Using ice packs between sitz baths may be helpful.   Put ice in a plastic bag.   Place a towel between your skin and the bag.   Leave the ice on for 15-20 minutes, 3-4 times a day.   Do not use a donut-shaped pillow or sit on the toilet for long periods. This increases blood pooling and pain.  SEEK MEDICAL CARE IF:  You have increasing pain and swelling that is not controlled by treatment or medicine.  You have uncontrolled bleeding.  You have difficulty or you are unable to have a bowel movement.  You have pain or inflammation outside the area of the hemorrhoids. MAKE SURE YOU:  Understand these instructions.  Will watch your condition.  Will get help right away if you are not doing well or get worse. Document Released: 04/20/2000 Document Revised: 04/09/2012 Document Reviewed: 02/26/2012 [facility] Patient Information 2015 [place], [place]. This information is not intended to replace advice given to you by your health care provider. Make sure you discuss any questions you have with your health care provider.

## 2015-01-06 HISTORY — PX: COLONOSCOPY: SHX174

## 2015-02-18 ENCOUNTER — Ambulatory Visit (INDEPENDENT_AMBULATORY_CARE_PROVIDER_SITE_OTHER): Payer: BLUE CROSS/BLUE SHIELD | Admitting: Obstetrics & Gynecology

## 2015-02-18 ENCOUNTER — Encounter: Payer: Self-pay | Admitting: Obstetrics & Gynecology

## 2015-02-18 VITALS — BP 128/78 | HR 72 | Resp 16 | Ht 64.75 in | Wt 200.0 lb

## 2015-02-18 DIAGNOSIS — N83202 Unspecified ovarian cyst, left side: Secondary | ICD-10-CM

## 2015-02-18 DIAGNOSIS — R102 Pelvic and perineal pain: Secondary | ICD-10-CM

## 2015-02-18 DIAGNOSIS — E2839 Other primary ovarian failure: Secondary | ICD-10-CM

## 2015-02-18 DIAGNOSIS — Z13820 Encounter for screening for osteoporosis: Secondary | ICD-10-CM

## 2015-02-18 DIAGNOSIS — Z01419 Encounter for gynecological examination (general) (routine) without abnormal findings: Secondary | ICD-10-CM

## 2015-02-18 NOTE — Progress Notes (Signed)
59 y.o. [id number] MarriedCaucasianF here for annual exam.  Pt has been experincing issues with throbbing sensation in her pelvic region.  Pt went to the ER in the end of August.  Was diagnosed with colitis.  Was treated with antibiotics.  Has seen Dr. Tiffany Kocher, GI, after ER visit.  Had a colonoscopy around 01/25/15 and pt reports having several biopsies.  She states she was told she still had "mild colitis" but nothing else was needed.   When in the ER in August, she had a CT.  Reviewed with pt.  She has a small 2cm cyst.  Pt wonders if this is part of the problem.  I am doubtful of this.    Denies vaginal bleeding.    PCP:  Dr. Candiss Norse.  Blood work was all done for insurance three or four weeks ago.    Patient's last menstrual period was 12/06/2011.          Sexually active: Yes.    The current method of family planning is post menopausal status.    Exercising: Yes.    Walking Smoker:  no  Health Maintenance: Pap:  01/12/14 Neg with neg HR HPV History of abnormal Pap:  Yes, remote hx of LEEP MMG:  01/27/14 BIRADS1:Neg Colonoscopy:  01/2015 -"mild colitis". Repeat 5 years  BMD:   09/2009 TDaP:  01/2013 Screening Labs: PCP, Hb today: PCP, Urine today: PCP   reports that she has never smoked. She has never used smokeless tobacco. She reports that she drinks about 0.5 oz of alcohol per week. She reports that she does not use illicit drugs.   Past Surgical History  Procedure Laterality Date  . Oophorectomy  2001    for recurrent ovarian cyst  . Cesarean section      2, FTP with fetal distress  . Cervical biopsy  w/ loop electrode excision    . Hemorrhoid banding  11/2014  . Colonoscopy  01/2015    repeat 5 years    Current Outpatient Prescriptions  Medication Sig Dispense Refill  . ALPRAZolam (XANAX) 0.25 MG tablet Take 1 tablet (0.25 mg total) by mouth 3 (three) times daily as needed for sleep or anxiety (Take 1 tablet by mouth every 8 hours as needed for anxiety). 30 tablet 1  . estradiol  (ESTRACE) 1 MG tablet Take 1 tablet (1 mg total) by mouth daily. 90 tablet 4  . FLUoxetine (PROZAC) 20 MG capsule Take 1 capsule by mouth daily.  3  . Multiple Vitamin (MULTIVITAMIN) tablet Take 1 tablet by mouth daily.    Marland Kitchen omeprazole (PRILOSEC) 40 MG capsule TAKE 1 CAPSULE (40 MG TOTAL) BY MOUTH ONCE DAILY.  3  . progesterone (PROMETRIUM) 100 MG capsule Take 1 capsule (100 mg total) by mouth at bedtime. 90 capsule 4  . temazepam (RESTORIL) 30 MG capsule TAKE 1 CAPSULE AT BEDTIME AS NEEDED INSOMNIA 30 capsule 5  . triamterene-hydrochlorothiazide (MAXZIDE) 75-50 MG per tablet Take 1 tablet by mouth daily. 90 tablet 3   No current facility-administered medications for this visit.    Family History  Problem Relation Age of Onset  . Diabetes Mother   . Hypertension Mother   . Heart disease Father   . Diabetes Brother     ROS:  Pertinent items are noted in HPI.  Otherwise, a comprehensive ROS was negative.  Exam:   BP 128/78 mmHg  Pulse 72  Resp 16  Ht 5' 4.75" (1.645 m)  Wt 200 lb (90.719 kg)  BMI 33.52 kg/m2  LMP 12/06/2011  Weight change: +4#    Height: 5' 4.75" (164.5 cm)  Ht Readings from Last 3 Encounters:  02/18/15 5' 4.75" (1.645 m)  01/12/14 5' 4.25" (1.632 m)  12/18/13 5' 4.75" (1.645 m)    General appearance: alert, cooperative and appears stated age Head: Normocephalic, without obvious abnormality, atraumatic Neck: no adenopathy, supple, symmetrical, trachea midline and thyroid normal to inspection and palpation Lungs: clear to auscultation bilaterally Breasts: normal appearance, no masses or tenderness Heart: regular rate and rhythm Abdomen: soft, non-tender; bowel sounds normal; no masses,  no organomegaly Extremities: extremities normal, atraumatic, no cyanosis or edema Skin: Skin color, texture, turgor normal. No rashes or lesions Lymph nodes: Cervical, supraclavicular, and axillary nodes normal. No abnormal inguinal nodes palpated Neurologic: Grossly  normal   Pelvic: External genitalia:  no lesions              Urethra:  normal appearing urethra with no masses, tenderness or lesions              Bartholins and Skenes: normal                 Vagina: normal appearing vagina with normal color and discharge, no lesions              Cervix: no lesions              Pap taken: No. Bimanual Exam:  Uterus:  normal size, contour, position, consistency, mobility, non-tender              Adnexa: normal adnexa and no mass, fullness, tenderness  (cannot reproduce pain)               Rectovaginal: Confirms               Anus:  normal sphincter tone, no lesions  Chaperone was present for exam.  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 Neurology follow up flet anxiety was diagnosis.   ER visit with colitis 8/16.  Follow up colonoscopy 9/16 with "mild colitis"  Hypertension  P: Mammogram yearly.  Pt aware MMG just due.  She states she will schedule. Pap with neg HR HPV 2015.  No pap today. Plan PUS to assess ovarian cyst Get colonoscopy record release to see if pt needs another opinion Ok to continue estradiol 1.0mg  daily and Prometrium 100mg  daily.  #90/4RF.   Will plan BMD this year as well. Follow up one year for AEX, for new issues/problems, and for PUS

## 2015-02-18 NOTE — Progress Notes (Signed)
Patient scheduled for Pelvic ultrasound with Dr. Sabra Heck 02/24/15 at 1230 with 1300 consult.

## 2015-02-21 ENCOUNTER — Telehealth: Payer: Self-pay | Admitting: Obstetrics & Gynecology

## 2015-02-21 NOTE — Telephone Encounter (Signed)
Called patient regarding insurance for upcoming appointment. Need up to date insurance information.

## 2015-02-24 ENCOUNTER — Ambulatory Visit (INDEPENDENT_AMBULATORY_CARE_PROVIDER_SITE_OTHER): Payer: BLUE CROSS/BLUE SHIELD

## 2015-02-24 ENCOUNTER — Ambulatory Visit: Payer: BLUE CROSS/BLUE SHIELD | Admitting: Obstetrics & Gynecology

## 2015-02-24 ENCOUNTER — Encounter: Payer: Self-pay | Admitting: Obstetrics & Gynecology

## 2015-02-24 VITALS — BP 120/86 | HR 64 | Resp 16 | Wt 195.0 lb

## 2015-02-24 DIAGNOSIS — K529 Noninfective gastroenteritis and colitis, unspecified: Secondary | ICD-10-CM | POA: Insufficient documentation

## 2015-02-24 DIAGNOSIS — Z13820 Encounter for screening for osteoporosis: Secondary | ICD-10-CM

## 2015-02-24 DIAGNOSIS — N83202 Unspecified ovarian cyst, left side: Secondary | ICD-10-CM | POA: Diagnosis not present

## 2015-02-24 DIAGNOSIS — E2839 Other primary ovarian failure: Secondary | ICD-10-CM

## 2015-02-24 DIAGNOSIS — R102 Pelvic and perineal pain: Secondary | ICD-10-CM

## 2015-02-24 MED ORDER — PROGESTERONE MICRONIZED 100 MG PO CAPS: 100.0000 mg | ORAL_CAPSULE | Freq: Every day | ORAL | Status: DC

## 2015-02-24 MED ORDER — ESTRADIOL 1 MG PO TABS: 1.0000 mg | ORAL_TABLET | Freq: Every day | ORAL | Status: DC

## 2015-02-24 NOTE — Progress Notes (Signed)
Patient scheduled for Pelvic ultrasound in four months per Dr. Sabra Heck.  Scheduled for 06/29/14 at 1230 with 1300 consult.  Patient advised to call back with any insurance changes.  Dexa bone density order faxed to [facility]. They will contact patient to schedule mammogram and Bone density.

## 2015-02-24 NOTE — Progress Notes (Signed)
59 y.o. Carla Fuentes here for a pelvic ultrasound due to 2cm cyst noted on left ovary with CT this summer.  As well, pt has been experiencing a deep and low throbbing sensation that is midline and behind her bladder.  H/o recent treatment for colitis with follow up colonoscopy showing "mild colitis" per her hx.  Records did come from [facility] but was only the colonoscopy.  No pathology results were sent.  Will need to have pt sign release for these today.  Reviewed Care Everywhere.  Actual results are no present, just notation that colonoscopy was done and pathology sent.    Pt is not having cycles.  Pain is not like menstrual cramping.  Urine done 01/05/15 was negative.  Patient's last menstrual period was 12/06/2011.  Sexually active:  yes  Contraception: PMP status  FINDINGS: UTERUS: 6.8 x 3.7 x 4.4cm EMS: 2.3 - 3.21mm ADNEXA:   Left ovary 3.0 x 2.2 x 2.4 cm with 2.0 x 1.7 x 1.6 smooth, avascular cyst   Right ovary not present CUL DE SAC: no free fluid noted  Reviewed findings with pt.  I'm not sure that this cyst is causing all of her discomfort.  I would really like her to have a second opinion regarding her recent colitis before we proceed with surgery.  Pt is comfortable with this.  Do not have pathology report from colonoscopy biopsies.  Will have pt sign a release for this today as well.    Assessment:  Left 2.0cm simple ovarian cyst, low pelvic throbbing, recent colitis  Plan: Refer to Dr. Collene Mares.  Order placed. Release for pathology with colonoscopy signed. Pt will return for repeat PUS 4 months.  ~20 minutes spent with patient >50% of time was in face to face discussion of above.

## 2015-02-28 ENCOUNTER — Other Ambulatory Visit: Payer: Self-pay | Admitting: Obstetrics & Gynecology

## 2015-03-17 ENCOUNTER — Telehealth: Payer: Self-pay

## 2015-03-17 NOTE — Telephone Encounter (Signed)
lmtcb to discuss BMD results-copy on KN's desk.//kn

## 2015-03-17 NOTE — Telephone Encounter (Signed)
Patient notified of results. Copy will be scanned in epic.//kn

## 2015-03-22 ENCOUNTER — Other Ambulatory Visit: Payer: Self-pay | Admitting: Obstetrics & Gynecology

## 2015-03-29 ENCOUNTER — Telehealth: Payer: Self-pay

## 2015-03-29 NOTE — Telephone Encounter (Signed)
Thank you for the update.  Encounter closed. 

## 2015-03-29 NOTE — Telephone Encounter (Signed)
Call to patient asking if she has seen Dr Collene Mares for 2nd opinion. States she has not, but has talked to their office on the phone twice. They have not received any reports/notes regarding colonoscopy and colitis so she felt that there was no reason to go until they had those. Also, stated her colitis is back. Aware I will check with Dr Sabra Heck for advice. Patient is leaving today for [place] and will be available on her cell phone most of the afternoon. Please advise.//kn

## 2015-03-29 NOTE — Telephone Encounter (Signed)
Referrals has been working on Ms Devol's referral at length by patient request. Dr Collene Mares was not in office for some time and patient requested to wait until she was back and had reviewed notes sent by [facility]. Dr Lorie Apley office contacted referrals on 03/28/15 to notify Dr Collene Mares did not recommend scheduling patient. She felt she would not be able to provide additional treatment based on patients [facility] tests/treatment. Forwarded note to Dr Sabra Heck. She requested to check with patient and offer referral for second opinion at [facility]. I spoke with Ms Macdonnell regarding Dr Collene Mares referral. She states if Dr Man believes she is unable to help "then just forget it". She states she is currently in the midst of a colitis flare up and is unsure if anyone can help treat it. I reviewed Dr Ammie Ferrier recommendations with patient regarding a second opinion at [facility] and she states she's already been through all the tests and is unsure what else can be done. Declines to schedule at this time.  Referral closed if approved by Dr Sabra Heck. Routing to provider for review.  [employer]

## 2015-06-06 ENCOUNTER — Telehealth: Payer: Self-pay | Admitting: Obstetrics & Gynecology

## 2015-06-06 NOTE — Telephone Encounter (Signed)
Called patient to discuss benefits for a procedure. Left Voicemail requesting a call back. °

## 2015-06-24 ENCOUNTER — Telehealth: Payer: Self-pay | Admitting: Obstetrics & Gynecology

## 2015-06-24 NOTE — Telephone Encounter (Signed)
Called patient to verify if she has her new insurance information available. Patient is in the process of moving and is not going to be able to keep appointment on 06/30/15. She has rescheduled for 07/07/15 with Dr Sabra Heck. Patient also provided new insurance information. I will verify these benefits and call patient next week to review benefits for ultrasound under new plan [facility])

## 2015-06-30 ENCOUNTER — Other Ambulatory Visit: Payer: BLUE CROSS/BLUE SHIELD

## 2015-06-30 ENCOUNTER — Other Ambulatory Visit: Payer: BLUE CROSS/BLUE SHIELD | Admitting: Obstetrics & Gynecology

## 2015-07-07 ENCOUNTER — Ambulatory Visit (INDEPENDENT_AMBULATORY_CARE_PROVIDER_SITE_OTHER): Payer: BLUE CROSS/BLUE SHIELD | Admitting: Obstetrics & Gynecology

## 2015-07-07 ENCOUNTER — Ambulatory Visit (INDEPENDENT_AMBULATORY_CARE_PROVIDER_SITE_OTHER): Payer: BLUE CROSS/BLUE SHIELD

## 2015-07-07 VITALS — BP 130/86 | HR 66 | Resp 14 | Ht 64.75 in

## 2015-07-07 DIAGNOSIS — N83202 Unspecified ovarian cyst, left side: Secondary | ICD-10-CM

## 2015-07-07 DIAGNOSIS — R1032 Left lower quadrant pain: Secondary | ICD-10-CM

## 2015-07-07 DIAGNOSIS — K5289 Other specified noninfective gastroenteritis and colitis: Secondary | ICD-10-CM | POA: Diagnosis not present

## 2015-07-07 NOTE — Progress Notes (Signed)
60 y.o. [id number] Married Caucasian female here for pelvic ultrasound due to LLQ pain, h/o colitis and h/o left 2cm ovarian cyst.  Pt has hx of RSO.  Pt reports pain is still present but she feels, more and more, this is GI related.  She and spouse decided to sell "dream home" and home sold so quickly and buyers wanted to move in in three weeks after closing.  As a result, she and spouse recently bought a town house but going for 4000 sq ft + down to about 1800 sq ft has been very hard and very stressful.  Husband, again, lost job.  This is fourth time in about that many years.  Pt feels everytime there is an event like this, she has worsening pain.  As well, husband wants her to go back to work and she doesn't want to so that is stressful as well.  Patient's last menstrual period was 12/06/2011.  Contraception: PMP status  Findings:  UTERUS: 7.8 x 3.6 x 4.0cm EMS: could not be defined on today's scan but no clear masses identified. (And pt denies VB for several years) ADNEXA: Left ovary: 2.4 x 2.2 x 1.5cm with 2.2 x 2.1cm simple cyst, 1.1 x 1.1cm simple cyst, and 0.9 x 0.9cm simple cyst.  No significant change noted in larger cyst.  Overlying bowl gas cause poorer image of ovary last time and two smaller cysts could not be seen.  All are avascular       Right ovary: surgically absent CUL DE SAC: no free fluid  Discussion:  Findings reviewed with pt.  It is clear, today, looking back at prior PUS that part of left ovary was obscured on last PUS.  Feel this ovary should continue to be watched but I would not recommend surgery as I do not think this will help her at all.  She is comfortable with plan.  Will plan repeat PUS in 6 months.  Assessment:  2.0cm left ovarian cyst. H/O LLQ pain H/O colitis  Plan:  Repeat PUS six months.  This will be scheduled before pt leaves today.  ~15 minutes spent with patient >50% of time was in face to face discussion of above.

## 2015-07-10 ENCOUNTER — Encounter: Payer: Self-pay | Admitting: Obstetrics & Gynecology

## 2015-07-15 ENCOUNTER — Telehealth: Payer: Self-pay | Admitting: Obstetrics & Gynecology

## 2015-07-15 NOTE — Telephone Encounter (Signed)
Patient was seen in the office on 07/07/2015 for PUS with Dr.Miller for evaluation of LLQ pain. States at that appointment she meant to ask about starting on Contrave to help with weight loss. Reports she has gained 45 pounds in 4 years after going through menopause. "I just really need some help getting this weight off." Advised I will speak with Dr.Miller and return call with further recommendations. She is agreeable. BP at OV on 07/07/2015 was 130/86.

## 2015-07-15 NOTE — Telephone Encounter (Signed)
Patient was seen with Dr. Sabra Heck recently and she said she forgot to ask her about a prescription for Contrave. Best # to reach: [phone] Preferred Pharmacy: [facility] [address]

## 2015-08-01 NOTE — Telephone Encounter (Signed)
Routing to Dr Miller for review.  

## 2015-08-01 NOTE — Telephone Encounter (Signed)
Patient states she is still waiting to hear back from the nurse about getting some diet pills.

## 2015-08-03 NOTE — Telephone Encounter (Signed)
Spoke with patient. Advised of message as seen below from [place]. Patient states that she would like to take something that Dr.Miller recommends. "Would she take this? Or is there a better option for me that she usually prescribes?" Advised Phentermine is the appetite suppressant we prescribe most commonly. "I would be okay with that if she thinks that it a better option." Advised I will speak with Dr.Miller and return call with additional recommendations. She declines to schedule OV to discuss medications at this time. Is aware she may still need OV prior to starting either medication. She is agreeable.

## 2015-08-03 NOTE — Telephone Encounter (Signed)
I've had to so some education regarding this medication as it is new.  I've never prescribed it for someone.  She will need a consult to review risks, benefits, when to stop, side effects.  I am willing to prescribe if she wants this but feel we should discuss it thoroughly first.

## 2015-08-09 NOTE — Telephone Encounter (Signed)
Routing to Dr.Miller for review and advise. 

## 2015-08-09 NOTE — Telephone Encounter (Signed)
Patient calling to check on status of the request.

## 2015-08-11 NOTE — Telephone Encounter (Signed)
Routing to Lamont Snowball, RN  Clinical Nursing supervisor to review.

## 2015-08-12 NOTE — Telephone Encounter (Signed)
Call to patient. Advised office visit need to discuss options and pros/cons with Dr Sabra Heck. Dr Sabra Heck may want to do labs before beginning medications. This is not something to be evaluated over the phone. Office visit scheduled for 08-16-15 with Dr Sabra Heck.  Routing to provider for final review. Patient agreeable to disposition. Will close encounter.

## 2015-08-16 ENCOUNTER — Telehealth: Payer: Self-pay | Admitting: Obstetrics & Gynecology

## 2015-08-16 ENCOUNTER — Ambulatory Visit: Payer: BLUE CROSS/BLUE SHIELD | Admitting: Obstetrics & Gynecology

## 2015-08-16 NOTE — Telephone Encounter (Signed)
Patient canceled her consult appointment today. Patient is sick and will call later to reschedule.

## 2015-09-05 ENCOUNTER — Other Ambulatory Visit: Payer: Self-pay | Admitting: Obstetrics & Gynecology

## 2015-09-05 DIAGNOSIS — R609 Edema, unspecified: Secondary | ICD-10-CM

## 2015-09-05 NOTE — Telephone Encounter (Signed)
Patietn requesting refill of Triamterene[facility] [place]  Medication refill request: Triamterene-hydrochlorothiazide Last AEX:  02/18/15 with SM  Next AEX: 06/12/2016 with SM  Last MMG (if hormonal medication request): n/a Refill authorized: Please advise

## 2015-09-06 ENCOUNTER — Telehealth: Payer: Self-pay | Admitting: *Deleted

## 2015-09-06 NOTE — Telephone Encounter (Signed)
Patient notified. Verbalized understanding. She will call her PCP and request a refill from them.  Encounter closed.

## 2015-09-06 NOTE — Telephone Encounter (Signed)
Medication RF declined as pt typically gets this from PCP.

## 2015-09-06 NOTE — Telephone Encounter (Signed)
-----   Message from Megan Salon, MD sent at 09/05/2015  1:58 PM EDT ----- Regarding: Rayetta Pigg, Can you let this pt know that her PCP needs to refill the Maxide.  I do not typically do this and the last refill was done by a Dr. Ronette Deter.  Thanks.  CC:  Jasmine as she sent the RF request to me.  Thanks.  MSM

## 2015-09-07 NOTE — Telephone Encounter (Signed)
[name], CMA at 09/06/2015 9:03 AM     Status: Signed       Expand All Collapse All   Patient notified. Verbalized understanding. She will call her PCP and request a refill from them.  Encounter closed.             [name], CMA at 09/06/2015 9:03 AM     Status: Signed       Expand All Collapse All   ----- Message from Megan Salon, MD sent at 09/05/2015 1:58 PM EDT ----- Regarding: Carla Fuentes, Can you let this pt know that her PCP needs to refill the Maxide. I do not typically do this and the last refill was done by a Dr. Ronette Deter. Thanks.  CC: Ogden Handlin as she sent the RF request to me. Thanks.  MSM

## 2015-09-23 ENCOUNTER — Telehealth: Payer: Self-pay | Admitting: Obstetrics & Gynecology

## 2015-09-23 ENCOUNTER — Ambulatory Visit (INDEPENDENT_AMBULATORY_CARE_PROVIDER_SITE_OTHER): Payer: BLUE CROSS/BLUE SHIELD | Admitting: Obstetrics & Gynecology

## 2015-09-23 ENCOUNTER — Telehealth: Payer: Self-pay

## 2015-09-23 VITALS — BP 130/88 | HR 108 | Resp 20

## 2015-09-23 DIAGNOSIS — R1904 Left lower quadrant abdominal swelling, mass and lump: Secondary | ICD-10-CM | POA: Diagnosis not present

## 2015-09-23 DIAGNOSIS — F4322 Adjustment disorder with anxiety: Secondary | ICD-10-CM

## 2015-09-23 NOTE — Progress Notes (Addendum)
GYNECOLOGY  VISIT   HPI: 60 y.o. G78P2012 Married Caucasian female with complaint of axillary mass and inguinal mass that she just noticed.  She called this morning, frantic, with this finding.  She described a vulvar mass via the phone call that she stated was 3cm and enlarged and tender but she doesn't think this is an infection.  Denies fever.  Denies bowel or bladder change.  She's had GI issues since her colitis diagnosis.  She got on the internet and read that this could be lymphoma or lupus.  This is what made her so frantic on the phone.  She is emotional today when stating this.  Mass is not vulvar but inguinal.  This is about 3cm to her measurement and is tender to palpation.  It is low in the left side of the abdomen.  Pt has recently moved homes and has done a lot of lifting of boxes.  The area is achy and tender to the touch. She denies fever.  She's had no recent changes with GI function.  She does have diarrhea at times but this hasn't changed recently.  Denies urinary function changes as well.  Denies pelvic pain, pelvic fullness.  H/O unilateral oophorectomy (RSO) in 2001 due to large ovarian cyst.    She recently had blood work done with PCP which I can review through [employer].  This was done 09/14/15.  She had a CBC with diff that was completely normal.  All lab work reviewed with pt--chem profile, lipids, TSH, CBC with diff.    GYNECOLOGIC HISTORY: Patient's last menstrual period was 12/06/2011.  Patient Active Problem List   Diagnosis Date Noted  . Noninfectious gastroenteritis and colitis 02/24/2015  . External hemorrhoid 09/23/2014  . Concentration deficit 12/18/2013  . Attention and concentration deficit 12/18/2013  . Edema 10/15/2013  . Intention tremor 01/26/2013  . 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 Insomnia 09/24/2012  . Other and unspecified hyperlipidemia 08/28/2012  . Obesity (BMI 30-39.9) 08/28/2012  . Adiposity 08/28/2012  . ANXIETY 05/19/2010  . DEPRESSION  05/19/2010  . GERD 05/19/2010  . Essential (primary) hypertension 05/19/2010  . 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 Hypertension     started on medication for LEE  . Anxiety   . Depression   . GERD (gastroesophageal reflux disease)   . Abnormal Pap smear   . Basal cell adenocarcinoma     skin, face  . TIA (transient ischemic attack)     initially question as TIA, evaluation negative.  Neurologist felt this was anxiety related.  . Colitis 01/05/15    treated wtih antibiotics    Past Surgical History  Procedure Laterality Date  . Oophorectomy  2001    for recurrent ovarian cyst  . Cesarean section      2, FTP with fetal distress  . Cervical biopsy  w/ loop electrode excision    . Hemorrhoid banding  11/2014    Dr. Tamala Julian, [place]  . Colonoscopy  01/2015    repeat 5 years    MEDS:  Reviewed in EPIC and UTD  ALLERGIES: Penicillins  Family History  Problem Relation Age of Onset  . Diabetes Mother   . Hypertension Mother   . Heart disease Father   . Diabetes Brother     SH:  Married, non smoker  Review of Systems  Constitutional: Negative for fever, chills, weight loss and malaise/fatigue.  Respiratory: Negative.   Cardiovascular: Negative.  Gastrointestinal: Positive for abdominal pain.  Genitourinary: Negative.   Neurological: Negative for weakness and headaches.  Endo/Heme/Allergies: Negative.   All other systems reviewed and are negative.   PHYSICAL EXAMINATION:   Filed Vitals:   09/23/15 1124  BP: 130/88  Pulse: 108  Resp: 20   Physical Exam  Constitutional: She is oriented to person, place, and time. She appears well-developed and well-nourished.  Cardiovascular: Normal rate and regular rhythm.   Pulmonary/Chest: Effort normal and breath sounds normal. Right breast exhibits no inverted nipple, no mass, no nipple discharge, no skin change and no tenderness. Left breast exhibits  no inverted nipple, no mass, no nipple discharge, no skin change and no tenderness.    Abdominal: Soft. Bowel sounds are normal. She exhibits mass (fatty fullness in inguinal/RLQ region.  This is what pt is feeling.  No distinct lymph node noted here.). She exhibits no distension. There is tenderness (right inguinal tenderness). There is guarding. There is no rebound.  Lymphadenopathy:    She has no axillary adenopathy.       Right: No inguinal adenopathy present.       Left: No inguinal adenopathy present.  Neurological: She is alert and oriented to person, place, and time.  Skin: Skin is warm and dry.  Psychiatric:  Anxious today   Chaperone was present for exam.  Assessment: Axillary fat pad, no mass or LAD noted.  Normal MMG 11/16.  This is not a mass and does not need diagnostic imaging Inguinal mass/fullness/fatty tissue, r/o inguinal hernia  Plan: Will need to proceed with CT abd/pelvic to R/O inguinal hernia.  Will see if can be scheduled today.  Pt quite anxious even after reviewing all of her prior lab work and discussing diagnosis of some of the medical problems she read about on the internet.  Highly advised pt not to get medical information off of the internet before coming into office for visit as she has the possibility of really working herself up and being quite anxious.   ~25 minutes spent with patient >50% of time was in face to face discussion of above, in counseling about why her concerns are not consistent with physical exam findings.

## 2015-09-23 NOTE — Telephone Encounter (Signed)
Left message to call Basha Krygier at [phone]. 

## 2015-09-23 NOTE — Telephone Encounter (Signed)
Call to patient. Patient is at [facility] to obtain Ct abdomen/pelvic for LLQ mass. PA for Ct abdomen/pelvis had to go to peer to peer review. Advised patient that Dr.Miller is seeing patient's this afternoon and cannot perform this until later this afternoon. Advised we will need to reschedule her appointment to Monday 09/26/2015 to ensure enough time for peer to peer review to be performed. Patient is very concerned. Apologized and advised imaging cannot be performed without this approval. Patient is agreeable to reschedule this appointment.  Call to [place]. Per [facility] patient is in their office now and they will work with her to get this appointment rescheduled. Advised we will notify the patient and [facility] with peer to peer review outcome.  Imaging is agreeable.  Cc: Lerry Liner

## 2015-09-23 NOTE — Telephone Encounter (Signed)
Patient returned call

## 2015-09-23 NOTE — Telephone Encounter (Signed)
Patient called and said, "I have a swollen lymph node in my left groin area. It hurts and I don't know if I need to see my primary care doctor or Dr. Sabra Heck."

## 2015-09-23 NOTE — Progress Notes (Signed)
Call to [place] while the patient was in the office. Per Mia patient may go now to [facility] location to have Ct Abdomen/Pelvis with contrast performed. Patient is agreeable and will head to their location now. Contrast will be provided when she arrives. Precert to be performed by our office.

## 2015-09-23 NOTE — Telephone Encounter (Signed)
Spoke with patient. Advised I have spoken with Dr.Miller who recommend she be seen with our office. PUS is likely not needed. Patient is agreeable. Appointment scheduled for today at 11:15 am with Dr.Miller. She is agreeable to date and time.  Routing to provider for final review. Patient agreeable to disposition. Will close encounter.

## 2015-09-23 NOTE — Telephone Encounter (Signed)
Spoke with patient. Patient states that she has a "swollen lymph node" in her left groin area between the thigh and vagina. Reports the area is very tender to the touch. Is around 3 inches wide and is an oval shape. Is unable to see the area to describe any color change. Denies any warmth to the area. Denies any fever or chills. Advised she will need to be seen in the office today for further evaluation. Patient begins to cry stating "Can you please talk to [place] first to see if I need to see her or my primary care doctor? If she thinks I will need an ultrasound it would be easier to see my primary care in [place]." Advised without seeing the area [place] may not be able to fully advise if an ultrasound is needed. Patient again asks for me to ask Dr.Miller's opinion. Advised I will speak with Dr.Miller and return call. She is agreeable.

## 2015-09-25 ENCOUNTER — Encounter: Payer: Self-pay | Admitting: Obstetrics & Gynecology

## 2015-09-26 ENCOUNTER — Ambulatory Visit
Admission: RE | Admit: 2015-09-26 | Discharge: 2015-09-26 | Disposition: A | Discharge status: Home / Self Care | Payer: BLUE CROSS/BLUE SHIELD | Source: Ambulatory Visit | Attending: Obstetrics & Gynecology | Admitting: Obstetrics & Gynecology

## 2015-09-26 DIAGNOSIS — R1904 Left lower quadrant abdominal swelling, mass and lump: Secondary | ICD-10-CM

## 2015-09-26 MED ORDER — IOPAMIDOL (ISOVUE-300) INJECTION 61%
100.0000 mL | Freq: Once | INTRAVENOUS | Status: AC | PRN
  Administered 2015-09-26: 100 mL via INTRAVENOUS

## 2015-09-26 NOTE — Telephone Encounter (Signed)
This is a new complaint.  I was aware of her prior CT scan.  She did not have the same complaint at that time.  She can always decline having a test done but that is not my recommendation at this time.

## 2015-09-26 NOTE — Telephone Encounter (Signed)
Spoke with patient as CT scan was not rescheduled. Patient is asking if this is something Dr.Miller "thinks is necessary." States she had a CT scan performed in 2016 and would like Dr.Miller to look at this to advise if she needs another. CT Abdomen/Pelvis is available in EPIC dated 01/05/2015. Advised as this is a new problem CT from 2016 is likely not a good representation for what is occuring at this time. Patient would like Dr.Miller to review this and advise before moving forward. States she can only be seen today for the CT scan as she is going out of town tomorrow for a week. Advised in order to schedule this today we will need to schedule soon.  Routing to [place] for review.

## 2015-09-26 NOTE — Telephone Encounter (Signed)
Spoke with patient. Advised of message as seen below from [place]. She is agreeable.   Call to [place]. Spoke with Mia. Ct Abdomen/Pelvis scheduled for today at 12 pm at [facility]. Patient will need to be at [place] to pick up oral contrast by 10 am.   Spoke with patient. Patient states she is unable to pick up oral contrast by 10 am. States she does not have someone who can pick up the contrast. Advised I have scheduled her for their open CT slot today and cannot guarantee further availability. Patient requests I contact [facility] to reschedule appointment for today. Advised I will speak with [facility] and return call. She is agreeable.  Spoke with Prescilla Sours at [facility] who states if the patient can be at their location by 10:30 am they will be able to scan her today.   Return call to patient. Advised she will need to be at [facility] today by 10:30 am to pick up contrast. She is agreeable and will leave now.  Routing to provider for final review. Patient agreeable to disposition. Will close encounter.

## 2015-09-26 NOTE — Telephone Encounter (Signed)
Approval for CT:  [id number].  Approval dates are from May 19th - June 17th.  Please let [facility] be aware of this and see when imaging is scheduled.  Thanks.

## 2016-01-12 ENCOUNTER — Ambulatory Visit (INDEPENDENT_AMBULATORY_CARE_PROVIDER_SITE_OTHER): Payer: BLUE CROSS/BLUE SHIELD

## 2016-01-12 ENCOUNTER — Ambulatory Visit (INDEPENDENT_AMBULATORY_CARE_PROVIDER_SITE_OTHER): Payer: BLUE CROSS/BLUE SHIELD | Admitting: Obstetrics & Gynecology

## 2016-01-12 VITALS — BP 122/88 | HR 80 | Resp 14 | Ht 64.75 in

## 2016-01-12 DIAGNOSIS — N83202 Unspecified ovarian cyst, left side: Secondary | ICD-10-CM

## 2016-01-12 DIAGNOSIS — N644 Mastodynia: Secondary | ICD-10-CM

## 2016-01-12 NOTE — Progress Notes (Signed)
60 y.o. 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  Pt has been experiencing issues with intermittent diarrhea and has seen GI about this.  Has been diagnosed with colitis.  Denies changes with this.  Not interested in a second opinion.  Continues to report, at times, a pulling sensation in the RLQ.  Had CT scan 09/26/15.  Reviewed this with pt.    Reports she is having some breast tenderness and pain tot the touch that she would like evaluated today.  Denies any masses.  No skin changes.  Last MMG 11/16.  This is on the right side.  Denies nipple discharge.  Patient's last menstrual period was 12/06/2011.  Findings:  UTERUS:  6.6 x 4.0 x 3.8cm EMS: 2.2 - 4.35mm ADNEXA: Left ovary: 4.8 x 3.5 x 1.7cm with 2.1 x 1.9cm cyst, 1.1 x 1.1cm cyst, and 0.98 x 1.1cm cyst.  These are all stable and echo free as well as avascular.         Right ovary: not present CUL DE SAC: no free fluid  Physical Exam  Constitutional: She appears well-developed and well-nourished.  Neck: Normal range of motion. Neck supple. No thyromegaly present.  Respiratory: Right breast exhibits tenderness (RUOQ without discrete mass). Right breast exhibits no inverted nipple, no mass, no nipple discharge and no skin change. Left breast exhibits no inverted nipple, no mass, no nipple discharge, no skin change and no tenderness. Breasts are symmetrical.  Lymphadenopathy:    She has no cervical adenopathy.   Discussion:  Findings reviewed.  As the cysts continue to appear benign and stable in size, do not feel surgical excision is necessary.  Pt does not want surgery "unless necessary" and I do not feel that is the case at this time. D/W pt repeating imaging in a year and she is in agreement with this. AEX already scheduled for 2/18..  Assessment:  Stable ovarian cysts on left ovary.  S/P RSO 2002 due  to cystadenoma. Right breast pain with normal findings on exam  Plan:Repeat PUS 1 year Pt will have MMG done on schedule this year.  She is advised to call if pain persists through the next month.  Would proceed with diagnostic imaging early if this is the case.  Pt in agreement with plan.  ~20 minutes spent with patient >50% of time was in face to face discussion of above.

## 2016-01-13 ENCOUNTER — Encounter: Payer: Self-pay | Admitting: Obstetrics & Gynecology

## 2016-03-27 ENCOUNTER — Other Ambulatory Visit: Payer: Self-pay | Admitting: Obstetrics & Gynecology

## 2016-03-27 NOTE — Telephone Encounter (Signed)
Medication refill request: progesterone  Last AEX:  02/18/15 SM Next AEX: 06/12/16 SM  Last MMG (if hormonal medication request): 03/13/16 BIRADS1:Neg  Refill authorized: 02/24/15 #90tabs/4R.  Estrace #90tabs/4R. Today please advise.

## 2016-03-28 MED ORDER — ESTRADIOL 1 MG PO TABS: 1.0000 mg | ORAL_TABLET | Freq: Every day | ORAL | 0 refills | Status: DC

## 2016-05-12 ENCOUNTER — Other Ambulatory Visit: Payer: Self-pay | Admitting: Obstetrics & Gynecology

## 2016-05-14 NOTE — Telephone Encounter (Signed)
Medication refill request: estrace  Last AEX:  02/18/15 SM Next AEX: 06/12/16 SM Last MMG (if hormonal medication request): 03/13/16 BIRADS1:neg  Refill authorized: 03/28/16 #90tabs/0R. Today please advise.

## 2016-06-11 NOTE — Progress Notes (Deleted)
61 y.o. [id number] MarriedCaucasianF here for annual exam.    Patient's last menstrual period was 12/06/2011.          Sexually active: {yes no:314532}  The current method of family planning is vasectomy.    Exercising: {yes no:314532}  {types:19826} Smoker:  no  Health Maintenance: Pap:  01/12/14 negative, HR HPV negative  History of abnormal Pap:  yes MMG:  03/13/16 BIRADS 1 negative  Colonoscopy:  01/25/15 at [facility] in [place] BMD:   03/09/15 normal- in Care Everywhere TDaP:  01/21/13  Pneumonia vaccine(s):  *** Zostavax:   *** Hep C testing: *** Screening Labs: ***, Hb today: ***, Urine today: ***   reports that she has never smoked. She has never used smokeless tobacco. She reports that she drinks about 0.5 oz of alcohol per week . She reports that she does not use drugs.  Past Medical History:  Diagnosis Date  . Abnormal Pap smear   . Anxiety   . Basal cell adenocarcinoma    skin, face  . Colitis 01/05/15   treated wtih antibiotics  . Depression   . GERD (gastroesophageal reflux disease)   . Hypertension    started on medication for LEE  . TIA (transient ischemic attack)    initially question as TIA, evaluation negative.  Neurologist felt this was anxiety related.    Past Surgical History:  Procedure Laterality Date  . CERVICAL BIOPSY  W/ LOOP ELECTRODE EXCISION    . CESAREAN SECTION     2, FTP with fetal distress  . COLONOSCOPY  01/2015   repeat 5 years  . HEMORRHOID BANDING  11/2014   Dr. Tamala Julian, [place]  . OOPHORECTOMY  2001   for recurrent ovarian cyst    Current Outpatient Prescriptions  Medication Sig Dispense Refill  . ALPRAZolam (XANAX) 0.25 MG tablet Take 1 tablet (0.25 mg total) by mouth 3 (three) times daily as needed for sleep or anxiety (Take 1 tablet by mouth every 8 hours as needed for anxiety). 30 tablet 1  . desvenlafaxine (PRISTIQ) 100 MG 24 hr tablet Take by mouth.    . estradiol (ESTRACE) 1 MG tablet TAKE 1 TABLET BY MOUTH EVERY DAY 90  tablet 0  . Multiple Vitamin (MULTIVITAMIN) tablet Take 1 tablet by mouth daily.    Marland Kitchen omeprazole (PRILOSEC) 40 MG capsule TAKE 1 CAPSULE (40 MG TOTAL) BY MOUTH ONCE DAILY.  3  . phentermine 15 MG capsule Take by mouth.    . progesterone (PROMETRIUM) 100 MG capsule TAKE 1 CAPSULE BY MOUTH EVERY NIGHT AT BEDTIME 90 capsule 0  . temazepam (RESTORIL) 30 MG capsule TAKE 1 CAPSULE AT BEDTIME AS NEEDED INSOMNIA 30 capsule 5  . triamterene-hydrochlorothiazide (MAXZIDE) 75-50 MG per tablet Take 1 tablet by mouth daily. 90 tablet 3   No current facility-administered medications for this visit.     Family History  Problem Relation Age of Onset  . Diabetes Mother   . Hypertension Mother   . Heart disease Father   . Diabetes Brother     ROS:  Pertinent items are noted in HPI.  Otherwise, a comprehensive ROS was negative.  Exam:   LMP 12/06/2011   Weight change: @WEIGHTCHANGE @ Height:      Ht Readings from Last 3 Encounters:  01/12/16 5' 4.75" (1.645 m)  07/07/15 5' 4.75" (1.645 m)  02/18/15 5' 4.75" (1.645 m)    General appearance: alert, cooperative and appears stated age Head: Normocephalic, without obvious abnormality, atraumatic Neck: no adenopathy, supple, symmetrical,  trachea midline and thyroid {EXAM; THYROID:18604} Lungs: clear to auscultation bilaterally Breasts: {Exam; breast:13139::"normal appearance, no masses or tenderness"} Heart: regular rate and rhythm Abdomen: soft, non-tender; bowel sounds normal; no masses,  no organomegaly Extremities: extremities normal, atraumatic, no cyanosis or edema Skin: Skin color, texture, turgor normal. No rashes or lesions Lymph nodes: Cervical, supraclavicular, and axillary nodes normal. No abnormal inguinal nodes palpated Neurologic: Grossly normal   Pelvic: External genitalia:  no lesions              Urethra:  normal appearing urethra with no masses, tenderness or lesions              Bartholins and Skenes: normal                  Vagina: normal appearing vagina with normal color and discharge, no lesions              Cervix: {exam; cervix:14595}              Pap taken: {yes no:314532} Bimanual Exam:  Uterus:  {exam; uterus:12215}              Adnexa: {exam; adnexa:12223}               Rectovaginal: Confirms               Anus:  normal sphincter tone, no lesions  Chaperone was present for exam.  A:  Well Woman with normal exam  P:   {plan; gyn:5269::"mammogram","pap smear","return annually or prn"}

## 2016-06-12 ENCOUNTER — Ambulatory Visit: Payer: BLUE CROSS/BLUE SHIELD | Admitting: Obstetrics & Gynecology

## 2016-06-18 ENCOUNTER — Ambulatory Visit: Payer: BLUE CROSS/BLUE SHIELD | Admitting: Obstetrics & Gynecology

## 2016-06-18 ENCOUNTER — Telehealth: Payer: Self-pay | Admitting: Obstetrics & Gynecology

## 2016-06-18 NOTE — Progress Notes (Deleted)
61 y.o. [id number] MarriedCaucasianF here for annual exam.    Patient's last menstrual period was 12/06/2011.          Sexually active: {yes no:314532}  The current method of family planning is vasectomy.    Exercising: {yes no:314532}  {types:19826} Smoker:  no  Health Maintenance: Pap:  01/12/14 negative, HR HPV negative  History of abnormal Pap:  yes MMG:  03/13/16 BIRADS 1 negative  Colonoscopy:  01/25/15- repeat 5 years  BMD:   2011  TDaP:  01/21/13  Pneumonia vaccine(s):  *** Zostavax:   *** Hep C testing: *** Screening Labs: ***, Hb today: ***, Urine today: ***   reports that she has never smoked. She has never used smokeless tobacco. She reports that she drinks about 0.5 oz of alcohol per week . She reports that she does not use drugs.  Past Medical History:  Diagnosis Date  . Abnormal Pap smear   . Anxiety   . Basal cell adenocarcinoma    skin, face  . Colitis 01/05/15   treated wtih antibiotics  . Depression   . GERD (gastroesophageal reflux disease)   . Hypertension    started on medication for LEE  . TIA (transient ischemic attack)    initially question as TIA, evaluation negative.  Neurologist felt this was anxiety related.    Past Surgical History:  Procedure Laterality Date  . CERVICAL BIOPSY  W/ LOOP ELECTRODE EXCISION    . CESAREAN SECTION     2, FTP with fetal distress  . COLONOSCOPY  01/2015   repeat 5 years  . HEMORRHOID BANDING  11/2014   Dr. Tamala Julian, [place]  . OOPHORECTOMY  2001   for recurrent ovarian cyst    Current Outpatient Prescriptions  Medication Sig Dispense Refill  . ALPRAZolam (XANAX) 0.25 MG tablet Take 1 tablet (0.25 mg total) by mouth 3 (three) times daily as needed for sleep or anxiety (Take 1 tablet by mouth every 8 hours as needed for anxiety). 30 tablet 1  . desvenlafaxine (PRISTIQ) 100 MG 24 hr tablet Take by mouth.    . estradiol (ESTRACE) 1 MG tablet TAKE 1 TABLET BY MOUTH EVERY DAY 90 tablet 0  . Multiple Vitamin  (MULTIVITAMIN) tablet Take 1 tablet by mouth daily.    Marland Kitchen omeprazole (PRILOSEC) 40 MG capsule TAKE 1 CAPSULE (40 MG TOTAL) BY MOUTH ONCE DAILY.  3  . phentermine 15 MG capsule Take by mouth.    . progesterone (PROMETRIUM) 100 MG capsule TAKE 1 CAPSULE BY MOUTH EVERY NIGHT AT BEDTIME 90 capsule 0  . temazepam (RESTORIL) 30 MG capsule TAKE 1 CAPSULE AT BEDTIME AS NEEDED INSOMNIA 30 capsule 5  . triamterene-hydrochlorothiazide (MAXZIDE) 75-50 MG per tablet Take 1 tablet by mouth daily. 90 tablet 3   No current facility-administered medications for this visit.     Family History  Problem Relation Age of Onset  . Diabetes Mother   . Hypertension Mother   . Heart disease Father   . Diabetes Brother     ROS:  Pertinent items are noted in HPI.  Otherwise, a comprehensive ROS was negative.  Exam:   LMP 12/06/2011   Weight change: @WEIGHTCHANGE @ Height:      Ht Readings from Last 3 Encounters:  01/12/16 5' 4.75" (1.645 m)  07/07/15 5' 4.75" (1.645 m)  02/18/15 5' 4.75" (1.645 m)    General appearance: alert, cooperative and appears stated age Head: Normocephalic, without obvious abnormality, atraumatic Neck: no adenopathy, supple, symmetrical, trachea midline and thyroid {  EXAM; [id number] Lungs: clear to auscultation bilaterally Breasts: {Exam; breast:13139::"normal appearance, no masses or tenderness"} Heart: regular rate and rhythm Abdomen: soft, non-tender; bowel sounds normal; no masses,  no organomegaly Extremities: extremities normal, atraumatic, no cyanosis or edema Skin: Skin color, texture, turgor normal. No rashes or lesions Lymph nodes: Cervical, supraclavicular, and axillary nodes normal. No abnormal inguinal nodes palpated Neurologic: Grossly normal   Pelvic: External genitalia:  no lesions              Urethra:  normal appearing urethra with no masses, tenderness or lesions              Bartholins and Skenes: normal                 Vagina: normal appearing vagina  with normal color and discharge, no lesions              Cervix: {exam; cervix:14595}              Pap taken: {yes no:314532} Bimanual Exam:  Uterus:  {exam; uterus:12215}              Adnexa: {exam; adnexa:12223}               Rectovaginal: Confirms               Anus:  normal sphincter tone, no lesions  Chaperone was present for exam.  A:  Well Woman with normal exam  P:   {plan; gyn:5269::"mammogram","pap smear","return annually or prn"}

## 2016-06-18 NOTE — Telephone Encounter (Signed)
Patient canceled her aex today due to illness. Patient rescheduled to 07/17/16 at 10:45am.

## 2016-07-03 DIAGNOSIS — L570 Actinic keratosis: Secondary | ICD-10-CM | POA: Diagnosis not present

## 2016-07-03 DIAGNOSIS — L578 Other skin changes due to chronic exposure to nonionizing radiation: Secondary | ICD-10-CM | POA: Diagnosis not present

## 2016-07-03 DIAGNOSIS — L814 Other melanin hyperpigmentation: Secondary | ICD-10-CM | POA: Diagnosis not present

## 2016-07-03 DIAGNOSIS — Z85828 Personal history of other malignant neoplasm of skin: Secondary | ICD-10-CM | POA: Diagnosis not present

## 2016-07-04 DIAGNOSIS — L570 Actinic keratosis: Secondary | ICD-10-CM | POA: Diagnosis not present

## 2016-07-17 ENCOUNTER — Ambulatory Visit: Payer: BLUE CROSS/BLUE SHIELD | Admitting: Obstetrics & Gynecology

## 2016-07-23 NOTE — Progress Notes (Deleted)
61 y.o. G27P2012 Married Caucasian F here for annual exam.    Patient's last menstrual period was 12/06/2011.          Sexually active: {yes no:314532}  The current method of family planning is post menopausal status.    Exercising: {yes no:314532}  {types:19826} Smoker:  no  Health Maintenance: Pap:  01/12/14 negative, HR HPV negative  History of abnormal Pap:  yes MMG:  03/13/16 BIRADS 1 negative  Colonoscopy:  01/25/15- repeat 5 years  BMD:   *** TDaP:  01/21/13  Pneumonia vaccine(s):  *** Zostavax:   *** Hep C testing: *** Screening Labs: ***, Hb today: ***   reports that she has never smoked. She has never used smokeless tobacco. She reports that she drinks about 0.5 oz of alcohol per week . She reports that she does not use drugs.  Past Medical History:  Diagnosis Date  . Abnormal Pap smear   . Anxiety   . Basal cell adenocarcinoma    skin, face  . Colitis 01/05/15   treated wtih antibiotics  . Depression   . GERD (gastroesophageal reflux disease)   . Hypertension    started on medication for LEE  . TIA (transient ischemic attack)    initially question as TIA, evaluation negative.  Neurologist felt this was anxiety related.    Past Surgical History:  Procedure Laterality Date  . CERVICAL BIOPSY  W/ LOOP ELECTRODE EXCISION    . CESAREAN SECTION     2, FTP with fetal distress  . COLONOSCOPY  01/2015   repeat 5 years  . HEMORRHOID BANDING  11/2014   Dr. Tamala Julian, [place]  . OOPHORECTOMY  2001   for recurrent ovarian cyst    Current Outpatient Prescriptions  Medication Sig Dispense Refill  . ALPRAZolam (XANAX) 0.25 MG tablet Take 1 tablet (0.25 mg total) by mouth 3 (three) times daily as needed for sleep or anxiety (Take 1 tablet by mouth every 8 hours as needed for anxiety). 30 tablet 1  . desvenlafaxine (PRISTIQ) 100 MG 24 hr tablet Take by mouth.    . estradiol (ESTRACE) 1 MG tablet TAKE 1 TABLET BY MOUTH EVERY DAY 90 tablet 0  . Multiple Vitamin (MULTIVITAMIN)  tablet Take 1 tablet by mouth daily.    Marland Kitchen omeprazole (PRILOSEC) 40 MG capsule TAKE 1 CAPSULE (40 MG TOTAL) BY MOUTH ONCE DAILY.  3  . phentermine 15 MG capsule Take by mouth.    . progesterone (PROMETRIUM) 100 MG capsule TAKE 1 CAPSULE BY MOUTH EVERY NIGHT AT BEDTIME 90 capsule 0  . temazepam (RESTORIL) 30 MG capsule TAKE 1 CAPSULE AT BEDTIME AS NEEDED INSOMNIA 30 capsule 5  . triamterene-hydrochlorothiazide (MAXZIDE) 75-50 MG per tablet Take 1 tablet by mouth daily. 90 tablet 3   No current facility-administered medications for this visit.     Family History  Problem Relation Age of Onset  . Diabetes Mother   . Hypertension Mother   . Heart disease Father   . Diabetes Brother     ROS:  Pertinent items are noted in HPI.  Otherwise, a comprehensive ROS was negative.  Exam:   LMP 12/06/2011   Weight change: @WEIGHTCHANGE @ Height:      Ht Readings from Last 3 Encounters:  01/12/16 5' 4.75" (1.645 m)  07/07/15 5' 4.75" (1.645 m)  02/18/15 5' 4.75" (1.645 m)    General appearance: alert, cooperative and appears stated age Head: Normocephalic, without obvious abnormality, atraumatic Neck: no adenopathy, supple, symmetrical, trachea midline and thyroid {  EXAM; IRJJOAC:16606} Lungs: clear to auscultation bilaterally Breasts: {Exam; breast:13139::"normal appearance, no masses or tenderness"} Heart: regular rate and rhythm Abdomen: soft, non-tender; bowel sounds normal; no masses,  no organomegaly Extremities: extremities normal, atraumatic, no cyanosis or edema Skin: Skin color, texture, turgor normal. No rashes or lesions Lymph nodes: Cervical, supraclavicular, and axillary nodes normal. No abnormal inguinal nodes palpated Neurologic: Grossly normal   Pelvic: External genitalia:  no lesions              Urethra:  normal appearing urethra with no masses, tenderness or lesions              Bartholins and Skenes: normal                 Vagina: normal appearing vagina with normal  color and discharge, no lesions              Cervix: {exam; cervix:14595}              Pap taken: {yes no:314532} Bimanual Exam:  Uterus:  {exam; uterus:12215}              Adnexa: {exam; adnexa:12223}               Rectovaginal: Confirms               Anus:  normal sphincter tone, no lesions  Chaperone was present for exam.  A:  Well Woman with normal exam  P:   {plan; gyn:5269::"mammogram","pap smear","return annually or prn"}

## 2016-07-24 ENCOUNTER — Ambulatory Visit: Payer: BLUE CROSS/BLUE SHIELD | Admitting: Obstetrics & Gynecology

## 2016-07-30 ENCOUNTER — Other Ambulatory Visit: Payer: Self-pay | Admitting: *Deleted

## 2016-07-30 ENCOUNTER — Ambulatory Visit (INDEPENDENT_AMBULATORY_CARE_PROVIDER_SITE_OTHER): Payer: BLUE CROSS/BLUE SHIELD | Admitting: Obstetrics & Gynecology

## 2016-07-30 ENCOUNTER — Encounter: Payer: Self-pay | Admitting: Obstetrics & Gynecology

## 2016-07-30 ENCOUNTER — Other Ambulatory Visit (HOSPITAL_COMMUNITY)
Admission: RE | Admit: 2016-07-30 | Discharge: 2016-07-30 | Disposition: A | Discharge status: Home / Self Care | Payer: BLUE CROSS/BLUE SHIELD | Source: Ambulatory Visit | Attending: Obstetrics & Gynecology | Admitting: Obstetrics & Gynecology

## 2016-07-30 VITALS — BP 120/80 | HR 64 | Resp 14 | Ht 65.0 in | Wt 194.5 lb

## 2016-07-30 DIAGNOSIS — Z01419 Encounter for gynecological examination (general) (routine) without abnormal findings: Secondary | ICD-10-CM

## 2016-07-30 DIAGNOSIS — Z01411 Encounter for gynecological examination (general) (routine) with abnormal findings: Secondary | ICD-10-CM | POA: Insufficient documentation

## 2016-07-30 DIAGNOSIS — R1012 Left upper quadrant pain: Secondary | ICD-10-CM

## 2016-07-30 DIAGNOSIS — Z205 Contact with and (suspected) exposure to viral hepatitis: Secondary | ICD-10-CM

## 2016-07-30 DIAGNOSIS — Z124 Encounter for screening for malignant neoplasm of cervix: Secondary | ICD-10-CM

## 2016-07-30 MED ORDER — PROGESTERONE MICRONIZED 100 MG PO CAPS: 100.0000 mg | ORAL_CAPSULE | Freq: Every day | ORAL | 4 refills | Status: DC

## 2016-07-30 MED ORDER — ESTRADIOL 1 MG PO TABS: 1.0000 mg | ORAL_TABLET | Freq: Every day | ORAL | 4 refills | Status: DC

## 2016-07-30 NOTE — Progress Notes (Signed)
61 y.o. G44P2012 Married Caucasian F here for annual exam.  Reports she recently was treated with "chemo cream" to face for [facility] and [facility].  She is not completely sure why she was treated.  No vaginal bleeding.      H/O mild colitis.  Last colonoscopy was 2016.  Did a detox this year.  Feels like she may be sensitive to dairy.  Having much less pain, gas, bloating.  Very pleased.     Patient's last menstrual period was 12/06/2011.          Sexually active: Yes.    The current method of family planning is vasectomy.    Exercising: Yes.    spin Smoker:  no  Health Maintenance: Pap:  01/12/14 negative, HR HPV negative  History of abnormal Pap:  yes MMG:  03/13/16 BIRADS 1 negative  Colonoscopy:  01/25/15- repeat 5 years- "mild colitis" BMD:   09/2009 TDaP:  01/21/13  Pneumonia vaccine(s):  Never Zostavax:   Will considering getting this next year Hep C testing: draw at end of appointment Screening Labs: draw at end of appointment, Hb today: same   reports that she has never smoked. She has never used smokeless tobacco. She reports that she drinks about 0.5 oz of alcohol per week . She reports that she does not use drugs.  Past Medical History:  Diagnosis Date  . Abnormal Pap smear   . Anxiety   . Basal cell adenocarcinoma    skin, face  . Colitis 01/05/15   treated wtih antibiotics  . Depression   . GERD (gastroesophageal reflux disease)   . Hypertension    started on medication for LEE  . TIA (transient ischemic attack)    initially question as TIA, evaluation negative.  Neurologist felt this was anxiety related.    Past Surgical History:  Procedure Laterality Date  . CERVICAL BIOPSY  W/ LOOP ELECTRODE EXCISION    . CESAREAN SECTION     2, FTP with fetal distress  . COLONOSCOPY  01/2015   repeat 5 years  . HEMORRHOID BANDING  11/2014   Dr. Tamala Julian, [place]  . OOPHORECTOMY  2001   for recurrent ovarian cyst    Current Outpatient Prescriptions  Medication Sig Dispense Refill   . ALPRAZolam (XANAX) 0.25 MG tablet Take 1 tablet (0.25 mg total) by mouth 3 (three) times daily as needed for sleep or anxiety (Take 1 tablet by mouth every 8 hours as needed for anxiety). 30 tablet 1  . Cholecalciferol (VITAMIN D) 2000 units tablet Take by mouth.    . estradiol (ESTRACE) 1 MG tablet TAKE 1 TABLET BY MOUTH EVERY DAY 90 tablet 0  . fluorouracil (EFUDEX) 5 % cream APP EXT TO FACE D FOR 2 WKS  0  . furosemide (LASIX) 20 MG tablet Take by mouth.    Marland Kitchen MAGNESIUM PO Take by mouth daily.    . Multiple Vitamin (MULTIVITAMIN) tablet Take 1 tablet by mouth daily.    . potassium chloride (MICRO-K) 10 MEQ CR capsule Take by mouth.    . progesterone (PROMETRIUM) 100 MG capsule TAKE 1 CAPSULE BY MOUTH EVERY NIGHT AT BEDTIME 90 capsule 0  . sertraline (ZOLOFT) 50 MG tablet Take by mouth.    . temazepam (RESTORIL) 30 MG capsule TAKE 1 CAPSULE AT BEDTIME AS NEEDED INSOMNIA 30 capsule 5   No current facility-administered medications for this visit.     Family History  Problem Relation Age of Onset  . Diabetes Mother   .  Hypertension Mother   . Heart disease Father   . Diabetes Brother     ROS:  Pertinent items are noted in HPI.  Otherwise, a comprehensive ROS was negative.h  Exam:   BP 120/80 (BP Location: Right Arm, Patient Position: Sitting, Cuff Size: Normal)   Pulse 64   Resp 14   Ht 5\' 5"  (1.651 m)   Wt 194 lb 8 oz (88.2 kg)   LMP 12/06/2011   BMI 32.37 kg/m   Weight change:    Height: 5\' 5"  (165.1 cm)  Ht Readings from Last 3 Encounters:  07/30/16 5\' 5"  (1.651 m)  01/12/16 5' 4.75" (1.645 m)  07/07/15 5' 4.75" (1.645 m)    General appearance: alert, cooperative and appears stated age Head: Normocephalic, without obvious abnormality, atraumatic Neck: no adenopathy, supple, symmetrical, trachea midline and thyroid normal to inspection and palpation Lungs: clear to auscultation bilaterally Breasts: normal appearance, no masses or tenderness Heart: regular rate and  rhythm Abdomen: soft, non-tender; bowel sounds normal; no masses,  no organomegaly Extremities: extremities normal, atraumatic, no cyanosis or edema Skin: Skin color, texture, turgor normal. No rashes or lesions Lymph nodes: Cervical, supraclavicular, and axillary nodes normal. No abnormal inguinal nodes palpated Neurologic: Grossly normal   Pelvic: External genitalia:  no lesions              Urethra:  normal appearing urethra with no masses, tenderness or lesions              Bartholins and Skenes: normal                 Vagina: normal appearing vagina with normal color and discharge, no lesions              Cervix: no lesions              Pap taken: Yes.   Bimanual Exam:  Uterus:  normal size, contour, position, consistency, mobility, non-tender              Adnexa: normal adnexa and no mass, fullness, tenderness               Rectovaginal: Confirms               Anus:  normal sphincter tone, no lesions  Chaperone was present for exam.  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  Pt is UTD Plan BMD with next MMG pap smear obtained today.  Plan pap and HR HPV with next visit so pt will be on every three year pap/HR HPV testing Plan abd ultrasound due to abdominal pain Hep C antibody will be obtained today RF estradiol 1.0mg  daily and Prometrium 100mg  daily.  #90/4RF of each.  Risks and benefits reviewed.  Pt does NOT want to stop HRT. return annually or prn

## 2016-07-30 NOTE — Progress Notes (Unsigned)
Patient scheduled while in office for complete abdominal ultrasound for LUQ pain. Patient scheduled at [facility] for 08/03/16 at 10:15am. Patient advised not to eat or drink after midnight, arrive 15 minutes early. Patient verbalizes understanding and is agreeable to date and time.

## 2016-07-31 LAB — HEPATITIS C ANTIBODY: HCV Ab: NEGATIVE

## 2016-08-01 LAB — CYTOLOGY - PAP: Diagnosis: NEGATIVE

## 2016-08-03 ENCOUNTER — Ambulatory Visit: Payer: BLUE CROSS/BLUE SHIELD

## 2016-08-30 DIAGNOSIS — L820 Inflamed seborrheic keratosis: Secondary | ICD-10-CM | POA: Diagnosis not present

## 2016-08-30 DIAGNOSIS — L821 Other seborrheic keratosis: Secondary | ICD-10-CM | POA: Diagnosis not present

## 2016-11-05 ENCOUNTER — Telehealth: Payer: Self-pay | Admitting: Obstetrics & Gynecology

## 2016-11-05 DIAGNOSIS — N83202 Unspecified ovarian cyst, left side: Secondary | ICD-10-CM

## 2016-11-05 DIAGNOSIS — R102 Pelvic and perineal pain: Secondary | ICD-10-CM

## 2016-11-05 NOTE — Telephone Encounter (Signed)
I think PUS this week is fine.  Please give precautions for going to ER if symptoms worsen.  Thanks.

## 2016-11-05 NOTE — Telephone Encounter (Signed)
Patient would like to schedule an appointment for a ultrasound.

## 2016-11-05 NOTE — Telephone Encounter (Signed)
Spoke with patient, advised as seen below per Dr. Sabra Heck. OV cancelled for 7/6, scheduled for PUS on 7/5 at 1pm with consult to follow at 1:30pm with Dr. Sabra Heck. Patient states she has new insurance information she needs to provide, advised I would update our insurance/benefits department, they will return call back. ER precautions reviewed. Patient verbalizes understanding and is agreeable.   Order placed for PUS.   Routing to provider for final review. Patient is agreeable to disposition. Will close encounter.   Cc: Lerry Liner

## 2016-11-05 NOTE — Telephone Encounter (Signed)
Spoke with patient. Patient calling to schedule [place]. Patient states she did not have recommended abdominal US done for LUQ pain d/t cost, would like to schedule f/u PUS for left ovarian cyst due in September. Patient reports she is having same pain she has had before, has restarted in last few days. Reports low throbbing pelvic pain at bikini line. Denies fever, chills, nausea, vomiting, bleeding or urinary complaints. Reports was unsure at last AEX if r/t IBS or ovaries. Recommended OV for further evaluation as PUS may need to be scheduled earlier than September d/t symptoms. Patient scheduled for OV on 11/09/16 at 3pm with Dr. Sabra Heck, will return call with any additional recommendations after reviewed by Dr. Sabra Heck. Patient is agreeable.   Dr. Sabra Heck, keep as OV or schedule earlier as PUS?

## 2016-11-06 ENCOUNTER — Telehealth: Payer: Self-pay | Admitting: Obstetrics & Gynecology

## 2016-11-06 NOTE — Telephone Encounter (Signed)
Call placed to patient in regards to scheduled appointment on 11/08/16. BC[employer] insurance on file has terminated as of 09/04/16. Left a voicemail, requesting a return call. Upon return call will  need to obtain new insurance information for pre-certification.

## 2016-11-08 ENCOUNTER — Ambulatory Visit (INDEPENDENT_AMBULATORY_CARE_PROVIDER_SITE_OTHER): Payer: BLUE CROSS/BLUE SHIELD | Admitting: Obstetrics & Gynecology

## 2016-11-08 ENCOUNTER — Ambulatory Visit (INDEPENDENT_AMBULATORY_CARE_PROVIDER_SITE_OTHER): Payer: BLUE CROSS/BLUE SHIELD

## 2016-11-08 VITALS — BP 118/60 | HR 68 | Resp 16 | Ht 65.0 in | Wt 194.0 lb

## 2016-11-08 DIAGNOSIS — N83202 Unspecified ovarian cyst, left side: Secondary | ICD-10-CM

## 2016-11-08 DIAGNOSIS — R102 Pelvic and perineal pain: Secondary | ICD-10-CM

## 2016-11-08 DIAGNOSIS — R1032 Left lower quadrant pain: Secondary | ICD-10-CM | POA: Diagnosis not present

## 2016-11-08 NOTE — Telephone Encounter (Signed)
Patient returned call. Spoke with pt regarding benefit for ultrasound. Patient understood and agreeable. Patient scheduled 11/08/16 with Dr Sabra Heck. Patient aware of date, arrival and time. . No further questions. Ok to close

## 2016-11-08 NOTE — Telephone Encounter (Signed)
Patient returned call and provided new BCBS ID number for pre-certification of scheduled appointment today. Advised patient I will obtain the pre-certification and call patient back to provide benefits under the new [employer] plan.  Call placed to patient today at 10:34 AM to convey benefits for new [employer] (see account notes). Left message on voicemail requesting a return call.

## 2016-11-08 NOTE — Progress Notes (Signed)
GYNECOLOGY  VISIT   HPI: 61 y.o. G46P2012 Married Caucasian female here for complaint of intermittent LLQ pain that has been on-going over the last two years.  Pt feels like this pain is in the same place but on the opposite side as when she had a right enlarged ovary.  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  Pt has undergone several ultrasounds in the past two years.  Current LLQ pain seems to begin in the summer of 2016 when she experienced severe LLQ pain that required hospital/emergent evaluation at [facility].  She was evaluated with CT on 01/05/15 in the ER showing mild colitis.  A 2cm left ovarian cyst was noted on CT at the same time.  She was treated for the colitis but intermittent pain has persisted since that time.  Pt does have hx of chronic constipation.  Ultrasound findings today: Uterus:  7.9 x 3.9 x 3.2cm Endometrium:  5.76mm Left ovary:  3.8 x 3.4 x 1.7cm with 2.2 x 1.7cm, 1.4 x 1.3cm and 1.3 x 0.9cm simple appearing cysts (this is essentially unchanged from 3/17 ultrasound) Right ovary:  Surgically absent [place]:  No free fluid noted  GYNECOLOGIC HISTORY: Patient's last menstrual period was 12/06/2011. Contraception: PMP Menopausal hormone therapy: none  Patient Active Problem List   Diagnosis Date Noted  . Noninfectious gastroenteritis and colitis 02/24/2015  . External hemorrhoid 09/23/2014  . Concentration deficit 12/18/2013  . Attention and concentration deficit 12/18/2013  . Edema 10/15/2013  . Intention tremor 01/26/2013  . 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 Insomnia 09/24/2012  . Other and unspecified hyperlipidemia 08/28/2012  . Obesity (BMI 30-39.9) 08/28/2012  . Adiposity 08/28/2012  . ANXIETY 05/19/2010  . DEPRESSION 05/19/2010  . GERD 05/19/2010  . Essential (primary) hypertension 05/19/2010  . 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 Abnormal Pap  smear   . Anxiety   . Basal cell adenocarcinoma    skin, face  . Colitis 01/05/15   treated wtih antibiotics  . Depression   . GERD (gastroesophageal reflux disease)   . Hypertension    started on medication for LEE  . TIA (transient ischemic attack)    initially question as TIA, evaluation negative.  Neurologist felt this was anxiety related.    Past Surgical History:  Procedure Laterality Date  . CERVICAL BIOPSY  W/ LOOP ELECTRODE EXCISION    . CESAREAN SECTION     2, FTP with fetal distress  . COLONOSCOPY  01/2015   repeat 5 years  . HEMORRHOID BANDING  11/2014   Dr. Tamala Julian, [place]  . OOPHORECTOMY  2001   for recurrent ovarian cyst    MEDS:  Reviewed in EPIC and UTD  ALLERGIES: Penicillins  Family History  Problem Relation Age of Onset  . Diabetes Mother   . Hypertension Mother   . Heart disease Father   . Diabetes Brother     SH:  Married, non smoker  Review of Systems  Constitutional: Negative.   Gastrointestinal:       Intermittent LLQ pain  Genitourinary: Negative.     PHYSICAL EXAMINATION:    BP 118/60 (BP Location: Right Arm, Patient Position: Sitting, Cuff Size: Large)   Pulse 68   Resp 16   Ht 5\' 5"  (1.651 m)   Wt 194 lb (88 kg)   LMP 12/06/2011   BMI 32.28 kg/m     Physical Exam  Constitutional: She is oriented to person, place, and time. She appears well-developed and well-nourished.  GI: Soft. Bowel sounds are normal. She exhibits no distension and no mass. There is no tenderness. There is no rebound and no guarding.  Neurological: She is alert and oriented to person, place, and time.  Skin: Skin is warm and dry.  Psychiatric: She has a normal mood and affect.    Assessment: Intermittent LLQ pain Three simple, stable ovarian cysts on the left ovary H/o mild colitis 8/16 when pain seems to begin H/o constipation  Plan: As ovarian cysts are stable in size since last PUS 3/17, I really feel the intermittent pain is GI related and not  related to these cysts.  I would really like a second opinion from GI regarding this.  Pt is completely willing to do this as she never had a good relationship with prior GI and was not planning on returning to him in the future anyway.  Referral will be made to Dr. Collene Mares.   ~15 minutes spent with patient >50% of time was in face to face discussion of above.

## 2016-11-08 NOTE — Telephone Encounter (Signed)
2nd call placed to patient in regards to scheduled ultrasound appointment. Will need patients new insurance information for pre-certification purposes. Left voicemail requesting a return call.

## 2016-11-09 ENCOUNTER — Ambulatory Visit: Payer: Self-pay | Admitting: Obstetrics & Gynecology

## 2016-11-12 ENCOUNTER — Encounter: Payer: Self-pay | Admitting: Obstetrics & Gynecology

## 2016-11-12 DIAGNOSIS — R1032 Left lower quadrant pain: Secondary | ICD-10-CM | POA: Insufficient documentation

## 2016-11-12 DIAGNOSIS — N83202 Unspecified ovarian cyst, left side: Secondary | ICD-10-CM | POA: Insufficient documentation

## 2016-11-20 DIAGNOSIS — J4540 Moderate persistent asthma, uncomplicated: Secondary | ICD-10-CM | POA: Diagnosis not present

## 2016-11-20 DIAGNOSIS — K580 Irritable bowel syndrome with diarrhea: Secondary | ICD-10-CM | POA: Diagnosis not present

## 2017-02-01 ENCOUNTER — Telehealth: Payer: Self-pay | Admitting: Obstetrics & Gynecology

## 2017-02-01 NOTE — Telephone Encounter (Signed)
Please disregard made in error °

## 2017-02-04 ENCOUNTER — Telehealth: Payer: Self-pay | Admitting: Obstetrics & Gynecology

## 2017-02-04 NOTE — Telephone Encounter (Signed)
Carla Fuentes was scheduled for 11/29/16 with Dr. Collene Mares. She canceled this appointment and did not want to reschedule.

## 2017-03-12 DIAGNOSIS — M9902 Segmental and somatic dysfunction of thoracic region: Secondary | ICD-10-CM | POA: Diagnosis not present

## 2017-03-12 DIAGNOSIS — M9903 Segmental and somatic dysfunction of lumbar region: Secondary | ICD-10-CM | POA: Diagnosis not present

## 2017-03-12 DIAGNOSIS — M5136 Other intervertebral disc degeneration, lumbar region: Secondary | ICD-10-CM | POA: Diagnosis not present

## 2017-03-12 DIAGNOSIS — M62830 Muscle spasm of back: Secondary | ICD-10-CM | POA: Diagnosis not present

## 2017-03-15 DIAGNOSIS — R079 Chest pain, unspecified: Secondary | ICD-10-CM | POA: Diagnosis not present

## 2017-03-15 DIAGNOSIS — M940 Chondrocostal junction syndrome [Tietze]: Secondary | ICD-10-CM | POA: Diagnosis not present

## 2017-03-15 DIAGNOSIS — Z1231 Encounter for screening mammogram for malignant neoplasm of breast: Secondary | ICD-10-CM | POA: Diagnosis not present

## 2017-03-22 DIAGNOSIS — Z0000 Encounter for general adult medical examination without abnormal findings: Secondary | ICD-10-CM | POA: Diagnosis not present

## 2017-03-22 DIAGNOSIS — I10 Essential (primary) hypertension: Secondary | ICD-10-CM | POA: Diagnosis not present

## 2017-04-22 ENCOUNTER — Encounter: Payer: Self-pay | Admitting: Obstetrics & Gynecology

## 2017-04-23 DIAGNOSIS — M5136 Other intervertebral disc degeneration, lumbar region: Secondary | ICD-10-CM | POA: Diagnosis not present

## 2017-04-23 DIAGNOSIS — M62830 Muscle spasm of back: Secondary | ICD-10-CM | POA: Diagnosis not present

## 2017-04-23 DIAGNOSIS — M9902 Segmental and somatic dysfunction of thoracic region: Secondary | ICD-10-CM | POA: Diagnosis not present

## 2017-04-23 DIAGNOSIS — M9903 Segmental and somatic dysfunction of lumbar region: Secondary | ICD-10-CM | POA: Diagnosis not present

## 2017-05-06 DIAGNOSIS — M5136 Other intervertebral disc degeneration, lumbar region: Secondary | ICD-10-CM | POA: Diagnosis not present

## 2017-05-06 DIAGNOSIS — M9902 Segmental and somatic dysfunction of thoracic region: Secondary | ICD-10-CM | POA: Diagnosis not present

## 2017-05-06 DIAGNOSIS — M9903 Segmental and somatic dysfunction of lumbar region: Secondary | ICD-10-CM | POA: Diagnosis not present

## 2017-05-06 DIAGNOSIS — M62830 Muscle spasm of back: Secondary | ICD-10-CM | POA: Diagnosis not present

## 2017-05-08 DIAGNOSIS — M9902 Segmental and somatic dysfunction of thoracic region: Secondary | ICD-10-CM | POA: Diagnosis not present

## 2017-05-08 DIAGNOSIS — M5136 Other intervertebral disc degeneration, lumbar region: Secondary | ICD-10-CM | POA: Diagnosis not present

## 2017-05-08 DIAGNOSIS — M62830 Muscle spasm of back: Secondary | ICD-10-CM | POA: Diagnosis not present

## 2017-05-08 DIAGNOSIS — M9903 Segmental and somatic dysfunction of lumbar region: Secondary | ICD-10-CM | POA: Diagnosis not present

## 2017-05-13 DIAGNOSIS — M5136 Other intervertebral disc degeneration, lumbar region: Secondary | ICD-10-CM | POA: Diagnosis not present

## 2017-05-13 DIAGNOSIS — M62830 Muscle spasm of back: Secondary | ICD-10-CM | POA: Diagnosis not present

## 2017-05-13 DIAGNOSIS — M9903 Segmental and somatic dysfunction of lumbar region: Secondary | ICD-10-CM | POA: Diagnosis not present

## 2017-05-13 DIAGNOSIS — M9902 Segmental and somatic dysfunction of thoracic region: Secondary | ICD-10-CM | POA: Diagnosis not present

## 2017-05-14 DIAGNOSIS — M9902 Segmental and somatic dysfunction of thoracic region: Secondary | ICD-10-CM | POA: Diagnosis not present

## 2017-05-14 DIAGNOSIS — M62830 Muscle spasm of back: Secondary | ICD-10-CM | POA: Diagnosis not present

## 2017-05-14 DIAGNOSIS — M9903 Segmental and somatic dysfunction of lumbar region: Secondary | ICD-10-CM | POA: Diagnosis not present

## 2017-05-14 DIAGNOSIS — M5136 Other intervertebral disc degeneration, lumbar region: Secondary | ICD-10-CM | POA: Diagnosis not present

## 2017-05-22 DIAGNOSIS — M9903 Segmental and somatic dysfunction of lumbar region: Secondary | ICD-10-CM | POA: Diagnosis not present

## 2017-05-22 DIAGNOSIS — M9902 Segmental and somatic dysfunction of thoracic region: Secondary | ICD-10-CM | POA: Diagnosis not present

## 2017-05-22 DIAGNOSIS — M5136 Other intervertebral disc degeneration, lumbar region: Secondary | ICD-10-CM | POA: Diagnosis not present

## 2017-05-22 DIAGNOSIS — M62830 Muscle spasm of back: Secondary | ICD-10-CM | POA: Diagnosis not present

## 2017-05-24 DIAGNOSIS — M9902 Segmental and somatic dysfunction of thoracic region: Secondary | ICD-10-CM | POA: Diagnosis not present

## 2017-05-24 DIAGNOSIS — M5136 Other intervertebral disc degeneration, lumbar region: Secondary | ICD-10-CM | POA: Diagnosis not present

## 2017-05-24 DIAGNOSIS — M62830 Muscle spasm of back: Secondary | ICD-10-CM | POA: Diagnosis not present

## 2017-05-24 DIAGNOSIS — M9903 Segmental and somatic dysfunction of lumbar region: Secondary | ICD-10-CM | POA: Diagnosis not present

## 2017-09-05 DIAGNOSIS — D2262 Melanocytic nevi of left upper limb, including shoulder: Secondary | ICD-10-CM | POA: Diagnosis not present

## 2017-09-05 DIAGNOSIS — L988 Other specified disorders of the skin and subcutaneous tissue: Secondary | ICD-10-CM | POA: Diagnosis not present

## 2017-09-05 DIAGNOSIS — L821 Other seborrheic keratosis: Secondary | ICD-10-CM | POA: Diagnosis not present

## 2017-09-05 DIAGNOSIS — D225 Melanocytic nevi of trunk: Secondary | ICD-10-CM | POA: Diagnosis not present

## 2017-09-14 ENCOUNTER — Other Ambulatory Visit: Payer: Self-pay | Admitting: Obstetrics & Gynecology

## 2017-09-16 NOTE — Telephone Encounter (Signed)
Medication refill request: Progesterone 100mg  #90 Last AEX:  07-30-16 Next AEX: 10-25-17 Last MMG (if hormonal medication request): 03-15-17 [id number] Refill authorized: please advise

## 2017-09-17 DIAGNOSIS — Z0000 Encounter for general adult medical examination without abnormal findings: Secondary | ICD-10-CM | POA: Diagnosis not present

## 2017-09-28 ENCOUNTER — Other Ambulatory Visit: Payer: Self-pay | Admitting: Obstetrics & Gynecology

## 2017-10-01 NOTE — Telephone Encounter (Signed)
Medication refill request: estradiol  Last AEX:  07/30/16 SM  Next AEX: 10/25/17  Last MMG (if hormonal medication request): 03/15/17 BIRADS 1 negative  Refill authorized: 07/30/16 #90, 4RF. Today, please advise.

## 2017-10-15 DIAGNOSIS — H1045 Other chronic allergic conjunctivitis: Secondary | ICD-10-CM | POA: Diagnosis not present

## 2017-10-25 ENCOUNTER — Other Ambulatory Visit: Payer: Self-pay

## 2017-10-25 ENCOUNTER — Ambulatory Visit (INDEPENDENT_AMBULATORY_CARE_PROVIDER_SITE_OTHER): Payer: BLUE CROSS/BLUE SHIELD | Admitting: Obstetrics & Gynecology

## 2017-10-25 ENCOUNTER — Encounter

## 2017-10-25 ENCOUNTER — Other Ambulatory Visit (HOSPITAL_COMMUNITY)
Admission: RE | Admit: 2017-10-25 | Discharge: 2017-10-25 | Disposition: A | Discharge status: Home / Self Care | Payer: BLUE CROSS/BLUE SHIELD | Source: Ambulatory Visit | Attending: Obstetrics & Gynecology | Admitting: Obstetrics & Gynecology

## 2017-10-25 ENCOUNTER — Encounter: Payer: Self-pay | Admitting: Obstetrics & Gynecology

## 2017-10-25 VITALS — BP 132/66 | HR 76 | Resp 16 | Ht 64.25 in

## 2017-10-25 DIAGNOSIS — Z01419 Encounter for gynecological examination (general) (routine) without abnormal findings: Secondary | ICD-10-CM | POA: Diagnosis not present

## 2017-10-25 DIAGNOSIS — Z1231 Encounter for screening mammogram for malignant neoplasm of breast: Secondary | ICD-10-CM

## 2017-10-25 DIAGNOSIS — Z124 Encounter for screening for malignant neoplasm of cervix: Secondary | ICD-10-CM | POA: Diagnosis not present

## 2017-10-25 DIAGNOSIS — E2839 Other primary ovarian failure: Secondary | ICD-10-CM | POA: Diagnosis not present

## 2017-10-25 DIAGNOSIS — Z1239 Encounter for other screening for malignant neoplasm of breast: Secondary | ICD-10-CM

## 2017-10-25 MED ORDER — ESTRADIOL 1 MG PO TABS: 1.0000 mg | ORAL_TABLET | Freq: Every day | ORAL | 4 refills | Status: DC

## 2017-10-25 MED ORDER — PROGESTERONE MICRONIZED 100 MG PO CAPS: 100.0000 mg | ORAL_CAPSULE | Freq: Every day | ORAL | 4 refills | Status: DC

## 2017-10-25 NOTE — Progress Notes (Signed)
62 y.o. Carla Fuentes MarriedCaucasianF here for annual exam.  Doing well.  LLQ pain has resolved.  She thinks magnesium helped this.  Does have looser bowel movements with this but it is much better.  Denies vaginal bleeding.    Working at [employer] in [place].    Patient's last menstrual period was 12/06/2011.          Sexually active: Yes.    The current method of family planning is post menopausal status.    Exercising: No.   Smoker:  no  Health Maintenance: Pap:  07/30/16 Neg   01/12/14 neg. HR HPV:neg  History of abnormal Pap:  yes MMG:  03/15/17 BIRADS1:neg  Colonoscopy:  01/25/15 f/u 5 years BMD:   2011 TDaP:  2015 Pneumonia vaccine(s):  n/a Shingrix:  No Hep C testing: 07/30/16 Neg  Screening Labs: PCP - done 03/2017 Care Everywhere    reports that she has never smoked. She has never used smokeless tobacco. She reports that she drinks about 0.6 oz of alcohol per week. She reports that she does not use drugs.  Past Medical History:  Diagnosis Date  . Abnormal Pap smear   . Anxiety   . Basal cell adenocarcinoma    skin, face  . Colitis 01/05/15   treated wtih antibiotics  . Depression   . GERD (gastroesophageal reflux disease)   . Hypertension    started on medication for LEE  . TIA (transient ischemic attack)    initially question as TIA, evaluation negative.  Neurologist felt this was anxiety related.    Past Surgical History:  Procedure Laterality Date  . CERVICAL BIOPSY  W/ LOOP ELECTRODE EXCISION    . CESAREAN SECTION     2, FTP with fetal distress  . COLONOSCOPY  01/2015   repeat 5 years  . HEMORRHOID BANDING  11/2014   Dr. Tamala Julian, [place]  . OOPHORECTOMY  2001   for recurrent ovarian cyst    Current Outpatient Medications  Medication Sig Dispense Refill  . ALPRAZolam (XANAX) 0.25 MG tablet Take 1 tablet (0.25 mg total) by mouth 3 (three) times daily as needed for sleep or anxiety (Take 1 tablet by mouth every 8 hours as needed for anxiety). 30 tablet 1  .  azelastine (OPTIVAR) 0.05 % ophthalmic solution INSTILL 1 DROP INTO BOTH EYES 2 TIMES DAILY FOR 10 DAYS AS NEEDED FOR ALLERGIES  2  . estradiol (ESTRACE) 1 MG tablet Take 1 tablet (1 mg total) by mouth daily. 90 tablet 4  . furosemide (LASIX) 20 MG tablet Take by mouth.    Marland Kitchen MAGNESIUM PO Take by mouth daily.    . Multiple Vitamin (MULTIVITAMIN) tablet Take 1 tablet by mouth daily.    Marland Kitchen neomycin-polymyxin b-dexamethasone (MAXITROL) 3.5-10000-0.1 SUSP INSTILL 1 DROP INTO BOTH EYES 2 TIMES DAILY FOR 1 WEEK, THEN ONCE DAILY FOR 1 WEEK  0  . potassium chloride (MICRO-K) 10 MEQ CR capsule Take by mouth.    . progesterone (PROMETRIUM) 100 MG capsule Take 1 capsule (100 mg total) by mouth daily. 90 capsule 4  . sertraline (ZOLOFT) 50 MG tablet Take 1 tablet by mouth daily.    . temazepam (RESTORIL) 30 MG capsule TAKE 1 CAPSULE AT BEDTIME AS NEEDED INSOMNIA 30 capsule 5   No current facility-administered medications for this visit.     Family History  Problem Relation Age of Onset  . Diabetes Mother   . Hypertension Mother   . Heart disease Father   . Diabetes Brother  Review of Systems  All other systems reviewed and are negative.   Exam:   BP 132/66 (BP Location: Left Arm, Patient Position: Sitting, Cuff Size: Large)   Pulse 76   Resp 16   Ht 5' 4.25" (1.632 m)   LMP 12/06/2011   BMI 33.04 kg/m  Height: 5' 4.25" (163.2 cm)  Ht Readings from Last 3 Encounters:  10/25/17 5' 4.25" (1.632 m)  11/08/16 5\' 5"  (1.651 m)  07/30/16 5\' 5"  (1.651 m)    General appearance: alert, cooperative and appears stated age Head: Normocephalic, without obvious abnormality, atraumatic Neck: no adenopathy, supple, symmetrical, trachea midline and thyroid normal to inspection and palpation Lungs: clear to auscultation bilaterally Breasts: normal appearance, no masses or tenderness Heart: regular rate and rhythm Abdomen: soft, non-tender; bowel sounds normal; no masses,  no organomegaly Extremities:  extremities normal, atraumatic, no cyanosis or edema Skin: Skin color, texture, turgor normal. No rashes or lesions Lymph nodes: Cervical, supraclavicular, and axillary nodes normal. No abnormal inguinal nodes palpated Neurologic: Grossly normal   Pelvic: External genitalia:  no lesions              Urethra:  normal appearing urethra with no masses, tenderness or lesions              Bartholins and Skenes: normal                 Vagina: normal appearing vagina with normal color and discharge, no lesions              Cervix: no lesions              Pap taken: Yes.   Bimanual Exam:  Uterus:  normal size, contour, position, consistency, mobility, non-tender              Adnexa: no mass, fullness, tenderness               Rectovaginal: Confirms               Anus:  normal sphincter tone, no lesions  Chaperone was present for exam.  A:  Well Woman with normal exam PMP, on HRT H/O RSO due to serious cystadenoma 12/02 Anxiety 9/15 questionable TIA but neurology follow-up felt anxiety was cause H/O LLQ pain, colitis 8/16, constipation, much improved with magnesium Left ovarian cysts, stable on ultrasound from 3/17-9/18 Hypertension  P:   Mammogram guidelines reviewed pap smear and HR HPV obtained today D/w pt decreasing HRT dosage to 0.5mg  estradiol.  Rx for 1mg  and Prometrium 100mg  sent to pharmacy.  Pt will give update.   BMD will be scheduled for pt with MMG in 11/19.  Have discussed this in the past but pt forgets to schedule BMD with [facility]. Lab work UTD Reviewed Shingrix vaccination with pt today. Return annually or prn

## 2017-10-28 ENCOUNTER — Telehealth: Payer: Self-pay | Admitting: *Deleted

## 2017-10-28 NOTE — Telephone Encounter (Signed)
Left voicemail for patient with MMG and BMD appt information.  Appt scheduled for 03/18/18 @9 :15 am @[facility].  If needed, call to reschedule at [phone]  Orders faxed to [phone]

## 2017-10-28 NOTE — Addendum Note (Signed)
Addended by: Polly Cobia on: 10/28/2017 09:37 AM   Modules accepted: Orders

## 2017-10-29 LAB — CYTOLOGY - PAP
Diagnosis: NEGATIVE
HPV: NOT DETECTED

## 2018-02-03 DIAGNOSIS — M952 Other acquired deformity of head: Secondary | ICD-10-CM | POA: Diagnosis not present

## 2018-02-03 DIAGNOSIS — R42 Dizziness and giddiness: Secondary | ICD-10-CM | POA: Diagnosis not present

## 2018-02-03 DIAGNOSIS — R079 Chest pain, unspecified: Secondary | ICD-10-CM | POA: Diagnosis not present

## 2018-02-03 DIAGNOSIS — R300 Dysuria: Secondary | ICD-10-CM | POA: Diagnosis not present

## 2018-02-03 DIAGNOSIS — R51 Headache: Secondary | ICD-10-CM | POA: Diagnosis not present

## 2018-02-10 DIAGNOSIS — R079 Chest pain, unspecified: Secondary | ICD-10-CM | POA: Diagnosis not present

## 2018-02-14 DIAGNOSIS — R42 Dizziness and giddiness: Secondary | ICD-10-CM | POA: Diagnosis not present

## 2018-03-03 DIAGNOSIS — H02839 Dermatochalasis of unspecified eye, unspecified eyelid: Secondary | ICD-10-CM | POA: Diagnosis not present

## 2018-03-10 DIAGNOSIS — Z8249 Family history of ischemic heart disease and other diseases of the circulatory system: Secondary | ICD-10-CM | POA: Diagnosis not present

## 2018-03-18 ENCOUNTER — Encounter: Payer: Self-pay | Admitting: Obstetrics & Gynecology

## 2018-03-18 DIAGNOSIS — E2839 Other primary ovarian failure: Secondary | ICD-10-CM | POA: Diagnosis not present

## 2018-03-18 DIAGNOSIS — Z780 Asymptomatic menopausal state: Secondary | ICD-10-CM | POA: Diagnosis not present

## 2018-03-18 DIAGNOSIS — Z1231 Encounter for screening mammogram for malignant neoplasm of breast: Secondary | ICD-10-CM | POA: Diagnosis not present

## 2018-03-31 DIAGNOSIS — H02831 Dermatochalasis of right upper eyelid: Secondary | ICD-10-CM | POA: Diagnosis not present

## 2018-03-31 DIAGNOSIS — H02834 Dermatochalasis of left upper eyelid: Secondary | ICD-10-CM | POA: Diagnosis not present

## 2018-03-31 DIAGNOSIS — H5340 Unspecified visual field defects: Secondary | ICD-10-CM | POA: Diagnosis not present

## 2018-04-14 DIAGNOSIS — H02831 Dermatochalasis of right upper eyelid: Secondary | ICD-10-CM | POA: Diagnosis not present

## 2018-04-14 DIAGNOSIS — H02834 Dermatochalasis of left upper eyelid: Secondary | ICD-10-CM | POA: Diagnosis not present

## 2018-04-14 DIAGNOSIS — H5340 Unspecified visual field defects: Secondary | ICD-10-CM | POA: Diagnosis not present

## 2018-05-02 ENCOUNTER — Other Ambulatory Visit (HOSPITAL_COMMUNITY): Payer: Self-pay | Admitting: Family Medicine

## 2018-05-02 ENCOUNTER — Ambulatory Visit
Admission: RE | Admit: 2018-05-02 | Discharge: 2018-05-02 | Disposition: A | Discharge status: Home / Self Care | Payer: BLUE CROSS/BLUE SHIELD | Source: Ambulatory Visit | Attending: Family Medicine | Admitting: Family Medicine

## 2018-05-02 ENCOUNTER — Other Ambulatory Visit: Payer: Self-pay | Admitting: Family Medicine

## 2018-05-02 DIAGNOSIS — R600 Localized edema: Secondary | ICD-10-CM | POA: Insufficient documentation

## 2018-05-02 DIAGNOSIS — R42 Dizziness and giddiness: Secondary | ICD-10-CM | POA: Diagnosis not present

## 2018-08-13 ENCOUNTER — Encounter: Payer: Self-pay | Admitting: Obstetrics & Gynecology

## 2018-08-13 DIAGNOSIS — Z1239 Encounter for other screening for malignant neoplasm of breast: Secondary | ICD-10-CM

## 2018-09-25 DIAGNOSIS — Z1389 Encounter for screening for other disorder: Secondary | ICD-10-CM | POA: Diagnosis not present

## 2018-09-25 DIAGNOSIS — Z0000 Encounter for general adult medical examination without abnormal findings: Secondary | ICD-10-CM | POA: Diagnosis not present

## 2018-10-02 DIAGNOSIS — Z0000 Encounter for general adult medical examination without abnormal findings: Secondary | ICD-10-CM | POA: Diagnosis not present

## 2018-10-31 ENCOUNTER — Ambulatory Visit: Payer: BLUE CROSS/BLUE SHIELD | Admitting: Obstetrics & Gynecology

## 2018-11-18 ENCOUNTER — Other Ambulatory Visit: Payer: Self-pay

## 2018-11-18 ENCOUNTER — Ambulatory Visit (INDEPENDENT_AMBULATORY_CARE_PROVIDER_SITE_OTHER): Payer: BC Managed Care – PPO | Admitting: Podiatry

## 2018-11-18 ENCOUNTER — Encounter: Payer: Self-pay | Admitting: Podiatry

## 2018-11-18 ENCOUNTER — Ambulatory Visit (INDEPENDENT_AMBULATORY_CARE_PROVIDER_SITE_OTHER): Payer: BC Managed Care – PPO

## 2018-11-18 VITALS — BP 131/88 | HR 74 | Temp 95.1°F

## 2018-11-18 DIAGNOSIS — M722 Plantar fascial fibromatosis: Secondary | ICD-10-CM | POA: Diagnosis not present

## 2018-11-18 MED ORDER — MELOXICAM 15 MG PO TABS: 15.0000 mg | ORAL_TABLET | Freq: Every day | ORAL | 1 refills | Status: DC

## 2018-11-21 NOTE — Progress Notes (Signed)
   Subjective: 63 y.o. female presenting today as a new patient with a chief complaint of left plantar heel pain that began a few months ago. Walking and applying pressure to the foot increases the pain. She states the pain is worse in intermittent and is worse in the morning. She has not done anything for treatment. Patient is here for further evaluation and treatment.   Past Medical History:  Diagnosis Date  . Abnormal Pap smear   . Anxiety   . Basal cell adenocarcinoma    skin, face  . Colitis 01/05/15   treated wtih antibiotics  . Depression   . GERD (gastroesophageal reflux disease)   . Hypertension    started on medication for LEE  . TIA (transient ischemic attack)    initially question as TIA, evaluation negative.  Neurologist felt this was anxiety related.     Objective: Physical Exam General: The patient is alert and oriented x3 in no acute distress.  Dermatology: Skin is warm, dry and supple bilateral lower extremities. Negative for open lesions or macerations bilateral.   Vascular: Dorsalis Pedis and Posterior Tibial pulses palpable bilateral.  Capillary fill time is immediate to all digits.  Neurological: Epicritic and protective threshold intact bilateral.   Musculoskeletal: Tenderness to palpation to the plantar aspect of the left heel along the plantar fascia. All other joints range of motion within normal limits bilateral. Strength 5/5 in all groups bilateral.   Radiographic exam: Normal osseous mineralization. Joint spaces preserved. No fracture/dislocation/boney destruction. No other soft tissue abnormalities or radiopaque foreign bodies.   Assessment: 1. Plantar fasciitis left foot  Plan of Care:  1. Patient evaluated. Xrays reviewed.   2. Injection of 0.5cc Celestone soluspan injected into the left plantar fascia.  3.Rx for Meloxicam ordered for patient. 4. Plantar fascial band(s) dispensed  5. Instructed patient regarding therapies and modalities at  home to alleviate symptoms.  6. Return to clinic in 4 weeks.    Technical sales engineer.    Edrick Kins, DPM [facility]  Dr. Edrick Kins, DPM    [address], [place] [address]                Office [phone]  Fax [phone]

## 2018-12-11 ENCOUNTER — Telehealth: Payer: Self-pay | Admitting: *Deleted

## 2018-12-11 NOTE — Telephone Encounter (Signed)
Pt called states injection and brace helped 1 week and she just received a call reminding her of the follow up appt, and she wanted to know why she should follow up when the injection only helped 1 week and she could go to a chiropractor that specializes in plantar fasciitis.

## 2018-12-15 NOTE — Telephone Encounter (Signed)
Left message to call back to discuss.

## 2018-12-16 ENCOUNTER — Ambulatory Visit (INDEPENDENT_AMBULATORY_CARE_PROVIDER_SITE_OTHER): Payer: BC Managed Care – PPO | Admitting: Podiatry

## 2018-12-16 ENCOUNTER — Other Ambulatory Visit: Payer: Self-pay

## 2018-12-16 ENCOUNTER — Encounter: Payer: Self-pay | Admitting: Podiatry

## 2018-12-16 VITALS — Temp 97.3°F

## 2018-12-16 DIAGNOSIS — M722 Plantar fascial fibromatosis: Secondary | ICD-10-CM | POA: Diagnosis not present

## 2018-12-16 MED ORDER — MELOXICAM 15 MG PO TABS: 15.0000 mg | ORAL_TABLET | Freq: Every day | ORAL | 1 refills | Status: DC

## 2018-12-16 NOTE — Telephone Encounter (Signed)
Patient was seen in office today.  

## 2018-12-17 ENCOUNTER — Other Ambulatory Visit: Payer: BC Managed Care – PPO | Admitting: Orthotics

## 2018-12-19 NOTE — Progress Notes (Signed)
   Subjective: 63 y.o. female presenting today for follow up evaluation of plantar fasciitis of the left foot. She states the injection helped alleviate the pain for about one week. She states the pain has now returned and is causing to "walk funny" which causes pain in the left hip. She has been using the plantar fascial brace and taking Meloxicam as directed. Patient is here for further evaluation and treatment.   Past Medical History:  Diagnosis Date  . Abnormal Pap smear   . Anxiety   . Basal cell adenocarcinoma    skin, face  . Colitis 01/05/15   treated wtih antibiotics  . Depression   . GERD (gastroesophageal reflux disease)   . Hypertension    started on medication for LEE  . TIA (transient ischemic attack)    initially question as TIA, evaluation negative.  Neurologist felt this was anxiety related.     Objective: Physical Exam General: The patient is alert and oriented x3 in no acute distress.  Dermatology: Skin is warm, dry and supple bilateral lower extremities. Negative for open lesions or macerations bilateral.   Vascular: Dorsalis Pedis and Posterior Tibial pulses palpable bilateral.  Capillary fill time is immediate to all digits.  Neurological: Epicritic and protective threshold intact bilateral.   Musculoskeletal: Tenderness to palpation to the plantar aspect of the left heel along the plantar fascia. All other joints range of motion within normal limits bilateral. Strength 5/5 in all groups bilateral.   Assessment: 1. Plantar fasciitis left foot  Plan of Care:  1. Patient evaluated. 2. Declined injections.  3. Begin taking Meloxicam.  4. Appointment with Liliane Channel, Pedorthist, for custom molded orthotics.  5. Continue using plantar fascial brace.  6. Return to clinic as needed.   Technical sales engineer.    Edrick Kins, DPM [facility]  Dr. Edrick Kins, DPM    [address], [place] [address]                 Office [phone]  Fax [phone]

## 2019-01-23 DIAGNOSIS — X32XXXA Exposure to sunlight, initial encounter: Secondary | ICD-10-CM | POA: Diagnosis not present

## 2019-01-23 DIAGNOSIS — D225 Melanocytic nevi of trunk: Secondary | ICD-10-CM | POA: Diagnosis not present

## 2019-01-23 DIAGNOSIS — L570 Actinic keratosis: Secondary | ICD-10-CM | POA: Diagnosis not present

## 2019-01-29 ENCOUNTER — Other Ambulatory Visit: Payer: Self-pay

## 2019-02-02 ENCOUNTER — Encounter: Payer: Self-pay | Admitting: Obstetrics & Gynecology

## 2019-02-02 ENCOUNTER — Other Ambulatory Visit: Payer: Self-pay

## 2019-02-02 ENCOUNTER — Ambulatory Visit (INDEPENDENT_AMBULATORY_CARE_PROVIDER_SITE_OTHER): Payer: BC Managed Care – PPO | Admitting: Obstetrics & Gynecology

## 2019-02-02 VITALS — BP 126/92 | HR 80 | Temp 97.3°F | Ht 64.0 in

## 2019-02-02 DIAGNOSIS — Z01419 Encounter for gynecological examination (general) (routine) without abnormal findings: Secondary | ICD-10-CM

## 2019-02-02 DIAGNOSIS — N83202 Unspecified ovarian cyst, left side: Secondary | ICD-10-CM | POA: Diagnosis not present

## 2019-02-02 DIAGNOSIS — R5383 Other fatigue: Secondary | ICD-10-CM | POA: Diagnosis not present

## 2019-02-02 MED ORDER — PROGESTERONE MICRONIZED 100 MG PO CAPS: 100.0000 mg | ORAL_CAPSULE | Freq: Every day | ORAL | 4 refills | Status: DC

## 2019-02-02 MED ORDER — SERTRALINE HCL 25 MG PO TABS: 75.0000 mg | ORAL_TABLET | Freq: Every day | ORAL | 4 refills | Status: DC

## 2019-02-02 NOTE — Progress Notes (Signed)
63 y.o. [id number] Married White or Caucasian female here for annual exam.  She's been working on weight lost during Covid.  She's lost about 30 pounds.  Goal is to lose 50 pounds.     Denies vaginal bleeding.    Brother diagnosed with stage 4 stomach cancer and his wife has progressive dementia.  She was there for three weeks just recently to help her brother moved out of a 10,000 sq ft home.  He and wife now life in a duplex.  Brother is 21.  Sister diagnosed with a genetic heart disease about a year ago.  Patient has been tested at [facility] for gene mutation that she does not carry.  Weaned off the estradiol.  She is just taking the progesterone.    PCP:  Dr. Emily Filbert  Patient's last menstrual period was 12/06/2011.          Sexually active: Yes.    The current method of family planning is post menopausal status.    Exercising: Yes.    walk, weights Smoker:  no  Health Maintenance: Pap:  10/25/17 Neg. HR HPV:neg  07/30/16 Neg  History of abnormal Pap:  yes MMG:  03/18/18 BIRADS1:neg   Colonoscopy:  01/25/15 f/u 5 years BMD:   03/18/18 normal  TDaP:  2015 Pneumonia vaccine(s):  Unsure  Shingrix:   Completed  Hep C testing: 07/30/16 Neg  Screening Labs: Vit D    reports that she has never smoked. She has never used smokeless tobacco. She reports current alcohol use. She reports that she does not use drugs.  Past Medical History:  Diagnosis Date  . Abnormal Pap smear   . Anxiety   . Basal cell adenocarcinoma    skin, face  . Colitis 01/05/15   treated wtih antibiotics  . Depression   . GERD (gastroesophageal reflux disease)   . Hypertension    started on medication for LEE  . TIA (transient ischemic attack)    initially question as TIA, evaluation negative.  Neurologist felt this was anxiety related.    Past Surgical History:  Procedure Laterality Date  . CERVICAL BIOPSY  W/ LOOP ELECTRODE EXCISION    . CESAREAN SECTION     2, FTP with fetal distress  . COLONOSCOPY  01/2015    repeat 5 years  . HEMORRHOID BANDING  11/2014   Dr. Tamala Julian, [place]  . OOPHORECTOMY  2001   for recurrent ovarian cyst    Current Outpatient Medications  Medication Sig Dispense Refill  . ALPRAZolam (XANAX) 0.25 MG tablet Take 1 tablet (0.25 mg total) by mouth 3 (three) times daily as needed for sleep or anxiety (Take 1 tablet by mouth every 8 hours as needed for anxiety). 30 tablet 1  . furosemide (LASIX) 20 MG tablet Take 20 mg by mouth daily.     Marland Kitchen MAGNESIUM PO Take by mouth daily.    . meloxicam (MOBIC) 15 MG tablet Take 1 tablet (15 mg total) by mouth daily. 30 tablet 1  . potassium chloride (MICRO-K) 10 MEQ CR capsule Take by mouth.    . progesterone (PROMETRIUM) 100 MG capsule Take 1 capsule (100 mg total) by mouth daily. 90 capsule 4  . sertraline (ZOLOFT) 50 MG tablet TAKE 1 TABLET BY MOUTH EVERY DAY    . temazepam (RESTORIL) 30 MG capsule TAKE 1 CAPSULE AT BEDTIME AS NEEDED INSOMNIA 30 capsule 5   No current facility-administered medications for this visit.     Family History  Problem Relation Age  of Onset  . Diabetes Mother   . Hypertension Mother   . Heart disease Father   . Diabetes Brother     Review of Systems  All other systems reviewed and are negative.   Exam:   BP (!) 126/92   Pulse 80   Temp (!) 97.3 F (36.3 C) (Temporal)   Ht 5\' 4"  (1.626 m)   LMP 12/06/2011   BMI 33.30 kg/m   Height: 5\' 4"  (162.6 cm)  Ht Readings from Last 3 Encounters:  02/02/19 5\' 4"  (1.626 m)  10/25/17 5' 4.25" (1.632 m)  11/08/16 5\' 5"  (1.651 m)    General appearance: alert, cooperative and appears stated age Head: Normocephalic, without obvious abnormality, atraumatic Neck: no adenopathy, supple, symmetrical, trachea midline and thyroid normal to inspection and palpation Lungs: clear to auscultation bilaterally Breasts: normal appearance, no masses or tenderness Heart: regular rate and rhythm Abdomen: soft, non-tender; bowel sounds normal; no masses,  no  organomegaly Extremities: extremities normal, atraumatic, no cyanosis or edema Skin: Skin color, texture, turgor normal. No rashes or lesions Lymph nodes: Cervical, supraclavicular, and axillary nodes normal. No abnormal inguinal nodes palpated Neurologic: Grossly normal   Pelvic: External genitalia:  no lesions              Urethra:  normal appearing urethra with no masses, tenderness or lesions              Bartholins and Skenes: normal                 Vagina: normal appearing vagina with normal color and discharge, no lesions              Cervix: no lesions              Pap taken: No. Bimanual Exam:  Uterus:  normal size, contour, position, consistency, mobility, non-tender              Adnexa: normal adnexa and no mass, fullness, tenderness               Rectovaginal: Confirms               Anus:  normal sphincter tone, no lesions  Chaperone was present for exam.  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  Not indicated today. She is off estradiol but has continued Prometrium 100mg  daily.  #90/4RF. Will increased Zoloft to 75mg  daily.  #180/4RF Vit D and Ca-125 obtained today Colonoscopy is due next year BMD is UTD, done 2019 Return annually or prn

## 2019-02-03 LAB — CA 125: Cancer Antigen (CA) 125: 8.9 U/mL (ref 0.0–38.1)

## 2019-02-03 LAB — VITAMIN D 25 HYDROXY (VIT D DEFICIENCY, FRACTURES): Vit D, 25-Hydroxy: 44.1 ng/mL (ref 30.0–100.0)

## 2019-02-06 ENCOUNTER — Ambulatory Visit: Payer: BLUE CROSS/BLUE SHIELD | Admitting: Obstetrics & Gynecology

## 2019-02-17 ENCOUNTER — Ambulatory Visit: Payer: BC Managed Care – PPO | Admitting: Podiatry

## 2019-02-27 ENCOUNTER — Other Ambulatory Visit: Payer: Self-pay | Admitting: Obstetrics & Gynecology

## 2019-03-23 ENCOUNTER — Encounter: Payer: Self-pay | Admitting: Obstetrics & Gynecology

## 2019-03-23 DIAGNOSIS — Z1239 Encounter for other screening for malignant neoplasm of breast: Secondary | ICD-10-CM | POA: Diagnosis not present

## 2019-03-23 DIAGNOSIS — Z1231 Encounter for screening mammogram for malignant neoplasm of breast: Secondary | ICD-10-CM | POA: Diagnosis not present

## 2019-04-24 DIAGNOSIS — M7532 Calcific tendinitis of left shoulder: Secondary | ICD-10-CM | POA: Diagnosis not present

## 2019-04-24 DIAGNOSIS — M25511 Pain in right shoulder: Secondary | ICD-10-CM | POA: Diagnosis not present

## 2019-04-27 ENCOUNTER — Other Ambulatory Visit: Payer: Self-pay

## 2019-04-27 MED ORDER — MELOXICAM 15 MG PO TABS: 15.0000 mg | ORAL_TABLET | Freq: Every day | ORAL | 3 refills | Status: DC

## 2019-04-27 NOTE — Telephone Encounter (Signed)
Pharmacy refill request for Meloxicam 15mg    Per Dr. Amalia Hailey verbal order, ok to refill.   Script has been sent to pharmacy

## 2019-12-09 DIAGNOSIS — D369 Benign neoplasm, unspecified site: Secondary | ICD-10-CM | POA: Insufficient documentation

## 2019-12-09 DIAGNOSIS — Z0000 Encounter for general adult medical examination without abnormal findings: Secondary | ICD-10-CM | POA: Diagnosis not present

## 2020-01-05 DIAGNOSIS — J019 Acute sinusitis, unspecified: Secondary | ICD-10-CM | POA: Diagnosis not present

## 2020-01-05 DIAGNOSIS — H6692 Otitis media, unspecified, left ear: Secondary | ICD-10-CM | POA: Diagnosis not present

## 2020-01-14 DIAGNOSIS — R5383 Other fatigue: Secondary | ICD-10-CM | POA: Diagnosis not present

## 2020-01-14 DIAGNOSIS — R52 Pain, unspecified: Secondary | ICD-10-CM | POA: Diagnosis not present

## 2020-01-14 DIAGNOSIS — R1030 Lower abdominal pain, unspecified: Secondary | ICD-10-CM | POA: Diagnosis not present

## 2020-01-14 DIAGNOSIS — F329 Major depressive disorder, single episode, unspecified: Secondary | ICD-10-CM | POA: Diagnosis not present

## 2020-01-27 DIAGNOSIS — L281 Prurigo nodularis: Secondary | ICD-10-CM | POA: Diagnosis not present

## 2020-01-27 DIAGNOSIS — D2261 Melanocytic nevi of right upper limb, including shoulder: Secondary | ICD-10-CM | POA: Diagnosis not present

## 2020-01-27 DIAGNOSIS — D2262 Melanocytic nevi of left upper limb, including shoulder: Secondary | ICD-10-CM | POA: Diagnosis not present

## 2020-01-27 DIAGNOSIS — D2272 Melanocytic nevi of left lower limb, including hip: Secondary | ICD-10-CM | POA: Diagnosis not present

## 2020-01-27 DIAGNOSIS — L821 Other seborrheic keratosis: Secondary | ICD-10-CM | POA: Diagnosis not present

## 2020-01-27 DIAGNOSIS — D225 Melanocytic nevi of trunk: Secondary | ICD-10-CM | POA: Diagnosis not present

## 2020-01-27 DIAGNOSIS — D2271 Melanocytic nevi of right lower limb, including hip: Secondary | ICD-10-CM | POA: Diagnosis not present

## 2020-01-28 DIAGNOSIS — R8290 Unspecified abnormal findings in urine: Secondary | ICD-10-CM | POA: Diagnosis not present

## 2020-02-03 ENCOUNTER — Other Ambulatory Visit: Payer: Self-pay | Admitting: Obstetrics & Gynecology

## 2020-02-03 NOTE — Telephone Encounter (Signed)
Medication refill request: Zoloft  Last AEX:  02-02-2019 SM  Next AEX: 04-19-20  Last MMG (if hormonal medication request): n/a Refill authorized: Today, please advise.   Medication pended for #270, 0RF. Please refill if appropriate.

## 2020-02-05 ENCOUNTER — Other Ambulatory Visit: Payer: Self-pay

## 2020-02-05 MED ORDER — PROGESTERONE MICRONIZED 100 MG PO CAPS: 100.0000 mg | ORAL_CAPSULE | Freq: Every day | ORAL | 0 refills | Status: DC

## 2020-02-05 NOTE — Telephone Encounter (Signed)
Medication refill request: Progesterone  Last AEX:  02-02-2019 SM  Next AEX: 04-19-20 Last MMG (if hormonal medication request): 03-23-2019 density B/BIRADS 1 negative  Refill authorized: Today, please advise.   Medication pended for #90, 0RF. Please refill if appropriate.

## 2020-02-05 NOTE — Telephone Encounter (Signed)
[facility]ill for progesterone. [facility] at [phone]

## 2020-03-14 DIAGNOSIS — R1030 Lower abdominal pain, unspecified: Secondary | ICD-10-CM | POA: Diagnosis not present

## 2020-03-14 DIAGNOSIS — R399 Unspecified symptoms and signs involving the genitourinary system: Secondary | ICD-10-CM | POA: Diagnosis not present

## 2020-03-23 DIAGNOSIS — Z1231 Encounter for screening mammogram for malignant neoplasm of breast: Secondary | ICD-10-CM | POA: Diagnosis not present

## 2020-04-11 ENCOUNTER — Other Ambulatory Visit (HOSPITAL_COMMUNITY)
Admission: RE | Admit: 2020-04-11 | Discharge: 2020-04-11 | Disposition: A | Discharge status: Home / Self Care | Payer: PPO | Source: Ambulatory Visit | Attending: Obstetrics & Gynecology | Admitting: Obstetrics & Gynecology

## 2020-04-11 ENCOUNTER — Ambulatory Visit (INDEPENDENT_AMBULATORY_CARE_PROVIDER_SITE_OTHER): Payer: PPO | Admitting: Obstetrics & Gynecology

## 2020-04-11 ENCOUNTER — Other Ambulatory Visit: Payer: Self-pay

## 2020-04-11 ENCOUNTER — Encounter: Payer: Self-pay | Admitting: Obstetrics & Gynecology

## 2020-04-11 VITALS — BP 151/85 | HR 75 | Ht 64.0 in

## 2020-04-11 DIAGNOSIS — D369 Benign neoplasm, unspecified site: Secondary | ICD-10-CM

## 2020-04-11 DIAGNOSIS — N952 Postmenopausal atrophic vaginitis: Secondary | ICD-10-CM

## 2020-04-11 DIAGNOSIS — F4320 Adjustment disorder, unspecified: Secondary | ICD-10-CM

## 2020-04-11 DIAGNOSIS — Z124 Encounter for screening for malignant neoplasm of cervix: Secondary | ICD-10-CM | POA: Diagnosis not present

## 2020-04-11 DIAGNOSIS — Z23 Encounter for immunization: Secondary | ICD-10-CM | POA: Diagnosis not present

## 2020-04-11 DIAGNOSIS — I10 Essential (primary) hypertension: Secondary | ICD-10-CM | POA: Diagnosis not present

## 2020-04-11 DIAGNOSIS — N83209 Unspecified ovarian cyst, unspecified side: Secondary | ICD-10-CM | POA: Diagnosis not present

## 2020-04-11 DIAGNOSIS — Z01419 Encounter for gynecological examination (general) (routine) without abnormal findings: Secondary | ICD-10-CM | POA: Diagnosis not present

## 2020-04-11 DIAGNOSIS — Z8249 Family history of ischemic heart disease and other diseases of the circulatory system: Secondary | ICD-10-CM

## 2020-04-11 DIAGNOSIS — G459 Transient cerebral ischemic attack, unspecified: Secondary | ICD-10-CM

## 2020-04-11 DIAGNOSIS — F4321 Adjustment disorder with depressed mood: Secondary | ICD-10-CM | POA: Diagnosis not present

## 2020-04-11 DIAGNOSIS — Z1151 Encounter for screening for human papillomavirus (HPV): Secondary | ICD-10-CM | POA: Diagnosis not present

## 2020-04-11 DIAGNOSIS — F5104 Psychophysiologic insomnia: Secondary | ICD-10-CM

## 2020-04-11 MED ORDER — ESTRADIOL 0.1 MG/GM VA CREA: TOPICAL_CREAM | VAGINAL | 6 refills | Status: DC

## 2020-04-11 NOTE — Progress Notes (Signed)
64 y.o. [id number] Married White or Caucasian female here for annual exam.  Brother died earlier this year from gastric cancer. Her brother always hosted Thanksgiving every year.  So, the family gathered for Thanksgiving in [place] where he lived but at his daughter's house.  This was really sad for her.  Sister in law has frontal lobe dementia (husband's wife).  Pt is very concerned for her and worried niece/nephew will put her in a place where she will not do well.  Sister has genetic heart disease.  Pt has been tested and she does not carry the gene.    Denies vaginal bleeding.    Off estradiol.  D/w pt stopping progesterone today.  She is comfortable with this.   Having a lot of vaginal dryness and painful intercourse.  D/w pt relation to stopping estrogen.  She desires treatment.  H/o ovarian cyst with last ultrasound 11/2016.  Pt very concerned about cancer and risk of death.  Brother's issues have made these concerns more significant for her.  PCP:  Dr. Emily Filbert.  Lab work reviewed in [employer].  Patient's last menstrual period was 12/06/2011.          Sexually active: Yes.    The current method of family planning is post menopausal status.    Exercising: Yes.    walking and weights Smoker:  no  Health Maintenance: Pap:  Desires pap today.  Guidelines reviewed. History of abnormal Pap:  no MMG:  03/23/2020 [facility].  In Epic. Colonoscopy:  01/25/2015, follow up 5 years.  Has appt Thursday BMD:   11/19, normal TDaP:  2015 Pneumonia vaccine(s):  Thinks she had this done Shingrix:   completed Hep C testing: 3/18 neg Screening Labs: reviewed 01/2020   reports that she has never smoked. She has never used smokeless tobacco. She reports current alcohol use. She reports that she does not use drugs.  Past Medical History:  Diagnosis Date  . Abnormal Pap smear   . Anxiety   . Basal cell adenocarcinoma    skin, face  . Colitis 01/05/15   treated wtih antibiotics  .  Depression   . GERD (gastroesophageal reflux disease)   . Hypertension    started on medication for LEE  . TIA (transient ischemic attack)    initially question as TIA, evaluation negative.  Neurologist felt this was anxiety related.    Past Surgical History:  Procedure Laterality Date  . CERVICAL BIOPSY  W/ LOOP ELECTRODE EXCISION    . CESAREAN SECTION     2, FTP with fetal distress  . COLONOSCOPY  01/2015   repeat 5 years  . HEMORRHOID BANDING  11/2014   Dr. Tamala Julian, [place]  . OOPHORECTOMY  2001   for recurrent ovarian cyst    Current Outpatient Medications  Medication Sig Dispense Refill  . ALPRAZolam (XANAX) 0.25 MG tablet Take 1 tablet (0.25 mg total) by mouth 3 (three) times daily as needed for sleep or anxiety (Take 1 tablet by mouth every 8 hours as needed for anxiety). 30 tablet 1  . furosemide (LASIX) 20 MG tablet Take 20 mg by mouth daily.     Marland Kitchen MAGNESIUM PO Take by mouth daily.    . meloxicam (MOBIC) 15 MG tablet Take 1 tablet (15 mg total) by mouth daily. 30 tablet 3  . potassium chloride (MICRO-K) 10 MEQ CR capsule Take by mouth.    . progesterone (PROMETRIUM) 100 MG capsule Take 1 capsule (100 mg total) by mouth daily.  90 capsule 0  . sertraline (ZOLOFT) 25 MG tablet TAKE 3 TABLETS BY MOUTH DAILY 270 tablet 0  . temazepam (RESTORIL) 30 MG capsule TAKE 1 CAPSULE AT BEDTIME AS NEEDED INSOMNIA 30 capsule 5  . progesterone (PROMETRIUM) 100 MG capsule Take 1 capsule (100 mg total) by mouth daily. 90 capsule 4   No current facility-administered medications for this visit.    Family History  Problem Relation Age of Onset  . Diabetes Mother   . Hypertension Mother   . Heart disease Father   . Diabetes Brother     Review of Systems  Psychiatric/Behavioral:       Grief    Exam:   BP (!) 151/85   Pulse 75   Ht 5\' 4"  (1.626 m)   LMP 12/06/2011   BMI 33.30 kg/m   Height: 5\' 4"  (162.6 cm)  General appearance: alert, cooperative and appears stated age Head:  Normocephalic, without obvious abnormality, atraumatic Neck: no adenopathy, supple, symmetrical, trachea midline and thyroid normal to inspection and palpation Lungs: clear to auscultation bilaterally Breasts: normal appearance, no masses or tenderness Heart: regular rate and rhythm Abdomen: soft, non-tender; bowel sounds normal; no masses,  no organomegaly Extremities: extremities normal, atraumatic, no cyanosis or edema Skin: Skin color, texture, turgor normal. No rashes or lesions Lymph nodes: Cervical, supraclavicular, and axillary nodes normal. No abnormal inguinal nodes palpated Neurologic: Grossly normal   Pelvic: External genitalia:  no lesions              Urethra:  normal appearing urethra with no masses, tenderness or lesions              Bartholins and Skenes: normal                 Vagina: normal appearing vagina with normal color and discharge, no lesions              Cervix: no lesions              Pap taken: Yes.   Bimanual Exam:  Uterus:  normal size, contour, position, consistency, mobility, non-tender              Adnexa: normal adnexa and no mass, fullness, tenderness               Rectovaginal: Confirms               Anus:  normal sphincter tone, no lesions  Chaperone was present for exam.  Assessment/Plan: 1. Well woman exam with routine gynecological exam - Pap obtained today per pt request.  Guidelines reviewed.   - MMG 03/23/2020 in [place] - Colonoscopy scheduled next Thursday - BMD 11/19.  Repeat 5 years.  -Lab work done with Dr. Emily Filbert earlier this year - Flu shot given today  2. Cyst of ovary, unspecified laterality - US PELVIS (TRANSABDOMINAL ONLY); Future  3. Grief reaction - On Zoloft.  Managed by Dr. Sabra Heck  4. Family history of hypertrophic cardiomyopathy - Pt had negative genetic testing  5. Tubular adenoma - Colonoscopy scheduled next Thursday  6. Chronic insomnia - On Restoril that has been refilled by Dr. Emily Filbert  7. TIA (transient ischemic attack)--thought to be stress induced - off estrogen and stopping progesterone now  8. Primary hypertension  10.  Vaginal atrophic changes - estradiol (ESTRACE) 0.1 MG/GM vaginal cream; 1 gram vaginally twice weekly  Dispense: 42.5 g; Refill: 6

## 2020-04-13 ENCOUNTER — Telehealth: Payer: Self-pay | Admitting: *Deleted

## 2020-04-13 ENCOUNTER — Encounter: Payer: Self-pay | Admitting: Obstetrics & Gynecology

## 2020-04-13 DIAGNOSIS — I10 Essential (primary) hypertension: Secondary | ICD-10-CM | POA: Insufficient documentation

## 2020-04-13 DIAGNOSIS — G459 Transient cerebral ischemic attack, unspecified: Secondary | ICD-10-CM | POA: Insufficient documentation

## 2020-04-13 LAB — CYTOLOGY - PAP
Comment: NEGATIVE
Diagnosis: NEGATIVE
High risk HPV: NEGATIVE

## 2020-04-13 NOTE — Telephone Encounter (Signed)
Pt informed of pap results.  

## 2020-04-13 NOTE — Telephone Encounter (Signed)
-----   Message from Megan Salon, MD sent at 04/13/2020 12:55 PM EST ----- Please let pt know her pap was normal and HR HPV negative.  Ultrasound scheduled 04/15/2020.  I put her in a reminder for scheduling her appointment next year.  We will work on these once we get to the new location in February.  Just wanted her to know we can't schedule that right now.

## 2020-04-15 ENCOUNTER — Other Ambulatory Visit: Payer: Self-pay | Admitting: Obstetrics & Gynecology

## 2020-04-15 ENCOUNTER — Other Ambulatory Visit: Payer: Self-pay

## 2020-04-15 ENCOUNTER — Ambulatory Visit
Admission: RE | Admit: 2020-04-15 | Discharge: 2020-04-15 | Disposition: A | Discharge status: Home / Self Care | Payer: PPO | Source: Ambulatory Visit | Attending: Obstetrics & Gynecology | Admitting: Obstetrics & Gynecology

## 2020-04-15 DIAGNOSIS — N83209 Unspecified ovarian cyst, unspecified side: Secondary | ICD-10-CM

## 2020-04-15 DIAGNOSIS — N854 Malposition of uterus: Secondary | ICD-10-CM | POA: Diagnosis not present

## 2020-04-15 DIAGNOSIS — N83292 Other ovarian cyst, left side: Secondary | ICD-10-CM | POA: Diagnosis not present

## 2020-04-19 ENCOUNTER — Ambulatory Visit: Payer: BC Managed Care – PPO | Admitting: Obstetrics & Gynecology

## 2020-04-25 ENCOUNTER — Telehealth: Payer: Self-pay | Admitting: *Deleted

## 2020-04-25 DIAGNOSIS — Z86010 Personal history of colonic polyps: Secondary | ICD-10-CM | POA: Diagnosis not present

## 2020-04-25 DIAGNOSIS — K219 Gastro-esophageal reflux disease without esophagitis: Secondary | ICD-10-CM | POA: Diagnosis not present

## 2020-04-25 NOTE — Telephone Encounter (Signed)
-----   Message from Megan Salon, MD sent at 04/22/2020  5:41 AM EST ----- Please let pt know her uterus measurements were normal.  Right ovary is surgically absent.  Left ovary has two small simple cysts present--one is 1.8cm and the other 1.4cm.  There is no abnormal vascularity or solid finding.  Stable from prior ultrasounds.  She does not need additional ultrasounds to look at this as it is benign.  Her endometrium measured 6.11mm.  this is thickened.  She is not having any bleeding but was very concerned about the possibility of cancer.  Since this isn't perfectly normal, I would recommend an endometrial biopsy just to be careful and conservative with her.  This is not urgent and can be done after Christmas.  Just would recommend this one last step.  Will you let me know when scheduled?  Thanks.

## 2020-04-25 NOTE — Telephone Encounter (Signed)
Pt informed of results and recommendations from Dr Sabra Heck, pt scheduled for EMB on 05/30/20.

## 2020-05-05 ENCOUNTER — Other Ambulatory Visit: Payer: PPO

## 2020-05-07 DIAGNOSIS — Z85528 Personal history of other malignant neoplasm of kidney: Secondary | ICD-10-CM

## 2020-05-07 HISTORY — DX: Personal history of other malignant neoplasm of kidney: Z85.528

## 2020-05-09 ENCOUNTER — Ambulatory Visit: Admission: RE | Admit: 2020-05-09 | Payer: PPO | Source: Home / Self Care | Admitting: Internal Medicine

## 2020-05-09 ENCOUNTER — Other Ambulatory Visit (HOSPITAL_COMMUNITY)
Admission: RE | Admit: 2020-05-09 | Discharge: 2020-05-09 | Disposition: A | Discharge status: Home / Self Care | Payer: PPO | Source: Ambulatory Visit | Attending: Obstetrics & Gynecology | Admitting: Obstetrics & Gynecology

## 2020-05-09 ENCOUNTER — Other Ambulatory Visit: Payer: Self-pay

## 2020-05-09 ENCOUNTER — Encounter: Payer: Self-pay | Admitting: Obstetrics & Gynecology

## 2020-05-09 ENCOUNTER — Encounter: Admission: RE | Payer: Self-pay | Source: Home / Self Care

## 2020-05-09 ENCOUNTER — Ambulatory Visit: Payer: PPO | Admitting: Obstetrics & Gynecology

## 2020-05-09 VITALS — BP 158/89 | HR 69

## 2020-05-09 DIAGNOSIS — R9389 Abnormal findings on diagnostic imaging of other specified body structures: Secondary | ICD-10-CM

## 2020-05-09 DIAGNOSIS — N83209 Unspecified ovarian cyst, unspecified side: Secondary | ICD-10-CM

## 2020-05-09 SURGERY — EGD (ESOPHAGOGASTRODUODENOSCOPY)
Anesthesia: General

## 2020-05-09 NOTE — Progress Notes (Signed)
GYNECOLOGY  VISIT  CC:   Discuss ultrasound and endometrial biopsy  HPI: 65 y.o. G20P2012 Married White or Caucasian female here for endometrial biopsy.  Ultrasound was done after last visit due to hx of ovarian cysts and patients concerns about possible cancer as brother passed this past year with metastatic gastric cancer.  Pt's spouse accompanied her today.  They have questions about the results and whether hysterectomy should be performed.  All questions addressed prior to performing biopsy.  I do not see any indication at this point for hysterectomy.    GYNECOLOGIC HISTORY: Patient's last menstrual period was 12/06/2011. Contraception: PMP Menopausal hormone therapy: none except vaginal estrogen cream  Patient Active Problem List   Diagnosis Date Noted  . TIA (transient ischemic attack)   . Hypertension   . Tubular adenoma 12/09/2019  . Family history of hypertrophic cardiomyopathy 03/10/2018  . LLQ pain 11/12/2016  . Left ovarian cyst 11/12/2016  . Noninfectious gastroenteritis and colitis 02/24/2015  . External hemorrhoid 09/23/2014  . Attention and concentration deficit 12/18/2013  . Edema 10/15/2013  . Intention tremor 01/26/2013  . Insomnia 09/24/2012  . Other and unspecified hyperlipidemia 08/28/2012  . Obesity (BMI 30-39.9) 08/28/2012  . ANXIETY 05/19/2010  . DEPRESSION 05/19/2010  . GERD 05/19/2010  . 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 Abnormal Pap smear   . Anxiety   . Basal cell adenocarcinoma    skin, face  . Colitis 01/05/15   treated wtih antibiotics  . Depression   . GERD (gastroesophageal reflux disease)   . Hypertension    started on medication for LEE  . TIA (transient ischemic attack)    initially question as TIA, evaluation negative.  Neurologist felt this was anxiety related.    Past Surgical History:  Procedure Laterality Date  . CERVICAL BIOPSY  W/ LOOP ELECTRODE EXCISION     . CESAREAN SECTION     2, FTP with fetal distress  . COLONOSCOPY  01/2015   repeat 5 years  . HEMORRHOID BANDING  11/2014   Dr. Tamala Julian, [place]  . OOPHORECTOMY  2001   for recurrent ovarian cyst    MEDS:   Current Outpatient Medications on File Prior to Visit  Medication Sig Dispense Refill  . ALPRAZolam (XANAX) 0.25 MG tablet Take 1 tablet (0.25 mg total) by mouth 3 (three) times daily as needed for sleep or anxiety (Take 1 tablet by mouth every 8 hours as needed for anxiety). 30 tablet 1  . estradiol (ESTRACE) 0.1 MG/GM vaginal cream 1 gram vaginally twice weekly 42.5 g 6  . furosemide (LASIX) 20 MG tablet Take 20 mg by mouth daily.     Marland Kitchen MAGNESIUM PO Take by mouth daily.    . meloxicam (MOBIC) 15 MG tablet Take 1 tablet (15 mg total) by mouth daily. 30 tablet 3  . potassium chloride (MICRO-K) 10 MEQ CR capsule Take by mouth.    . sertraline (ZOLOFT) 25 MG tablet TAKE 3 TABLETS BY MOUTH DAILY 270 tablet 0  . temazepam (RESTORIL) 30 MG capsule TAKE 1 CAPSULE AT BEDTIME AS NEEDED INSOMNIA 30 capsule 5   No current facility-administered medications on file prior to visit.    ALLERGIES: Penicillins  Family History  Problem Relation Age of Onset  . Diabetes Mother   . Hypertension Mother   . Heart disease Father   . Diabetes Brother     SH:  Married, non smoker  Review of  Systems  Psychiatric/Behavioral: The patient is nervous/anxious (about procedure/cancer risk).   All other systems reviewed and are negative.   PHYSICAL EXAMINATION:    BP (!) 158/89   Pulse 69   LMP 12/06/2011     General appearance: alert, cooperative and appears stated age Lymph:  no inguinal LAD noted  Pelvic: External genitalia:  no lesions              Urethra:  normal appearing urethra with no masses, tenderness or lesions              Bartholins and Skenes: normal                 Vagina: normal appearing vagina with normal color and discharge, no lesions              Cervix: no  lesions  Endometrial biopsy recommended.  Discussed with patient.  Verbal and written consent obtained.   Procedure:  Speculum placed.  Cervix visualized and cleansed with betadine prep.  A single toothed tenaculum was applied to the anterior lip of the cervix. Milex dilator needed due to cervical stenosis.  Endometrial pipelle was advanced through the cervix into the endometrial cavity without difficulty.  Pipelle passed to 6.5cm.  Suction applied and pipelle removed with good tissue sample obtained.  Tenculum removed.  No bleeding noted.  Patient tolerated procedure well.  Chaperone, Zenovia Jordan, CMA, was present for exam.  Assessment/Plan: 1. Thickened endometrium - Surgical pathology( [place]/ [facility])  2. Cyst of ovary, unspecified laterality - Ultrasound findings reviewed and no additional ultrasound follow up recommended unless she has new symptoms/issues.    Prior to procedure, questions address with pt and spouse.  This was in additional to typical risk/benefit discussion regarding planned procedure.  About 10 minutes spent in additional discussion prior to procedure.

## 2020-05-09 NOTE — Patient Instructions (Signed)

## 2020-05-11 LAB — SURGICAL PATHOLOGY

## 2020-05-26 ENCOUNTER — Other Ambulatory Visit
Admission: RE | Admit: 2020-05-26 | Discharge: 2020-05-26 | Disposition: A | Discharge status: Home / Self Care | Payer: PPO | Source: Ambulatory Visit | Attending: Surgery | Admitting: Surgery

## 2020-05-26 ENCOUNTER — Other Ambulatory Visit: Payer: Self-pay

## 2020-05-26 DIAGNOSIS — Z20822 Contact with and (suspected) exposure to covid-19: Secondary | ICD-10-CM | POA: Diagnosis not present

## 2020-05-26 DIAGNOSIS — Z01812 Encounter for preprocedural laboratory examination: Secondary | ICD-10-CM | POA: Insufficient documentation

## 2020-05-26 LAB — SARS CORONAVIRUS 2 (TAT 6-24 HRS): SARS Coronavirus 2: NEGATIVE

## 2020-05-30 ENCOUNTER — Encounter: Payer: Self-pay | Admitting: Surgery

## 2020-05-30 ENCOUNTER — Other Ambulatory Visit: Payer: PPO | Admitting: Obstetrics & Gynecology

## 2020-05-30 ENCOUNTER — Encounter
Admission: RE | Disposition: A | Discharge status: Home / Self Care | Payer: Self-pay | Source: Home / Self Care | Attending: Surgery

## 2020-05-30 ENCOUNTER — Ambulatory Visit: Payer: PPO | Admitting: Anesthesiology

## 2020-05-30 ENCOUNTER — Ambulatory Visit
Admission: RE | Admit: 2020-05-30 | Discharge: 2020-05-30 | Disposition: A | Discharge status: Home / Self Care | Payer: PPO | Attending: Surgery | Admitting: Surgery

## 2020-05-30 DIAGNOSIS — Z79899 Other long term (current) drug therapy: Secondary | ICD-10-CM | POA: Insufficient documentation

## 2020-05-30 DIAGNOSIS — Z86010 Personal history of colonic polyps: Secondary | ICD-10-CM | POA: Diagnosis not present

## 2020-05-30 DIAGNOSIS — Z800 Family history of malignant neoplasm of digestive organs: Secondary | ICD-10-CM | POA: Diagnosis not present

## 2020-05-30 DIAGNOSIS — Z8673 Personal history of transient ischemic attack (TIA), and cerebral infarction without residual deficits: Secondary | ICD-10-CM | POA: Insufficient documentation

## 2020-05-30 DIAGNOSIS — Z79890 Hormone replacement therapy (postmenopausal): Secondary | ICD-10-CM | POA: Diagnosis not present

## 2020-05-30 DIAGNOSIS — Z1211 Encounter for screening for malignant neoplasm of colon: Secondary | ICD-10-CM | POA: Diagnosis not present

## 2020-05-30 DIAGNOSIS — R1310 Dysphagia, unspecified: Secondary | ICD-10-CM | POA: Insufficient documentation

## 2020-05-30 DIAGNOSIS — K5730 Diverticulosis of large intestine without perforation or abscess without bleeding: Secondary | ICD-10-CM | POA: Insufficient documentation

## 2020-05-30 DIAGNOSIS — Z8719 Personal history of other diseases of the digestive system: Secondary | ICD-10-CM | POA: Diagnosis not present

## 2020-05-30 DIAGNOSIS — K219 Gastro-esophageal reflux disease without esophagitis: Secondary | ICD-10-CM | POA: Diagnosis not present

## 2020-05-30 DIAGNOSIS — R1030 Lower abdominal pain, unspecified: Secondary | ICD-10-CM | POA: Diagnosis not present

## 2020-05-30 DIAGNOSIS — Z85828 Personal history of other malignant neoplasm of skin: Secondary | ICD-10-CM | POA: Diagnosis not present

## 2020-05-30 DIAGNOSIS — K640 First degree hemorrhoids: Secondary | ICD-10-CM | POA: Diagnosis not present

## 2020-05-30 DIAGNOSIS — Z791 Long term (current) use of non-steroidal anti-inflammatories (NSAID): Secondary | ICD-10-CM | POA: Insufficient documentation

## 2020-05-30 DIAGNOSIS — K5909 Other constipation: Secondary | ICD-10-CM | POA: Insufficient documentation

## 2020-05-30 DIAGNOSIS — K5790 Diverticulosis of intestine, part unspecified, without perforation or abscess without bleeding: Secondary | ICD-10-CM | POA: Diagnosis not present

## 2020-05-30 DIAGNOSIS — Z880 Allergy status to penicillin: Secondary | ICD-10-CM | POA: Diagnosis not present

## 2020-05-30 DIAGNOSIS — K649 Unspecified hemorrhoids: Secondary | ICD-10-CM | POA: Diagnosis not present

## 2020-05-30 HISTORY — PX: COLONOSCOPY WITH PROPOFOL: SHX5780

## 2020-05-30 HISTORY — PX: ESOPHAGOGASTRODUODENOSCOPY: SHX5428

## 2020-05-30 SURGERY — COLONOSCOPY WITH PROPOFOL
Anesthesia: General

## 2020-05-30 MED ORDER — LIDOCAINE HCL (PF) 2 % IJ SOLN
INTRAMUSCULAR | Status: AC
  Filled 2020-05-30: qty 5

## 2020-05-30 MED ORDER — SODIUM CHLORIDE 0.9 % IV SOLN: INTRAVENOUS | Status: DC

## 2020-05-30 MED ORDER — GLYCOPYRROLATE 0.2 MG/ML IJ SOLN
INTRAMUSCULAR | Status: DC | PRN
  Administered 2020-05-30: .2 mg via INTRAVENOUS

## 2020-05-30 MED ORDER — LIDOCAINE 2% (20 MG/ML) 5 ML SYRINGE
INTRAMUSCULAR | Status: DC | PRN
  Administered 2020-05-30: 100 mg via INTRAVENOUS

## 2020-05-30 MED ORDER — MIDAZOLAM HCL 2 MG/2ML IJ SOLN
INTRAMUSCULAR | Status: DC | PRN
  Administered 2020-05-30: 2 mg via INTRAVENOUS

## 2020-05-30 MED ORDER — GLYCOPYRROLATE 0.2 MG/ML IJ SOLN
INTRAMUSCULAR | Status: AC
  Filled 2020-05-30: qty 1

## 2020-05-30 MED ORDER — PROPOFOL 500 MG/50ML IV EMUL
INTRAVENOUS | Status: DC | PRN
  Administered 2020-05-30: 200 ug/kg/min via INTRAVENOUS

## 2020-05-30 MED ORDER — PROPOFOL 10 MG/ML IV BOLUS
INTRAVENOUS | Status: DC | PRN
  Administered 2020-05-30: 100 mg via INTRAVENOUS

## 2020-05-30 MED ORDER — MIDAZOLAM HCL 2 MG/2ML IJ SOLN
INTRAMUSCULAR | Status: AC
  Filled 2020-05-30: qty 2

## 2020-05-30 NOTE — Op Note (Signed)
[facility] Gastroenterology Patient Name: Carla Fuentes Procedure Date: 05/30/2020 11:34 AM MRN: [id number] Account #: [id number] Date of Birth: 1956/04/13 Admit Type: Outpatient Age: 65 Room: [facility] ENDO ROOM 2 Gender: Female Note Status: Finalized Procedure:             Upper GI endoscopy Indications:           Heartburn Providers:             Eliseo Squires MD, MD Medicines:             Propofol per Anesthesia Complications:         No immediate complications. Procedure:             Pre-Anesthesia Assessment:                        - After reviewing the risks and benefits, the patient                         was deemed in satisfactory condition to undergo the                         procedure in an ambulatory setting.                        After obtaining informed consent, the endoscope was                         passed under direct vision. Throughout the procedure,                         the patient's blood pressure, pulse, and oxygen                         saturations were monitored continuously. The Endoscope                         was introduced through the mouth, and advanced to the                         second part of duodenum. The upper GI endoscopy was                         accomplished without difficulty. The patient tolerated                         the procedure well. Findings:      The esophagus was normal.      The stomach was normal.      The examined duodenum was normal.      Biopsies were taken with a cold forceps in the gastric fundus for       Helicobacter pylori testing. Estimated blood loss was minimal.      Biopsy with a cold forceps at the gastroesophageal junction was       performed for histology. Impression:            - Normal esophagus.                        - Normal stomach.                        -  Normal examined duodenum.                        - Biopsies were taken with a cold forceps for                          Helicobacter pylori testing.                        - Biopsy was performed at the gastroesophageal                         junction. Recommendation:        - Written discharge instructions were provided to the                         patient.                        - Discharge patient to home.                        - Resume previous diet.                        - Written discharge instructions were provided to the                         patient.                        - Repeat upper endoscopy as needed. Procedure Code(s):     --- Professional ---                        [phone], Esophagogastroduodenoscopy, flexible,                         transoral; with biopsy, single or multiple Diagnosis Code(s):     --- Professional ---                        R12, Heartburn CPT copyright 2019 American Medical Association. All rights reserved. The codes documented in this report are preliminary and upon coder review may  be revised to meet current compliance requirements. Dr. Sheppard Penton, MD Eliseo Squires MD, MD 05/30/2020 2:02:20 PM This report has been signed electronically. Number of Addenda: 0 Note Initiated On: 05/30/2020 11:34 AM Estimated Blood Loss:  Estimated blood loss was minimal.      [facility]

## 2020-05-30 NOTE — H&P (Signed)
PATIENT PROFILE: Carla Fuentes is a 65 y.o. female who presents to the [place] for a consultation at the request of Dr. Sabra Heck for evaluation of colon cancer screening and GERD  HISTORY OF PRESENT ILLNESS: Carla Fuentes reports having GERD symptoms, she reports having a long history of very intermittent GERD symptoms. This is worse in the evening, particularly if she does some bending over after meals. She is complaint w/ remaining upright after meals. She currently uses very rare reflux medication on demand but does not use routine reflux therapy. She tries to limit Carafate. She has occasional dysphagia to chips in her upper esophageal area but is able to pass them with repetitive swallows or drinking liquids. Denies any other solid liquid or pill dysphagia. She denies any nausea or vomiting.  She reports had a long history of constipation chronically but this actually resolved after she started magnesium supplement she is now having a BM daily. She does have some very mild lower abdominal pain that is likely related to GYN and has an endometrial biopsy pending. Denies any rectal bleeding or melena.  She reports having significant stress from her brother who recently passed away this year from gastric cancer. She has also had recurrent infections in the multiple regimens of antibiotics over past few months. No known first degree relatives w/ colon polyps or colon cancer.  GENERAL REVIEW OF SYSTEMS:  Constitutional: No weight loss/weight gain  Eyes: No changes in vision. ENT: No oral lesions, sore throat.  GI: see HPI.  Heme/Lymph: No easy bruising.  CV: No chest pain. Resp: No cough, SOB.  GU: No hematuria.  Integumentary: No rashes.  Neuro: No headaches.  Psych: No depression/anxiety.  Endocrine: No heat/cold intolerance.  Musculoskeletal: No joint swelling.   MEDICATIONS: Outpatient Encounter Medications as of 04/25/2020  Medication Sig Dispense Refill  . ALPRAZolam (XANAX)  0.25 MG tablet Take 0.25 mg by mouth nightly as needed for Sleep  . estradioL (ESTRACE) 0.01 % (0.1 mg/gram) vaginal cream 1 gram vaginally twice weekly  . FUROsemide (LASIX) 20 MG tablet Take 2 tablets (40 mg total) by mouth once daily 180 tablet 3  . MAGNESIUM ORAL Take by mouth once daily  . meloxicam (MOBIC) 15 MG tablet Take by mouth  . potassium chloride (MICRO-K) 10 MEQ ER capsule Take 2 capsules (20 mEq total) by mouth once daily 180 capsule 3  . progesterone (PROMETRIUM) 100 MG capsule TAKE 1 CAPSULE (100 MG TOTAL) BY MOUTH ONCE DAILY 90 capsule 3  . sertraline (ZOLOFT) 50 MG tablet TAKE 1 TABLET BY MOUTH EVERY DAY (Patient taking differently: 75 mg ) 90 tablet 3  . temazepam (RESTORIL) 30 mg capsule Take 1 capsule (30 mg total) by mouth nightly as needed for Sleep 30 capsule 5  . ondansetron (ZOFRAN-ODT) 4 MG disintegrating tablet Take 1 tablet (4 mg total) by mouth every 8 (eight) hours as needed for Nausea 15 tablet 0   No facility-administered encounter medications on file as of 04/25/2020.   ALLERGIES: Penicillins  PAST MEDICAL HISTORY: Past Medical History:  Diagnosis Date  . Asymptomatic postmenopausal state 09/14/2015  . Benign essential hypertension 09/14/2015  . Chronic constipation 09/14/2015   PAST SURGICAL HISTORY: Past Surgical History:  Procedure Laterality Date  . CESAREAN SECTION  x2  . COLONOSCOPY 08/18/2009  Dr. Kennis Carina @ [facility] - Adenomatous Polyp: CBF 08/2014; recall 06/28/2014 (dw)  . COLONOSCOPY 01/25/2015  Dr. Kennis Carina @ [facility]: CBF 01/2020  . COLONOSCOPY  .  EGD 03/02/2010  No repeat per RTE  . OOPHORECTOMY  1 removed  . UPPER GASTROINTESTINAL ENDOSCOPY    FAMILY HISTORY: Family History  Problem Relation Age of Onset  . Other Mother  colon infection  . Diabetes type II Mother  . Heart failure Mother  . High blood pressure (Hypertension) Mother  . Myocardial Infarction (Heart attack) Father  . Cancer Brother 60  Gastric Cancer     SOCIAL HISTORY: Social History   Socioeconomic History  . Marital status: Married  Spouse name: Not on file  . Number of children: 2  . Years of education: 14  . Highest education level: Not on file  Occupational History  . Not on file  Tobacco Use  . Smoking status: Never Smoker  . Smokeless tobacco: Never Used  Substance and Sexual Activity  . Alcohol use: Yes  Alcohol/week: 0.0 standard drinks  Comment: occassionally  . Drug use: No  . Sexual activity: Defer  Other Topics Concern  . Not on file  Social History Narrative  . Not on file   Social Determinants of Health   Financial Resource Strain: Not on file  Food Insecurity: Not on file  Transportation Needs: Not on file   Vitals:  04/25/20 1459  BP: 131/75  BP Location: Left upper arm  Patient Position: Sitting  BP Cuff Size: Adult  Pulse: 68   Wt Readings from Last 3 Encounters:  05/12/19 76.7 kg (169 lb)  10/02/18 87.5 kg (193 lb)  03/10/18 86.2 kg (190 lb)   PHYSICAL EXAM: General: NAD, alert and oriented x 4 HEENT: PEERLA, EOMI, anciteric  Neck: supple, no JVD or thyromegaly. No lymphadenopathy.  Respiratory: CTA bilaterally, no wheezes, crackles, or other adventitious sounds Cardiac: RRR, no murmur, rub, or gallop  Abd: soft, normal bowel sounds, no TTP, no HSM, no rebound or guarding Ext: no edema, well perfused with 2+ pulses, Skin: Skin color, texture, turgor normal, no rashes or lesions Lymph: no LAD Neuro: Grossly intact   REVIEW OF DATA: I have reviewed the following data today:  01/2020--CMP, CBC normal.   09/2019--CBC normal B12 normal.   Colonoscopy 01/2015 - hx of polyps - erythematous mucosa rectum, sigmoid, descending, erosions in rectum.   ASSESSMENT AND PLAN: Carla Fuentes is a 65 y.o. female presenting for an initial consultation. Diagnoses and all orders for this visit:  History of adenomatous polyp of colon - Ambulatory Referral to Upper Endoscopy - Ambulatory Referral to  Colonoscopy  Gastroesophageal reflux disease, unspecified whether esophagitis present - Ambulatory Referral to Upper Endoscopy - Ambulatory Referral to Colonoscopy  Other orders - ondansetron (ZOFRAN-ODT) 4 MG disintegrating tablet; Take 1 tablet (4 mg total) by mouth every 8 (eight) hours as needed for Nausea  1. History of colon polyps-she is due for repeat colonoscopy. Will schedule colonoscopy for evaluation. She has a history of chronic constipation in the past but is now moving her stools daily. Reports issues w/ nausea with vomiting with each prior colon prep. Will Rx Zofran to use prior to colonoscopy prep.  2. GERD-occasional symptoms and her brother recently passed from gastric cancer. Will add on upper endoscopy. We discussed starting a PPI low-dose daily but she prefers to use on-demand therapy which is reasonable given recent symptoms.  She denies prior history of issues with sedation. She does not use Mobic anymore or other NSAIDs.  I reviewed the risks (including bleeding, perforation, infection, anesthesia complications, cardiac/respiratory complications), benefits and alternatives of egd. Patient consents to proceed. Denies  CP/SOB/blood thinners.   I personally performed the service, non-incident to. [facility])   JANICE BRIDGES [place], PA  [place] [facility] GI

## 2020-05-30 NOTE — Op Note (Addendum)
[facility] Gastroenterology Patient Name: Carla Fuentes Procedure Date: 05/30/2020 11:33 AM MRN: [id number] Account #: [id number] Date of Birth: 09-Aug-1955 Admit Type: Outpatient Age: 65 Room: [facility] ENDO ROOM 2 Gender: Female Note Status: Supervisor Override Procedure:             Colonoscopy Indications:           Personal history of colonic polyps Providers:             Eliseo Squires MD, MD Medicines:             Propofol per Anesthesia Complications:         No immediate complications. Procedure:             Pre-Anesthesia Assessment:                        - After reviewing the risks and benefits, the patient                         was deemed in satisfactory condition to undergo the                         procedure in an ambulatory setting.                        After obtaining informed consent, the colonoscope was                         passed under direct vision. Throughout the procedure,                         the patient's blood pressure, pulse, and oxygen                         saturations were monitored continuously. The                         Colonoscope was introduced through the anus and                         advanced to the the cecum, identified by the ileocecal                         valve. The colonoscopy was performed without                         difficulty. The patient tolerated the procedure well.                         The quality of the bowel preparation was good. Findings:      firmness noted, possible uterus?      Non-bleeding internal hemorrhoids were found during retroflexion. The       hemorrhoids were Grade I (internal hemorrhoids that do not prolapse).      Multiple small and large-mouthed diverticula were found in the sigmoid       colon. Impression:            - Non-bleeding internal hemorrhoids.                        -  Diverticulosis in the sigmoid colon.                        - No specimens collected. Recommendation:         - Discharge patient to home.                        - Resume previous diet.                        - Written discharge instructions were provided to the                         patient.                        - Repeat colonoscopy in 10 years for screening                         purposes. Procedure Code(s):     --- Professional ---                        [phone], Colonoscopy, flexible; diagnostic, including                         collection of specimen(s) by brushing or washing, when                         performed (separate procedure) Diagnosis Code(s):     --- Professional ---                        Z12.11, Encounter for screening for malignant neoplasm                         of colon                        K64.0, First degree hemorrhoids                        K57.30, Diverticulosis of large intestine without                         perforation or abscess without bleeding CPT copyright 2019 American Medical Association. All rights reserved. The codes documented in this report are preliminary and upon coder review may  be revised to meet current compliance requirements. Dr. Sheppard Penton, MD Eliseo Squires MD, MD 05/30/2020 9:67:89 PM This report has been signed electronically. Number of Addenda: 0 Note Initiated On: 05/30/2020 11:33 AM Scope Withdrawal Time: 0 hours 6 minutes 7 seconds  Total Procedure Duration: 0 hours 15 minutes 55 seconds  Estimated Blood Loss:  Estimated blood loss: none.      [facility]

## 2020-05-30 NOTE — Interval H&P Note (Signed)
History and Physical Interval Note:  05/30/2020 1:00 PM  Carla Fuentes  has presented today for surgery, with the diagnosis of HX ADEN POLYPS GERD.  The various methods of treatment have been discussed with the patient and family. After consideration of risks, benefits and other options for treatment, the patient has consented to  Procedure(s): COLONOSCOPY WITH PROPOFOL (N/A) ESOPHAGOGASTRODUODENOSCOPY (EGD) (N/A) as a surgical intervention.  The patient's history has been reviewed, patient examined, no change in status, stable for surgery.  I have reviewed the patient's chart and labs.  Questions were answered to the patient's satisfaction.    Hx of "stomach" cancer brother.  Pt anxious of results today.  Will proceed with EGD and colonoscopy as scheduled   [name]

## 2020-05-30 NOTE — Anesthesia Postprocedure Evaluation (Signed)
Anesthesia Post Note  Patient: MYKHIA DANISH  Procedure(s) Performed: COLONOSCOPY WITH PROPOFOL (N/A ) ESOPHAGOGASTRODUODENOSCOPY (EGD) (N/A )  Patient location during evaluation: Endoscopy Anesthesia Type: General Level of consciousness: awake and alert Pain management: pain level controlled Vital Signs Assessment: post-procedure vital signs reviewed and stable Respiratory status: spontaneous breathing, nonlabored ventilation, respiratory function stable and patient connected to nasal cannula oxygen Cardiovascular status: blood pressure returned to baseline and stable Postop Assessment: no apparent nausea or vomiting Anesthetic complications: no   No complications documented.   Last Vitals:  Vitals:   05/30/20 1428 05/30/20 1438  BP: (!) 152/79 (!) 150/78  Pulse: 74 69  Resp: 15 11  Temp:    SpO2: 97% 98%    Last Pain:  Vitals:   05/30/20 1438  TempSrc:   PainSc: 0-No pain                 Precious Haws Keanna Tugwell

## 2020-05-30 NOTE — Transfer of Care (Signed)
Immediate Anesthesia Transfer of Care Note  Patient: Carla Fuentes  Procedure(s) Performed: COLONOSCOPY WITH PROPOFOL (N/A ) ESOPHAGOGASTRODUODENOSCOPY (EGD) (N/A )  Patient Location: Endoscopy Unit  Anesthesia Type:General  Level of Consciousness: awake  Airway & Oxygen Therapy: Patient Spontanous Breathing and Patient connected to nasal cannula oxygen  Post-op Assessment: Post -op Vital signs reviewed and stable  Post vital signs: stable  Last Vitals:  Vitals Value Taken Time  BP 116/66 05/30/20 1359  Temp    Pulse 94 05/30/20 1359  Resp 10 05/30/20 1359  SpO2 97 % 05/30/20 1359  Vitals shown include unvalidated device data.  Last Pain:  Vitals:   05/30/20 1229  TempSrc: Temporal         Complications: No complications documented.

## 2020-05-30 NOTE — Anesthesia Preprocedure Evaluation (Addendum)
Anesthesia Evaluation  Patient identified by MRN, date of birth, ID band Patient awake    Reviewed: Allergy & Precautions, H&P , NPO status , Patient's Chart, lab work & pertinent test results  History of Anesthesia Complications Negative for: history of anesthetic complications  Airway Mallampati: II  TM Distance: >3 FB     Dental  (+) Teeth Intact   Pulmonary neg pulmonary ROS, neg sleep apnea, neg COPD,    breath sounds clear to auscultation       Cardiovascular hypertension, (-) angina(-) Past MI and (-) Cardiac Stents (-) dysrhythmias  Rhythm:regular Rate:Normal     Neuro/Psych PSYCHIATRIC DISORDERS Anxiety Depression TIA   GI/Hepatic Neg liver ROS, GERD  ,  Endo/Other  negative endocrine ROS  Renal/GU negative Renal ROS  negative genitourinary   Musculoskeletal   Abdominal   Peds  Hematology negative hematology ROS (+)   Anesthesia Other Findings Pt extremely anxious  Past Medical History: No date: Abnormal Pap smear No date: Anxiety No date: Basal cell adenocarcinoma     Comment:  skin, face 01/05/15: Colitis     Comment:  treated wtih antibiotics No date: Depression No date: GERD (gastroesophageal reflux disease) No date: Hypertension     Comment:  started on medication for LEE No date: TIA (transient ischemic attack)     Comment:  initially question as TIA, evaluation negative.                Neurologist felt this was anxiety related.  Past Surgical History: No date: CERVICAL BIOPSY  W/ LOOP ELECTRODE EXCISION No date: CESAREAN SECTION     Comment:  2, FTP with fetal distress 01/2015: COLONOSCOPY     Comment:  repeat 5 years 11/2014: HEMORRHOID BANDING     Comment:  Dr. Tamala Julian, [place] 2001: OOPHORECTOMY     Comment:  for recurrent ovarian cyst  BMI    Body Mass Index: 28.32 kg/m      Reproductive/Obstetrics negative OB ROS                           Anesthesia  Physical Anesthesia Plan  ASA: II  Anesthesia Plan: General   Post-op Pain Management:    Induction:   PONV Risk Score and Plan: Propofol infusion and TIVA  Airway Management Planned: Nasal Cannula  Additional Equipment:   Intra-op Plan:   Post-operative Plan:   Informed Consent: I have reviewed the patients History and Physical, chart, labs and discussed the procedure including the risks, benefits and alternatives for the proposed anesthesia with the patient or authorized representative who has indicated his/her understanding and acceptance.     Dental Advisory Given  Plan Discussed with: Anesthesiologist, CRNA and Surgeon  Anesthesia Plan Comments:         Anesthesia Quick Evaluation

## 2020-05-31 ENCOUNTER — Encounter: Payer: Self-pay | Admitting: Surgery

## 2020-06-01 LAB — SURGICAL PATHOLOGY

## 2020-06-01 NOTE — OR Nursing (Signed)
Pt. Called and stated that her neck was swollen on both sides. She wants to know why. I told her that I would talk with Dr Kayleen Memos and Dr Lysle Pearl which I did . Dr Kayleen Memos stated that he did not think it was from anesthia and for me to call Dr Lysle Pearl. I called Dr Lysle Pearl and he stated that he did not think it was from the procedures and to tell her to call her Medical doctor.[ Dr Miller].        I called the patient back and she said '' what do they mean, its all over the internet'' that anesthia causes this problem. I told her that she should call Dr Lysle Pearl if she was unhappy and discuss it with him.

## 2020-06-09 DIAGNOSIS — R300 Dysuria: Secondary | ICD-10-CM | POA: Diagnosis not present

## 2020-07-14 ENCOUNTER — Encounter (HOSPITAL_BASED_OUTPATIENT_CLINIC_OR_DEPARTMENT_OTHER): Payer: Self-pay | Admitting: Obstetrics & Gynecology

## 2020-07-14 ENCOUNTER — Ambulatory Visit (INDEPENDENT_AMBULATORY_CARE_PROVIDER_SITE_OTHER): Payer: PPO | Admitting: Obstetrics & Gynecology

## 2020-07-14 ENCOUNTER — Other Ambulatory Visit: Payer: Self-pay

## 2020-07-14 VITALS — BP 128/79 | HR 70

## 2020-07-14 DIAGNOSIS — Z79890 Hormone replacement therapy (postmenopausal): Secondary | ICD-10-CM

## 2020-07-14 DIAGNOSIS — Z8481 Family history of carrier of genetic disease: Secondary | ICD-10-CM | POA: Diagnosis not present

## 2020-07-14 DIAGNOSIS — N83202 Unspecified ovarian cyst, left side: Secondary | ICD-10-CM

## 2020-07-14 DIAGNOSIS — Z711 Person with feared health complaint in whom no diagnosis is made: Secondary | ICD-10-CM | POA: Diagnosis not present

## 2020-07-15 ENCOUNTER — Telehealth: Payer: Self-pay | Admitting: Genetic Counselor

## 2020-07-15 ENCOUNTER — Other Ambulatory Visit: Payer: Self-pay

## 2020-07-15 NOTE — Telephone Encounter (Signed)
Received a genetic counseling referral from Dr. Sabra Heck for family history of BRCA gene mutation. Pt has been cld and scheduled to see Santiago Glad on 3/23 at 2pm. Pt aware to arrive 15 minutes early.

## 2020-07-15 NOTE — Progress Notes (Signed)
GYNECOLOGY  VISIT  CC:   HRT restart discussion, question from colonoscopy done 05/30/2020 at [facility] in [place]  HPI: 65 y.o. [id number] Married White or Caucasian female here for discussion of HRT that was restarted by another provider.  Pt reported having hot flashes and vaginal dryness.  I previously recommended a discussion regarding risks and benefits as she has been off HRT now >1 year and her brother recently passed of stomach cancer.  She's subsequently had a lot of concerns about her personal risk of cancer.  She reached out to her PCP, who sent in a new prescription.  With all of her concern about cancer, I was just surprised how quickly she started HRT.  Pt is here for discussion.  She is on 76m estradiol and progesterone.  We discussed risks including breast cancer, DVT, PE, stroke.  We discussed the differences in starting HRT around perimenopause versus now, at age [age]and after being off HRT for more than a year.  Of course there are benefits and she has to decide if the benefits outweigh the risks.  She has noted increased aching in her legs as well as breast fullness.  She understands now these are both estrogen side effects.  She may want to stay on HRT.  We discussed, at least, decreasing the dosage to 1/223mestradiol to see how symptoms improve.  Also, discussed with pt risks of having vaginal bleeding with HRT as well.  Pt also reports she's recently learned her brother, who recently died, had a BRCA gene.  She does not have the report and her sister in law has frontal lobe dementia and cannot help with getting the report.  Her brother was managing their affairs prior to his rapid decline.  She is aware if this is positive, she will have many decisions to consider.  Genetic counseling and testing recommended.  Lastly, colonoscopy report from 05/30/2020 showed there was some thickening in a portion of the colon and provider questioned if it was the uterus.  Pt has undergone  ultrasound and endometrial biopsy within the last 2 1/2 months so I do not think any additional testing from gyn testing is warranted.  FYI--PMHx shows TIA but evaluation from neurology ultimately felt this was stress related.  Patient Active Problem List   Diagnosis Date Noted  . TIA (transient ischemic attack)   . Hypertension   . Tubular adenoma 12/09/2019  . Family history of hypertrophic cardiomyopathy 03/10/2018  . LLQ pain 11/12/2016  . Left ovarian cyst 11/12/2016  . Noninfectious gastroenteritis and colitis 02/24/2015  . External hemorrhoid 09/23/2014  . Attention and concentration deficit 12/18/2013  . Edema 10/15/2013  . Intention tremor 01/26/2013  . Insomnia 09/24/2012  . Other and unspecified hyperlipidemia 08/28/2012  . Obesity (BMI 30-39.9) 08/28/2012  . ANXIETY 05/19/2010  . DEPRESSION 05/19/2010  . GERD 05/19/2010  . 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 Abnormal Pap smear   . Anxiety   . Basal cell adenocarcinoma    skin, face  . Colitis 01/05/15   treated wtih antibiotics  . Depression   . GERD (gastroesophageal reflux disease)   . Hypertension    started on medication for LEE  . TIA (transient ischemic attack)    initially question as TIA, evaluation negative.  Neurologist felt this was anxiety related.    Past Surgical History:  Procedure Laterality Date  . CERVICAL BIOPSY  W/ LOOP ELECTRODE EXCISION    .  CESAREAN SECTION     2, FTP with fetal distress  . COLONOSCOPY  01/2015   repeat 5 years  . COLONOSCOPY WITH PROPOFOL N/A 05/30/2020   Procedure: COLONOSCOPY WITH PROPOFOL;  Surgeon: Benjamine Sprague, DO;  Location: [facility] ENDOSCOPY;  Service: General;  Laterality: N/A;  . ESOPHAGOGASTRODUODENOSCOPY N/A 05/30/2020   Procedure: ESOPHAGOGASTRODUODENOSCOPY (EGD);  Surgeon: Benjamine Sprague, DO;  Location: [facility] ENDOSCOPY;  Service: General;  Laterality: N/A;  . HEMORRHOID BANDING  11/2014   Dr.  Tamala Julian, [place]  . OOPHORECTOMY  2001   for recurrent ovarian cyst    MEDS:   Current Outpatient Medications on File Prior to Visit  Medication Sig Dispense Refill  . ALPRAZolam (XANAX) 0.25 MG tablet Take 1 tablet (0.25 mg total) by mouth 3 (three) times daily as needed for sleep or anxiety (Take 1 tablet by mouth every 8 hours as needed for anxiety). 30 tablet 1  . furosemide (LASIX) 20 MG tablet Take 20 mg by mouth daily.     Marland Kitchen MAGNESIUM PO Take by mouth daily.    . potassium chloride (MICRO-K) 10 MEQ CR capsule Take by mouth.    . progesterone (PROMETRIUM) 100 MG capsule Take 100 mg by mouth daily.    . sertraline (ZOLOFT) 25 MG tablet TAKE 3 TABLETS BY MOUTH DAILY 270 tablet 0  . temazepam (RESTORIL) 30 MG capsule TAKE 1 CAPSULE AT BEDTIME AS NEEDED INSOMNIA 30 capsule 5  . estradiol (ESTRACE) 0.1 MG/GM vaginal cream 1 gram vaginally twice weekly (Patient not taking: Reported on 07/14/2020) 42.5 g 6  . estradiol (ESTRACE) 1 MG tablet Take 1 mg by mouth daily. (Patient not taking: Reported on 07/14/2020)    . meloxicam (MOBIC) 15 MG tablet Take 1 tablet (15 mg total) by mouth daily. (Patient not taking: Reported on 07/14/2020) 30 tablet 3   No current facility-administered medications on file prior to visit.    ALLERGIES: Penicillins  Family History  Problem Relation Age of Onset  . Diabetes Mother   . Hypertension Mother   . Heart disease Father   . Diabetes Brother     SH:  Married, non smoker  Review of Systems  Constitutional: Negative.   Genitourinary: Negative.   Psychiatric/Behavioral: Negative.     PHYSICAL EXAMINATION:    BP 128/79   Pulse 70   LMP 12/06/2011     Physical Exam Constitutional:      Appearance: Normal appearance.  Neurological:     General: No focal deficit present.     Mental Status: She is alert.  Psychiatric:        Mood and Affect: Mood normal.        Behavior: Behavior normal.    Assessment/Plan: 1. Hormone replacement therapy  (HRT) - pt will decrease estradiol to 0.36m daily.  She will just cut in half what she has  2. Family history of BRCA gene mutation - Ambulatory referral to Genetics - will likely need additional discussion after this is completed  3. Concern about cancer without diagnosis - cancer screening is up to dat3  4. Left ovarian cyst - stable on ultrasound.  No dedicated follow up recommended.  35 minutes total time spent with pt

## 2020-07-27 ENCOUNTER — Encounter: Payer: PPO | Admitting: Genetic Counselor

## 2020-07-27 ENCOUNTER — Other Ambulatory Visit: Payer: PPO

## 2020-08-03 ENCOUNTER — Other Ambulatory Visit: Payer: PPO

## 2020-08-03 ENCOUNTER — Encounter: Payer: PPO | Admitting: Licensed Clinical Social Worker

## 2020-08-05 DIAGNOSIS — J019 Acute sinusitis, unspecified: Secondary | ICD-10-CM | POA: Diagnosis not present

## 2020-08-05 DIAGNOSIS — Z03818 Encounter for observation for suspected exposure to other biological agents ruled out: Secondary | ICD-10-CM | POA: Diagnosis not present

## 2020-08-05 DIAGNOSIS — H66002 Acute suppurative otitis media without spontaneous rupture of ear drum, left ear: Secondary | ICD-10-CM | POA: Diagnosis not present

## 2020-08-05 DIAGNOSIS — R110 Nausea: Secondary | ICD-10-CM | POA: Diagnosis not present

## 2020-08-05 DIAGNOSIS — H8309 Labyrinthitis, unspecified ear: Secondary | ICD-10-CM | POA: Diagnosis not present

## 2020-08-05 DIAGNOSIS — B9689 Other specified bacterial agents as the cause of diseases classified elsewhere: Secondary | ICD-10-CM | POA: Diagnosis not present

## 2020-08-10 ENCOUNTER — Inpatient Hospital Stay: Payer: PPO | Attending: Oncology | Admitting: Licensed Clinical Social Worker

## 2020-08-10 ENCOUNTER — Encounter: Payer: Self-pay | Admitting: Licensed Clinical Social Worker

## 2020-08-10 ENCOUNTER — Inpatient Hospital Stay: Payer: PPO

## 2020-08-10 ENCOUNTER — Other Ambulatory Visit: Payer: Self-pay

## 2020-08-10 DIAGNOSIS — Z800 Family history of malignant neoplasm of digestive organs: Secondary | ICD-10-CM | POA: Diagnosis not present

## 2020-08-10 DIAGNOSIS — Z808 Family history of malignant neoplasm of other organs or systems: Secondary | ICD-10-CM

## 2020-08-10 DIAGNOSIS — Z8041 Family history of malignant neoplasm of ovary: Secondary | ICD-10-CM | POA: Diagnosis not present

## 2020-08-10 DIAGNOSIS — Z801 Family history of malignant neoplasm of trachea, bronchus and lung: Secondary | ICD-10-CM

## 2020-08-10 NOTE — Progress Notes (Signed)
REFERRING PROVIDER: Megan Salon, MD [address],  [place] [address]  PRIMARY PROVIDER:  Rusty Aus, MD  PRIMARY REASON FOR VISIT:  1. Family history of stomach cancer   2. Family history of ovarian cancer   3. Family history of pancreatic cancer   4. Family history of lung cancer   5. Family history of thyroid cancer      HISTORY OF PRESENT ILLNESS:   Carla Fuentes, a 65 y.o. female, was seen for a [place] cancer genetics consultation at the request of Dr. Sabra Heck due to a family history of cancer.  Carla Fuentes presents to clinic today to discuss the possibility of a hereditary predisposition to cancer, genetic testing, and to further clarify her future cancer risks, as well as potential cancer risks for family members.   Carla Fuentes is a 65 y.o. female with no personal history of cancer aside from a basal cell carcinoma that was removed from her face.   CANCER HISTORY:  Oncology History   No history exists.     RISK FACTORS:  Menarche was at age 30 or 39.  First live birth at age 62.  OCP use for approximately more than 5 years years.  Ovaries intact: yes, has 1 ovary, other removed for cyst.  Hysterectomy: no.  Menopausal status: postmenopausal.  HRT use: 10-12 years. Colonoscopy: yes; normal. Mammogram within the last year: yes. Number of breast biopsies:0.  Up to date with pelvic exams: yes. Any excessive radiation exposure in the past: no  Past Medical History:  Diagnosis Date  . Abnormal Pap smear   . Anxiety   . Basal cell adenocarcinoma    skin, face  . Colitis 01/05/15   treated wtih antibiotics  . Depression   . Family history of lung cancer   . Family history of ovarian cancer   . Family history of pancreatic cancer   . Family history of stomach cancer   . Family history of thyroid cancer   . GERD (gastroesophageal reflux disease)   . Hypertension    started on medication for LEE  . TIA (transient ischemic attack)     initially question as TIA, evaluation negative.  Neurologist felt this was anxiety related.    Past Surgical History:  Procedure Laterality Date  . CERVICAL BIOPSY  W/ LOOP ELECTRODE EXCISION    . CESAREAN SECTION     2, FTP with fetal distress  . COLONOSCOPY  01/2015   repeat 5 years  . COLONOSCOPY WITH PROPOFOL N/A 05/30/2020   Procedure: COLONOSCOPY WITH PROPOFOL;  Surgeon: Benjamine Sprague, DO;  Location: [facility] ENDOSCOPY;  Service: General;  Laterality: N/A;  . ESOPHAGOGASTRODUODENOSCOPY N/A 05/30/2020   Procedure: ESOPHAGOGASTRODUODENOSCOPY (EGD);  Surgeon: Benjamine Sprague, DO;  Location: [facility] ENDOSCOPY;  Service: General;  Laterality: N/A;  . HEMORRHOID BANDING  11/2014   Dr. Tamala Julian, [place]  . OOPHORECTOMY  2001   for recurrent ovarian cyst    Social History   Socioeconomic History  . Marital status: Married    Spouse name: Not on file  . Number of children: Not on file  . Years of education: Not on file  . Highest education level: Not on file  Occupational History    Employer: SELF EMPLOYED    Comment: [employer]  Tobacco Use  . Smoking status: Never Smoker  . Smokeless tobacco: Never Used  Vaping Use  . Vaping Use: Never used  Substance and Sexual Activity  . Alcohol use: Yes  Alcohol/week: 0.0 - 1.0 standard drinks  . Drug use: No  . Sexual activity: Yes    Partners: Male    Birth control/protection: Post-menopausal    Comment: husband vasectomy  Other Topics Concern  . Not on file  Social History Narrative   Lives with husband, remarried. 2 children, 33YO and 35YO, one child lives [place].      Work - Public librarian      Diet - regular diet      Exercise - works out 3-4 times per week   Social Determinants of Radio broadcast assistant Strain: Not on [employer] Insecurity: Not on file  Transportation Needs: Not on file  Physical Activity: Not on file  Stress: Not on file  Social Connections: Not on file     FAMILY HISTORY:  We  obtained a detailed, 4-generation family history.  Significant diagnoses are listed below: Family History  Problem Relation Age of Onset  . Diabetes Mother   . Hypertension Mother   . Heart disease Father   . Diabetes Brother   . Stomach cancer Brother   . Ovarian cancer Paternal Aunt   . Cancer Paternal Uncle        unk type  . Cervical cancer Paternal Aunt   . Pancreatic cancer Cousin        d. 19s  . Cancer Cousin        blood cancer  . Other Cousin        d. brain tumor 30s/40s  . Lung cancer Cousin        x2; hx smoking  . Cancer Cousin        metastatic  . Thyroid cancer Niece        dx 74s, neg. GT   Carla Fuentes has a son and a daughter, no cancers. She had 1 full sister, 1 full brother and 4 maternal half brothers. Her full brother recently passed from stomach cancer. He had told Carla Fuentes and his children to have genetic testing as he had a BRCA mutation. Carla Fuentes was unable to obtain a copy of a report, so we reached out to several genetic testing labs and to the [place] [facility] to speak with a genetic counselor there. She confirmed he was seen in their clinic but did not have hereditary testing through them, and he also did not have any reports at the labs we called [facility], [facility] and [facility]). While we do not know for certain, it seems he did not have hereditary cancer testing. It is possible that he had somatic testing that was suggestive of a BRCA1/BRCA2 germline mutation, but we cannot confirm this. She also mentioned her brother may have also had prostate cancer and melanoma. Additionally, patient's sister reportedly has HCM.   Carla Fuentes mother died at 26, no cancers. Patient has limited information about maternal relatives. An aunt passed in her 49s due to cirrhosis of the liver. No known cancers on this side of the family.  Carla Fuentes father died at 46 of a heart attack. Patient had 1 paternal uncle and 5 paternal aunts. The uncle did have cancer  but unsure what type. His son had a blood cancer. A paternal aunt had cervical cancer and her daughter died of a brain tumor in her 46s. Another aunt had ovarian cancer in her 94s and died in her 44s. A first cousin had pancreatic cancer and passed in her 72s, and her daughter currently  has metastatic cancer. Two other second cousins have had cancer, both lung, both had history of smoking.  Carla Fuentes is unaware of previous family history of genetic testing for hereditary cancer risks. Patient's maternal ancestors are of English descent, and paternal ancestors are of Scottish/Irish descent. There is no reported Ashkenazi Jewish ancestry. There is no known consanguinity.    GENETIC COUNSELING ASSESSMENT: Carla Fuentes is a 65 y.o. female with a family history of ovarian and pancreatic cancer which is somewhat suggestive of a hereditary cancer syndrome and predisposition to cancer, and a brother who possibly had germline or somatic testing suggestive of a BRCA1/BRCA2 mutation. We, therefore, discussed and recommended the following at today's visit.   DISCUSSION: We discussed that approximately 5-10% of cancer is hereditary  Most cases of hereditary ovarian/pancreatic cancer are associated with BRCA1/BRCA2 genes, although there are other genes associated with hereditary cancer as well. We discussed these genes in detail since it is possible her brother may have had a mutation in one of these genes. We discussed that testing is beneficial for several reasons including knowing about cancer risks, identifying potential screening and risk-reduction options that may be appropriate, and to understand if other family members could be at risk for cancer and allow them to undergo genetic testing.   We reviewed the characteristics, features and inheritance patterns of hereditary cancer syndromes. We also discussed genetic testing, including the appropriate family members to test, the process of testing, insurance coverage  and turn-around-time for results. We discussed the implications of a negative, positive and/or variant of uncertain significant result. We recommended Ms. Freyre pursue genetic testing for the [facility] Multi-Cancer+RNA gene panel.   The Common Hereditary Cancers Panel + RNA offered by [facility] includes sequencing and/or deletion duplication testing of the following 47 genes: APC, ATM, AXIN2, BARD1, BMPR1A, BRCA1, BRCA2, BRIP1, CDH1, CDKN2A (p14ARF), CDKN2A (p16INK4a), CKD4, CHEK2, CTNNA1, DICER1, EPCAM (Deletion/duplication testing only), GREM1 (promoter region deletion/duplication testing only), KIT, MEN1, MLH1, MSH2, MSH3, MSH6, MUTYH, NBN, NF1, NHTL1, PALB2, PDGFRA, PMS2, POLD1, POLE, PTEN, RAD50, RAD51C, RAD51D, SDHB, SDHC, SDHD, SMAD4, SMARCA4. STK11, TP53, TSC1, TSC2, and VHL.  The following genes were evaluated for sequence changes only: SDHA and HOXB13 c.251G>A variant only.  Based on Ms. Panozzo family history of cancer, she meets medical criteria for genetic testing. Despite that she meets criteria, she may still have an out of pocket cost. We discussed that if her out of pocket cost for testing is over $100, the laboratory will call and confirm whether she wants to proceed with testing.  If the out of pocket cost of testing is less than $100 she will be billed by the genetic testing laboratory.   PLAN: After considering the risks, benefits, and limitations, Ms. Eckert provided informed consent to pursue genetic testing and the blood sample was sent to [facility] for analysis of the Multi-Cancer Panel +RNA. Results should be available within approximately 2-3 weeks' time, at which point they will be disclosed by telephone to Ms. Porche, as will any additional recommendations warranted by these results. Ms. Keiper will receive a summary of her genetic counseling visit and a copy of her results once available. This information will also be available in Epic.   Ms. Bogie questions were  answered to her satisfaction today. Our contact information was provided should additional questions or concerns arise. Thank you for the referral and allowing [place] to share in the care of your patient.   Faith Rogue, MS, [facility] Genetic Counselor [email or web address] Phone: (  [phone]  The patient was seen for a total of 60 minutes in face-to-face genetic counseling. [facility] intern [place] [employer] was also present and assisted.  Dr. Grayland Ormond was available for discussion regarding this case.   _______________________________________________________________________ For Office Staff:  Number of people involved in session: 2 Was an Intern/ student involved with case: yes

## 2020-08-24 DIAGNOSIS — F330 Major depressive disorder, recurrent, mild: Secondary | ICD-10-CM | POA: Diagnosis not present

## 2020-08-24 DIAGNOSIS — M1990 Unspecified osteoarthritis, unspecified site: Secondary | ICD-10-CM | POA: Diagnosis not present

## 2020-08-24 DIAGNOSIS — R2242 Localized swelling, mass and lump, left lower limb: Secondary | ICD-10-CM | POA: Diagnosis not present

## 2020-08-24 DIAGNOSIS — R5382 Chronic fatigue, unspecified: Secondary | ICD-10-CM | POA: Diagnosis not present

## 2020-08-24 DIAGNOSIS — J329 Chronic sinusitis, unspecified: Secondary | ICD-10-CM | POA: Diagnosis not present

## 2020-08-24 DIAGNOSIS — J0100 Acute maxillary sinusitis, unspecified: Secondary | ICD-10-CM | POA: Diagnosis not present

## 2020-08-24 DIAGNOSIS — N390 Urinary tract infection, site not specified: Secondary | ICD-10-CM | POA: Diagnosis not present

## 2020-08-26 DIAGNOSIS — R2242 Localized swelling, mass and lump, left lower limb: Secondary | ICD-10-CM | POA: Diagnosis not present

## 2020-08-26 DIAGNOSIS — M25562 Pain in left knee: Secondary | ICD-10-CM | POA: Diagnosis not present

## 2020-08-26 DIAGNOSIS — G8929 Other chronic pain: Secondary | ICD-10-CM | POA: Diagnosis not present

## 2020-08-26 DIAGNOSIS — M25561 Pain in right knee: Secondary | ICD-10-CM | POA: Diagnosis not present

## 2020-08-29 ENCOUNTER — Telehealth: Payer: Self-pay | Admitting: Licensed Clinical Social Worker

## 2020-08-29 NOTE — Telephone Encounter (Signed)
Revealed positive genetic testing. 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 Discussed this briefly, scheduled patient to see me tomorrow at 2 pm to discuss in detail.

## 2020-08-30 ENCOUNTER — Inpatient Hospital Stay: Payer: PPO | Admitting: Licensed Clinical Social Worker

## 2020-08-30 DIAGNOSIS — Z1379 Encounter for other screening for genetic and chromosomal anomalies: Secondary | ICD-10-CM

## 2020-08-30 DIAGNOSIS — Z1509 Genetic susceptibility to other malignant neoplasm: Secondary | ICD-10-CM | POA: Insufficient documentation

## 2020-08-30 DIAGNOSIS — Z808 Family history of malignant neoplasm of other organs or systems: Secondary | ICD-10-CM

## 2020-08-30 DIAGNOSIS — Z800 Family history of malignant neoplasm of digestive organs: Secondary | ICD-10-CM

## 2020-08-30 DIAGNOSIS — Z801 Family history of malignant neoplasm of trachea, bronchus and lung: Secondary | ICD-10-CM

## 2020-08-30 DIAGNOSIS — Z8041 Family history of malignant neoplasm of ovary: Secondary | ICD-10-CM

## 2020-08-30 DIAGNOSIS — Z1501 Genetic susceptibility to malignant neoplasm of breast: Secondary | ICD-10-CM | POA: Insufficient documentation

## 2020-08-30 NOTE — Progress Notes (Signed)
Genetic Test Results  HPI:  Carla Fuentes was previously seen in the [place] clinic due to a family history of cancer and concerns regarding a hereditary predisposition to cancer. Please refer to our prior cancer genetics clinic note for more information regarding our discussion, assessment and recommendations, at the time. Carla Fuentes recent genetic test results were disclosed to her, as were recommendations warranted by these results. These results and recommendations are discussed in more detail below.  CANCER HISTORY:  Oncology History   No history exists.    FAMILY HISTORY:  We obtained a detailed, 4-generation family history.  Significant diagnoses are listed below: Family History  Problem Relation Age of Onset  . Diabetes Mother   . Hypertension Mother   . Heart disease Father   . Diabetes Brother   . Stomach cancer Brother   . Ovarian cancer Paternal Aunt   . Cancer Paternal Uncle        unk type  . Cervical cancer Paternal Aunt   . Pancreatic cancer Cousin        d. 13s  . Cancer Cousin        blood cancer  . Other Cousin        d. brain tumor 30s/40s  . Lung cancer Cousin        x2; hx smoking  . Cancer Cousin        metastatic  . Thyroid cancer Niece        dx 72s, neg. GT   Carla Fuentes has a son and a daughter, no cancers. She had 1 full sister, 1 full brother and 4 maternal half brothers. Her full brother recently passed from stomach cancer. He had told Carla Fuentes and his children to have genetic testing as he had a BRCA mutation. Carla Fuentes was unable to obtain a copy of a report, so we reached out to several genetic testing labs and to the [place] [facility] to speak with a genetic counselor there. She confirmed he was seen in their clinic but did not have hereditary testing through them, and he also did not have any reports at the labs we called [facility], [facility] and [facility]). While we do not know for certain, it seems he did not have  hereditary cancer testing. It is possible that he had somatic testing that was suggestive of a BRCA1/BRCA2 germline mutation, but we cannot confirm this. She also mentioned her brother may have also had prostate cancer and melanoma. Additionally, patient's sister reportedly has HCM.   Carla Fuentes mother died at 42, no cancers. Patient has limited information about maternal relatives. An aunt passed in her 29s due to cirrhosis of the liver. No known cancers on this side of the family.  Carla Fuentes father died at 25 of a heart attack. Patient had 1 paternal uncle and 5 paternal aunts. The uncle did have cancer but unsure what type. His son had a blood cancer. A paternal aunt had cervical cancer and her daughter died of a brain tumor in her 47s. Another aunt had ovarian cancer in her 57s and died in her 29s. A first cousin had pancreatic cancer and passed in her 80s, and her daughter currently has metastatic cancer. Two other second cousins have had cancer, both lung, both had history of smoking.  Carla Fuentes is unaware of previous family history of genetic testing for hereditary cancer risks. Patient's maternal ancestors are of English descent, and paternal ancestors are of Scottish/Irish descent.  There is no reported Ashkenazi Jewish ancestry. There is no known consanguinity.    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 The remainder of testing was negative/normal.   The Multi-Cancer Panel + RNA offered by [facility] includes sequencing and/or deletion duplication testing of the following 84 genes: AIP, ALK, APC, ATM, AXIN2,BAP1,  BARD1, BLM, BMPR1A, BRCA1, BRCA2, BRIP1, CASR, CDC73, CDH1, CDK4, CDKN1B, CDKN1C, CDKN2A (p14ARF), CDKN2A (p16INK4a), CEBPA, CHEK2, CTNNA1, DICER1, DIS3L2, EGFR (c.2369C>T, p.Thr790Met variant only), EPCAM (Deletion/duplication testing only), FH, FLCN,  GATA2, GPC3, GREM1 (Promoter region deletion/duplication testing only), HOXB13 (c.251G>A, p.Gly84Glu), HRAS, KIT, MAX, MEN1, MET, MITF (c.952G>A, p.Glu318Lys variant only), MLH1, MSH2, MSH3, MSH6, MUTYH, NBN, NF1, NF2, NTHL1, PALB2, PDGFRA, PHOX2B, PMS2, POLD1, POLE, POT1, PRKAR1A, PTCH1, PTEN, RAD50, RAD51C, RAD51D, RB1, RECQL4, RET, RUNX1, SDHAF2, SDHA (sequence changes only), SDHB, SDHC, SDHD, SMAD4, SMARCA4, SMARCB1, SMARCE1, STK11, SUFU, TERC, TERT, TMEM127, TP53, TSC1, TSC2, VHL, WRN and WT1.  The test report has been scanned into EPIC and is located under the Molecular Pathology section of the Results Review tab.  A portion of the result report is included below for reference.      DISCUSSION: CHEK2   We discussed the cancers, inheritance, management asscociated with CHEK2, and the importance of telling family members about this result.   Clinical condition The CHEK2 gene is associated with an increased risk for autosomal dominant adult-onset cancers, including breast, colon, thyroid, prostate, and possibly others (PMID: 58309407, 68088110, 31594585, 92924462, 86381771). The risks of these cancers, particularly breast, have been determined to be both variant- and family history-dependent (PMID: 16579038, 33383291).  Lifetime risks for female breast cancer related to frameshift variants, such as 1100delC, have been estimated to be 25-39% in heterozygotes (PMID: 91660600, 45997741). The risks for most missense variants are unclear (such as the variant found in Ms. Doetsch), but risks for certain variants (such as p.Ile157Thr) are thought to be lower (PMID: 42395320, 23343568). Per NCCN, absolute risk for breast cancer is 15-40%.   Gene information The CHEK2 gene encodes the CHEK2 enzyme, which helps to ensure accurate DNA repair. In this role, CHEK2 acts as a tumor suppressor by promoting stability of the genome and by preventing tumor formation. The CHEK2 gene is associated with the DNA damage  repair response involving the Fanconi anemia-BRCA pathway (PMID: 61683729). A pathogenic variant that disrupts the function of CHEK2 decreases the ability of the cell to protect the integrity of the DNA (PMID: 02111552).  Inheritance Hereditary predisposition to cancer due to pathogenic variants in the CHEK2 gene has autosomal dominant inheritance. This means that an individual with a pathogenic variant has a 50% chance of passing the condition on to his/her offspring. Most cases are inherited from a parent, but some cases may occur spontaneously (i.e., an individual with a pathogenic variant has parents who do not have it). Identification of a pathogenic variant allows for the recognition of at-risk relatives who can pursue testing for the familial variant.  Management:    Breast cancer -Annual mammogram with consideration of breast MRI starting at age 15 -Evidence is insufficient for RRM, manage based on family history  Carla Fuentes would like to be referred to the high risk breast clinic here at the [place] to discuss breast cancer risk management.   Colon Cancer -For individuals unaffected by colorectal cancer with a first degree relative with colorectal cancer, begin colonoscopy at age 53 or  10 years younger than relative's age at diagnosis and repeat every 5 years -For individuals unaffected by colorectal cancer and no family history of colorectal cancer, begin colonoscopy at age 59 and repeat every 5 years  Carla Fuentes has her colonoscopies through [facility]. We will copy this note to that practice.   These guidelines are based on current NCCN guidelines (NCCN v.2.2021).  These guidelines are subject to change and continually updated and should be directly referenced for future medical management.     It has been suggested that men with a CHEK2 pathogenic variant and a first-degree relative with prostate cancer have an annual prostate-specific antigen (PSA) analysis (PMID:  39532023). However, the benefits of screening for prostate cancer among men with a pathogenic variant in CHEK2 are uncertain (PMID: 34356861).  An individual's cancer risk and medical management are not determined by genetic test results alone. Overall cancer risk assessment incorporates additional factors, including personal medical history, family history, and any available genetic information that may result in a personalized plan for cancer prevention and surveillance.  Knowing if a pathogenic CHEK2 variant is present is advantageous. At-risk relatives can be identified, enabling pursuit of a diagnostic evaluation. Information regarding hereditary cancer susceptibility genes is constantly evolving, and more clinically relevant data regarding CHEK2 is likely to become available in the near future. Awareness of this cancer predisposition encourages patients and their providers to inform at-risk family members, to diligently follow condition-specific screening protocols, and to be vigilant in maintaining close and regular contact with their local genetics clinic in anticipation of new information.   FAMILY MEMBERS: It is important that all of Carla Fuentes relatives (both men and women) know of the presence of this gene mutation. Site-specific genetic testing can sort out who in the family is at risk and who is not.   Carla Fuentes children and siblings have a 50% chance to have inherited this mutation. We recommend they have genetic testing for this same mutation, as identifying the presence of this mutation would allow them to also take advantage of risk-reducing measures.   PLAN:   1. These results will be made available to her PCP, Dr. Sabra Heck and her GYN, Dr. Sabra Heck as well as [facility] GI practice. We will also refer her to the high risk breast clinic. She would like her PCP/GYN/High risk breast clinic to follow her long-term for this indication and coordinate screening/prophylactic surgeries.     2. Carla Fuentes plans to discuss these results with her family and will reach out to [place] if we can be of any assistance in coordinating genetic testing for any of her relatives.    SUPPORT AND RESOURCES: If Carla Fuentes is interested in CHEK2-specific information and support, there are two groups, Facing Our Risk (www.facingourrisk.com) and Bright Pink (www.brightpink.org) which some people have found useful. They provide opportunities to speak with other individuals from high-risk families. To locate genetic counselors in other cities, visit the website of the [employer] (ArtistMovie.se) and Secretary/administrator for a Social worker by zip code.  We encouraged Carla Fuentes to remain in contact with [place] on an annual basis so we can update her personal and family histories, and let her know of advances in cancer genetics that may benefit the family. Our contact number was provided. Carla Fuentes questions were answered to her satisfaction today, and she knows she is welcome to call anytime with additional questions.   Faith Rogue, MS, [facility] Genetic Counselor [email or web address] Phone: [phone]

## 2020-08-31 ENCOUNTER — Other Ambulatory Visit (HOSPITAL_BASED_OUTPATIENT_CLINIC_OR_DEPARTMENT_OTHER): Payer: Self-pay | Admitting: Obstetrics & Gynecology

## 2020-08-31 ENCOUNTER — Encounter (HOSPITAL_BASED_OUTPATIENT_CLINIC_OR_DEPARTMENT_OTHER): Payer: Self-pay | Admitting: Obstetrics & Gynecology

## 2020-08-31 ENCOUNTER — Encounter (HOSPITAL_BASED_OUTPATIENT_CLINIC_OR_DEPARTMENT_OTHER): Payer: Self-pay

## 2020-09-06 ENCOUNTER — Encounter (HOSPITAL_BASED_OUTPATIENT_CLINIC_OR_DEPARTMENT_OTHER): Payer: Self-pay

## 2020-09-08 ENCOUNTER — Inpatient Hospital Stay: Payer: PPO | Attending: Oncology | Admitting: Oncology

## 2020-09-08 ENCOUNTER — Encounter: Payer: Self-pay | Admitting: Oncology

## 2020-09-08 VITALS — BP 117/85 | HR 91 | Temp 99.0°F | Resp 18

## 2020-09-08 DIAGNOSIS — Z8041 Family history of malignant neoplasm of ovary: Secondary | ICD-10-CM

## 2020-09-08 DIAGNOSIS — Z801 Family history of malignant neoplasm of trachea, bronchus and lung: Secondary | ICD-10-CM | POA: Diagnosis not present

## 2020-09-08 DIAGNOSIS — Z1589 Genetic susceptibility to other disease: Secondary | ICD-10-CM

## 2020-09-08 DIAGNOSIS — Z1502 Genetic susceptibility to malignant neoplasm of ovary: Secondary | ICD-10-CM

## 2020-09-08 DIAGNOSIS — Z1509 Genetic susceptibility to other malignant neoplasm: Secondary | ICD-10-CM

## 2020-09-08 DIAGNOSIS — Z1501 Genetic susceptibility to malignant neoplasm of breast: Secondary | ICD-10-CM | POA: Diagnosis not present

## 2020-09-08 DIAGNOSIS — Z7183 Encounter for nonprocreative genetic counseling: Secondary | ICD-10-CM

## 2020-09-08 DIAGNOSIS — Z808 Family history of malignant neoplasm of other organs or systems: Secondary | ICD-10-CM | POA: Diagnosis not present

## 2020-09-08 DIAGNOSIS — Z800 Family history of malignant neoplasm of digestive organs: Secondary | ICD-10-CM | POA: Diagnosis not present

## 2020-09-08 NOTE — Progress Notes (Signed)
New patient evaluation today.

## 2020-09-08 NOTE — Progress Notes (Signed)
Hematology/Oncology Consult note [facility] Telephone:([phone] [phone]   Patient Care Team: Rusty Aus, MD as PCP - General (Internal Medicine)  REFERRING PROVIDER: Rusty Aus, MD  CHIEF COMPLAINTS/REASON FOR VISIT:  High risk for development of breast cancer.  HISTORY OF PRESENTING ILLNESS:   Carla Fuentes is a  65 y.o.  female with PMH listed below was seen in consultation at the request of  Rusty Aus, MD  for evaluation of high risk for development of breast cancer  Patient reports that her brother has been sick for the past few years due to unintentional weight loss and was eventually diagnosed with gastric cancer and passed away.  Brother told patient " to check breast cancer gene". She mentioned to gynecologist and was referred to genetic counselor for testing.    08/10/2020, patient had a genetic testing was tested positive for heterozygous CHEK2 Mother was adopted. Family history is positive for brother with stomach cancer, paternal aunt ovarian cancer, another paternal aunt cervical cancer, paternal uncle cancer with unknown details, paternal cousin pancreatic cancer, another cousin with blood cancer, second paternal cousin lung cancer, niece thyroid cancer  Patient reports not feeling well since the death of her brother.  She has had frequent sinus infection and UTIs.  Review of Systems  Constitutional: Negative for appetite change, chills, fatigue and fever.  HENT:   Negative for hearing loss and voice change.   Eyes: Negative for eye problems.  Respiratory: Negative for chest tightness and cough.   Cardiovascular: Negative for chest pain.  Gastrointestinal: Negative for abdominal distention, abdominal pain and blood in stool.  Endocrine: Negative for hot flashes.  Genitourinary: Negative for difficulty urinating and frequency.   Musculoskeletal: Negative for arthralgias.  Skin: Negative for itching and rash.   Neurological: Negative for extremity weakness.  Hematological: Negative for adenopathy.  Psychiatric/Behavioral: Negative for confusion.    MEDICAL HISTORY:  Past Medical History:  Diagnosis Date  . Abnormal Pap smear   . Anxiety   . Basal cell adenocarcinoma    skin, face  . Colitis 01/05/15   treated wtih antibiotics  . Depression   . Family history of lung cancer   . Family history of ovarian cancer   . Family history of pancreatic cancer   . Family history of stomach cancer   . Family history of thyroid cancer   . GERD (gastroesophageal reflux disease)   . Hypertension    started on medication for LEE  . Positive test for genetic breast cancer susceptibility marker    CHEK2+ gene  . TIA (transient ischemic attack)    initially question as TIA, evaluation negative.  Neurologist felt this was anxiety related.    SURGICAL HISTORY: Past Surgical History:  Procedure Laterality Date  . CERVICAL BIOPSY  W/ LOOP ELECTRODE EXCISION    . CESAREAN SECTION     2, FTP with fetal distress  . COLONOSCOPY  01/2015   repeat 5 years  . COLONOSCOPY WITH PROPOFOL N/A 05/30/2020   Procedure: COLONOSCOPY WITH PROPOFOL;  Surgeon: Benjamine Sprague, DO;  Location: [facility] ENDOSCOPY;  Service: General;  Laterality: N/A;  . ESOPHAGOGASTRODUODENOSCOPY N/A 05/30/2020   Procedure: ESOPHAGOGASTRODUODENOSCOPY (EGD);  Surgeon: Benjamine Sprague, DO;  Location: [facility] ENDOSCOPY;  Service: General;  Laterality: N/A;  . HEMORRHOID BANDING  11/2014   Dr. Tamala Julian, [place]  . OOPHORECTOMY  2001   for recurrent ovarian cyst    SOCIAL HISTORY: Social History   Socioeconomic History  . Marital  status: Married    Spouse name: Not on file  . Number of children: Not on file  . Years of education: Not on file  . Highest education level: Not on file  Occupational History    Employer: SELF EMPLOYED    Comment: [employer]  Tobacco Use  . Smoking status: Never Smoker  . Smokeless tobacco: Never Used  Vaping  Use  . Vaping Use: Never used  Substance and Sexual Activity  . Alcohol use: Yes    Alcohol/week: 0.0 - 1.0 standard drinks  . Drug use: No  . Sexual activity: Yes    Partners: Male    Birth control/protection: Post-menopausal    Comment: husband vasectomy  Other Topics Concern  . Not on file  Social History Narrative   Lives with husband, remarried. 2 children, 33YO and 35YO, one child lives [place].      Work - Public librarian      Diet - regular diet      Exercise - works out 3-4 times per week   Social Determinants of Radio broadcast assistant Strain: Not on [employer] Insecurity: Not on file  Transportation Needs: Not on file  Physical Activity: Not on file  Stress: Not on file  Social Connections: Not on file  Intimate Partner Violence: Not on file    FAMILY HISTORY: Family History  Problem Relation Age of Onset  . Diabetes Mother   . Hypertension Mother   . Heart disease Father   . Diabetes Brother   . Stomach cancer Brother   . Ovarian cancer Paternal Aunt   . Cancer Paternal Uncle        unk type  . Cervical cancer Paternal Aunt   . Pancreatic cancer Cousin        d. 32s  . Cancer Cousin        blood cancer  . Other Cousin        d. brain tumor 30s/40s  . Lung cancer Cousin        x2; hx smoking  . Cancer Cousin        metastatic  . Thyroid cancer Niece        dx 20s, neg. GT    ALLERGIES:  is allergic to penicillins.  MEDICATIONS:  Current Outpatient Medications  Medication Sig Dispense Refill  . ALPRAZolam (XANAX) 0.25 MG tablet Take 1 tablet (0.25 mg total) by mouth 3 (three) times daily as needed for sleep or anxiety (Take 1 tablet by mouth every 8 hours as needed for anxiety). 30 tablet 1  . CRANBERRY PO Take by mouth.    . furosemide (LASIX) 20 MG tablet Take 20 mg by mouth daily.     Marland Kitchen MAGNESIUM PO Take by mouth daily.    . Multiple Vitamin (MULTIVITAMIN) capsule Take 1 capsule by mouth daily.    . potassium chloride  (MICRO-K) 10 MEQ CR capsule Take by mouth.    . temazepam (RESTORIL) 30 MG capsule TAKE 1 CAPSULE AT BEDTIME AS NEEDED INSOMNIA 30 capsule 5  . TURMERIC PO Take by mouth.    . venlafaxine XR (EFFEXOR-XR) 37.5 MG 24 hr capsule Take by mouth.    . estradiol (ESTRACE) 0.1 MG/GM vaginal cream 1 gram vaginally twice weekly (Patient not taking: No sig reported) 42.5 g 6  . meloxicam (MOBIC) 15 MG tablet Take 1 tablet (15 mg total) by mouth daily. (Patient not taking: No sig reported) 30 tablet 3  .  progesterone (PROMETRIUM) 100 MG capsule Take 100 mg by mouth daily. (Patient not taking: Reported on 09/08/2020)    . sertraline (ZOLOFT) 25 MG tablet TAKE 3 TABLETS BY MOUTH DAILY (Patient not taking: Reported on 09/08/2020) 270 tablet 0   No current facility-administered medications for this visit.     PHYSICAL EXAMINATION: ECOG PERFORMANCE STATUS: 0 - Asymptomatic Vitals:   09/08/20 1351  BP: 117/85  Pulse: 91  Resp: 18  Temp: 99 F (37.2 C)   Filed Weights    Physical Exam Constitutional:      General: She is not in acute distress. HENT:     Head: Normocephalic and atraumatic.  Eyes:     General: No scleral icterus. Cardiovascular:     Rate and Rhythm: Normal rate and regular rhythm.     Heart sounds: Normal heart sounds.  Pulmonary:     Effort: Pulmonary effort is normal. No respiratory distress.     Breath sounds: No wheezing.  Abdominal:     General: Bowel sounds are normal. There is no distension.     Palpations: Abdomen is soft.  Musculoskeletal:        General: No deformity. Normal range of motion.     Cervical back: Normal range of motion and neck supple.  Skin:    General: Skin is warm and dry.     Findings: No erythema or rash.  Neurological:     Mental Status: She is alert and oriented to person, place, and time. Mental status is at baseline.     Cranial Nerves: No cranial nerve deficit.     Coordination: Coordination normal.  Psychiatric:     Comments: Tearful      LABORATORY DATA:  I have reviewed the data as listed Lab Results  Component Value Date   WBC 11.6 (H) 01/05/2015   HGB 13.7 01/05/2015   HCT 42.0 01/05/2015   MCV 94.1 01/05/2015   PLT 207 01/05/2015   No results for input(s): NA, K, CL, CO2, GLUCOSE, BUN, CREATININE, CALCIUM, GFRNONAA, GFRAA, PROT, ALBUMIN, AST, ALT, ALKPHOS, BILITOT, BILIDIR, IBILI in the last 8760 hours. Iron/TIBC/Ferritin/ %Sat No results found for: IRON, TIBC, FERRITIN, IRONPCTSAT    RADIOGRAPHIC STUDIES: I have personally reviewed the radiological images as listed and agreed with the findings in the report. No results found.    ASSESSMENT & PLAN:  1. Monoallelic mutation of CHEK2 gene in female patient   2. Family history of stomach cancer   3. Family history of ovarian cancer   4. Family history of pancreatic cancer   5. Family history of lung cancer   6. Family history of thyroid cancer    CHEK2 gene in female patient NCCN guideline recommendation was reviewed with patient. Recommend Annual mammogram with annual breast MRI Evidence is insufficient for risk reduction modality.  Patient has started colon cancer screening. Recommend patient's children and siblings to be tested. Orders Placed This Encounter  Procedures  . MM DIAG BREAST TOMO BILATERAL    Standing Status:   Future    Standing Expiration Date:   09/08/2021    Order Specific Question:   Reason for Exam (SYMPTOM  OR DIAGNOSIS REQUIRED)    Answer:   [id number]+, strong family history of cancer, high risk breast cancer    Order Specific Question:   Preferred imaging location?    Answer:   [facility]  . US Breast Limited [facility]    Standing Status:   Future    Standing  Expiration Date:   09/08/2021    Order Specific Question:   Reason for Exam (SYMPTOM  OR DIAGNOSIS REQUIRED)    Answer:   LEXN1+, strong family history of cancer, high risk breast cancer    Order Specific Question:   Preferred imaging location?     Answer:   [facility]  . US Breast Limited [facility]    Standing Status:   Future    Standing Expiration Date:   09/08/2021    Order Specific Question:   Reason for Exam (SYMPTOM  OR DIAGNOSIS REQUIRED)    Answer:   ZGYF7+, strong family history of cancer, high risk breast cancer    Order Specific Question:   Preferred imaging location?    Answer:   [facility]    All questions were answered. The patient knows to call the clinic with any problems questions or concerns.   Rusty Aus, MD    Return of visit: 1 year Thank you for this kind referral and the opportunity to participate in the care of this patient. A copy of today's note is routed to referring provider    Earlie Server, MD, PhD Hematology Oncology [facility] at [facility] Pager- [phone] 09/08/2020

## 2020-09-09 ENCOUNTER — Inpatient Hospital Stay
Admission: RE | Admit: 2020-09-09 | Discharge: 2020-09-09 | Disposition: A | Discharge status: Home / Self Care | Payer: Self-pay | Source: Ambulatory Visit | Attending: *Deleted | Admitting: *Deleted

## 2020-09-09 ENCOUNTER — Other Ambulatory Visit: Payer: Self-pay | Admitting: *Deleted

## 2020-09-09 DIAGNOSIS — Z1231 Encounter for screening mammogram for malignant neoplasm of breast: Secondary | ICD-10-CM

## 2020-09-12 ENCOUNTER — Telehealth: Payer: Self-pay | Admitting: *Deleted

## 2020-09-12 DIAGNOSIS — Z1509 Genetic susceptibility to other malignant neoplasm: Secondary | ICD-10-CM

## 2020-09-12 DIAGNOSIS — Z1501 Genetic susceptibility to malignant neoplasm of breast: Secondary | ICD-10-CM

## 2020-09-12 NOTE — Telephone Encounter (Signed)
Yes

## 2020-09-12 NOTE — Telephone Encounter (Signed)
Patient called stating that [facility] called her to schedule her annual mammogram for November/. She tried to tell the that per a visit with Dt Tasia Catchings Friday, she is to have every 6 month monitoring with diagnostic mammogram alternating with MRI due to carrying the breast cancer gene. She states that [facility] said they do not have an order for this and would schedule a 6 month mammogram or MRI without an order

## 2020-09-12 NOTE — Telephone Encounter (Signed)
The check out note with Dr. Tasia Catchings on 5/5 does have diagnostic mammo to be scheduled and there is an order available.  Carla Fuentes, were you able to get the diagnostic mammogram scheduled?

## 2020-09-12 NOTE — Telephone Encounter (Signed)
Per mammo they have received the images from [facility] and they are stating unless she is having an issue right now, they will not do an diagnostic mammo. She only needs a screen. Please can someone call and explain this to pt for she is expecting a call about a diagnostic imaging being done.

## 2020-09-13 NOTE — Addendum Note (Signed)
Addended by: Vanice Sarah on: 09/13/2020 12:57 PM   Modules accepted: Orders

## 2020-09-13 NOTE — Telephone Encounter (Signed)
Message received from Dr.  Tasia Catchings:  talked to radiology Dr.Brown.  Let's adjust her plan to do MRI breast bilaterally now.When she is due for her mammogram in Nov 2021, it will be screening mammogram if MRI is negative.  I talked to her about the possibility of MRI and please let her know about this update and schedule.

## 2020-09-13 NOTE — Telephone Encounter (Signed)
Per last checkout on 09/08/20: MD in 1 year. Can you please schedule. Thanks

## 2020-09-22 ENCOUNTER — Ambulatory Visit: Payer: PPO

## 2020-09-26 ENCOUNTER — Other Ambulatory Visit: Payer: Self-pay

## 2020-09-26 ENCOUNTER — Ambulatory Visit
Admission: RE | Admit: 2020-09-26 | Discharge: 2020-09-26 | Disposition: A | Discharge status: Home / Self Care | Payer: PPO | Source: Ambulatory Visit | Attending: Oncology | Admitting: Oncology

## 2020-09-26 DIAGNOSIS — Z1501 Genetic susceptibility to malignant neoplasm of breast: Secondary | ICD-10-CM | POA: Insufficient documentation

## 2020-09-26 DIAGNOSIS — Z1509 Genetic susceptibility to other malignant neoplasm: Secondary | ICD-10-CM | POA: Insufficient documentation

## 2020-09-26 DIAGNOSIS — Z1589 Genetic susceptibility to other disease: Secondary | ICD-10-CM | POA: Insufficient documentation

## 2020-09-26 DIAGNOSIS — Z1502 Genetic susceptibility to malignant neoplasm of ovary: Secondary | ICD-10-CM | POA: Insufficient documentation

## 2020-09-26 DIAGNOSIS — N6489 Other specified disorders of breast: Secondary | ICD-10-CM | POA: Diagnosis not present

## 2020-09-26 MED ORDER — GADOBUTROL 1 MMOL/ML IV SOLN
7.5000 mL | Freq: Once | INTRAVENOUS | Status: AC | PRN
  Administered 2020-09-26: 7.5 mL via INTRAVENOUS

## 2020-09-28 ENCOUNTER — Telehealth: Payer: Self-pay

## 2020-09-28 DIAGNOSIS — Z1231 Encounter for screening mammogram for malignant neoplasm of breast: Secondary | ICD-10-CM

## 2020-09-28 NOTE — Telephone Encounter (Signed)
-----   Message from Earlie Server, MD sent at 09/27/2020 10:17 PM EDT ----- Please arrange her to get b/l screening mammogram in nov 2022.

## 2020-09-28 NOTE — Telephone Encounter (Signed)
Please schedule as MD recommends and inform pt of appt details.

## 2020-10-27 ENCOUNTER — Encounter: Payer: Self-pay | Admitting: Licensed Clinical Social Worker

## 2020-10-28 ENCOUNTER — Other Ambulatory Visit (HOSPITAL_COMMUNITY): Payer: Self-pay | Admitting: Internal Medicine

## 2020-10-28 ENCOUNTER — Other Ambulatory Visit: Payer: Self-pay | Admitting: Internal Medicine

## 2020-10-28 DIAGNOSIS — R1032 Left lower quadrant pain: Secondary | ICD-10-CM

## 2020-10-28 DIAGNOSIS — M79651 Pain in right thigh: Secondary | ICD-10-CM | POA: Diagnosis not present

## 2020-10-28 DIAGNOSIS — R1031 Right lower quadrant pain: Secondary | ICD-10-CM

## 2020-11-21 ENCOUNTER — Other Ambulatory Visit: Payer: Self-pay

## 2020-11-21 ENCOUNTER — Ambulatory Visit
Admission: RE | Admit: 2020-11-21 | Discharge: 2020-11-21 | Disposition: A | Discharge status: Home / Self Care | Payer: PPO | Source: Ambulatory Visit | Attending: Internal Medicine | Admitting: Internal Medicine

## 2020-11-21 DIAGNOSIS — K5730 Diverticulosis of large intestine without perforation or abscess without bleeding: Secondary | ICD-10-CM | POA: Diagnosis not present

## 2020-11-21 DIAGNOSIS — R634 Abnormal weight loss: Secondary | ICD-10-CM | POA: Diagnosis not present

## 2020-11-21 DIAGNOSIS — R1032 Left lower quadrant pain: Secondary | ICD-10-CM | POA: Insufficient documentation

## 2020-11-21 DIAGNOSIS — I878 Other specified disorders of veins: Secondary | ICD-10-CM | POA: Diagnosis not present

## 2020-11-21 DIAGNOSIS — M79651 Pain in right thigh: Secondary | ICD-10-CM | POA: Insufficient documentation

## 2020-11-21 DIAGNOSIS — R1031 Right lower quadrant pain: Secondary | ICD-10-CM

## 2020-11-21 DIAGNOSIS — N2889 Other specified disorders of kidney and ureter: Secondary | ICD-10-CM | POA: Diagnosis not present

## 2020-11-23 ENCOUNTER — Encounter (HOSPITAL_BASED_OUTPATIENT_CLINIC_OR_DEPARTMENT_OTHER): Payer: Self-pay

## 2020-11-23 ENCOUNTER — Other Ambulatory Visit: Payer: Self-pay | Admitting: Internal Medicine

## 2020-11-23 DIAGNOSIS — N2889 Other specified disorders of kidney and ureter: Secondary | ICD-10-CM

## 2020-11-24 ENCOUNTER — Encounter: Payer: Self-pay | Admitting: Oncology

## 2020-11-24 NOTE — Telephone Encounter (Signed)
Dr. Bailen Geffre Sauer

## 2020-11-28 ENCOUNTER — Other Ambulatory Visit: Payer: Self-pay

## 2020-11-28 ENCOUNTER — Ambulatory Visit
Admission: RE | Admit: 2020-11-28 | Discharge: 2020-11-28 | Disposition: A | Discharge status: Home / Self Care | Payer: PPO | Source: Ambulatory Visit | Attending: Internal Medicine | Admitting: Internal Medicine

## 2020-11-28 DIAGNOSIS — N2889 Other specified disorders of kidney and ureter: Secondary | ICD-10-CM | POA: Diagnosis not present

## 2020-11-28 MED ORDER — GADOBUTROL 1 MMOL/ML IV SOLN
7.5000 mL | Freq: Once | INTRAVENOUS | Status: AC | PRN
  Administered 2020-11-28: 7.5 mL via INTRAVENOUS

## 2020-11-29 ENCOUNTER — Encounter: Payer: Self-pay | Admitting: Oncology

## 2020-11-30 NOTE — Telephone Encounter (Signed)
Please advise 

## 2020-12-05 DIAGNOSIS — C642 Malignant neoplasm of left kidney, except renal pelvis: Secondary | ICD-10-CM | POA: Insufficient documentation

## 2020-12-05 DIAGNOSIS — N2889 Other specified disorders of kidney and ureter: Secondary | ICD-10-CM | POA: Diagnosis not present

## 2020-12-08 DIAGNOSIS — C642 Malignant neoplasm of left kidney, except renal pelvis: Secondary | ICD-10-CM | POA: Diagnosis not present

## 2020-12-29 DIAGNOSIS — Z79899 Other long term (current) drug therapy: Secondary | ICD-10-CM | POA: Diagnosis not present

## 2020-12-29 DIAGNOSIS — R4181 Age-related cognitive decline: Secondary | ICD-10-CM | POA: Diagnosis not present

## 2020-12-29 DIAGNOSIS — I10 Essential (primary) hypertension: Secondary | ICD-10-CM | POA: Diagnosis not present

## 2020-12-29 DIAGNOSIS — Z01818 Encounter for other preprocedural examination: Secondary | ICD-10-CM | POA: Diagnosis not present

## 2020-12-29 DIAGNOSIS — K219 Gastro-esophageal reflux disease without esophagitis: Secondary | ICD-10-CM | POA: Diagnosis not present

## 2020-12-29 DIAGNOSIS — F330 Major depressive disorder, recurrent, mild: Secondary | ICD-10-CM | POA: Diagnosis not present

## 2020-12-29 DIAGNOSIS — R9431 Abnormal electrocardiogram [ECG] [EKG]: Secondary | ICD-10-CM | POA: Diagnosis not present

## 2020-12-29 DIAGNOSIS — Z90721 Acquired absence of ovaries, unilateral: Secondary | ICD-10-CM | POA: Diagnosis not present

## 2020-12-29 DIAGNOSIS — C642 Malignant neoplasm of left kidney, except renal pelvis: Secondary | ICD-10-CM | POA: Diagnosis not present

## 2020-12-29 DIAGNOSIS — K5909 Other constipation: Secondary | ICD-10-CM | POA: Diagnosis not present

## 2020-12-31 DIAGNOSIS — C642 Malignant neoplasm of left kidney, except renal pelvis: Secondary | ICD-10-CM | POA: Diagnosis not present

## 2021-01-05 DIAGNOSIS — Z79899 Other long term (current) drug therapy: Secondary | ICD-10-CM | POA: Diagnosis not present

## 2021-01-16 DIAGNOSIS — G8918 Other acute postprocedural pain: Secondary | ICD-10-CM | POA: Diagnosis not present

## 2021-01-16 DIAGNOSIS — C642 Malignant neoplasm of left kidney, except renal pelvis: Secondary | ICD-10-CM | POA: Diagnosis not present

## 2021-01-16 DIAGNOSIS — Z20822 Contact with and (suspected) exposure to covid-19: Secondary | ICD-10-CM | POA: Diagnosis not present

## 2021-01-16 DIAGNOSIS — Z8673 Personal history of transient ischemic attack (TIA), and cerebral infarction without residual deficits: Secondary | ICD-10-CM | POA: Diagnosis not present

## 2021-01-16 DIAGNOSIS — Z880 Allergy status to penicillin: Secondary | ICD-10-CM | POA: Diagnosis not present

## 2021-01-16 DIAGNOSIS — D3502 Benign neoplasm of left adrenal gland: Secondary | ICD-10-CM | POA: Diagnosis not present

## 2021-01-16 DIAGNOSIS — N2889 Other specified disorders of kidney and ureter: Secondary | ICD-10-CM | POA: Diagnosis not present

## 2021-01-16 DIAGNOSIS — Z85828 Personal history of other malignant neoplasm of skin: Secondary | ICD-10-CM | POA: Diagnosis not present

## 2021-01-16 DIAGNOSIS — I10 Essential (primary) hypertension: Secondary | ICD-10-CM | POA: Diagnosis not present

## 2021-01-18 ENCOUNTER — Other Ambulatory Visit: Payer: Self-pay

## 2021-01-18 HISTORY — PX: NEPHRECTOMY: SHX65

## 2021-01-27 DIAGNOSIS — C642 Malignant neoplasm of left kidney, except renal pelvis: Secondary | ICD-10-CM | POA: Diagnosis not present

## 2021-01-27 DIAGNOSIS — I10 Essential (primary) hypertension: Secondary | ICD-10-CM | POA: Diagnosis not present

## 2021-02-24 ENCOUNTER — Other Ambulatory Visit: Payer: Self-pay

## 2021-02-24 ENCOUNTER — Ambulatory Visit: Payer: PPO | Admitting: Podiatry

## 2021-02-24 ENCOUNTER — Ambulatory Visit (INDEPENDENT_AMBULATORY_CARE_PROVIDER_SITE_OTHER): Payer: PPO

## 2021-02-24 DIAGNOSIS — M778 Other enthesopathies, not elsewhere classified: Secondary | ICD-10-CM

## 2021-02-24 DIAGNOSIS — M79671 Pain in right foot: Secondary | ICD-10-CM

## 2021-02-28 DIAGNOSIS — R300 Dysuria: Secondary | ICD-10-CM | POA: Diagnosis not present

## 2021-03-09 DIAGNOSIS — N2889 Other specified disorders of kidney and ureter: Secondary | ICD-10-CM | POA: Diagnosis not present

## 2021-03-09 DIAGNOSIS — C642 Malignant neoplasm of left kidney, except renal pelvis: Secondary | ICD-10-CM | POA: Diagnosis not present

## 2021-03-12 DIAGNOSIS — M778 Other enthesopathies, not elsewhere classified: Secondary | ICD-10-CM | POA: Diagnosis not present

## 2021-03-12 MED ORDER — BETAMETHASONE SOD PHOS & ACET 6 (3-3) MG/ML IJ SUSP
3.0000 mg | Freq: Once | INTRAMUSCULAR | Status: AC
  Administered 2021-03-12: 3 mg via INTRA_ARTICULAR

## 2021-03-12 NOTE — Progress Notes (Signed)
   HPI: 65 y.o. female presenting today with a new complaint today regarding pain to the top of the right foot.  Patient states that she had renal surgery 5 weeks ago.  She needs to walk for recovery however she is experiencing significant pain and tenderness to the right foot.  She denies a history of injury.  She this is been ongoing for several weeks now.  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  Neurologist felt this was anxiety related.     Physical Exam: General: The patient is alert and oriented x3 in no acute distress.  Dermatology: Skin is warm, dry and supple bilateral lower extremities. Negative for open lesions or macerations.  Vascular: Palpable pedal pulses bilaterally. No edema or erythema noted. Capillary refill within normal limits.  Neurological: Epicritic and protective threshold grossly intact bilaterally.   Musculoskeletal Exam: No pedal deformity noted.  There is pain on palpation throughout the right midtarsal joint.  Pain with manipulation of the midtarsal joint as well  Radiographic Exam:  Normal osseous mineralization. Joint spaces preserved. No fracture/dislocation/boney destruction.    Assessment: 1.  Capsulitis right midfoot   Plan of Care:  1. Patient evaluated. X-Rays reviewed.  2.  Injection of 0.5 cc Celestone Soluspan injected in the  right midtarsal joint 3.  OTC power step insoles were provided.  Wear daily as needed 4.  Patient unable to take NSAIDs.  She had a kidney removed 5 weeks ago (cancer) 5.  Return to clinic as needed  *Fitness coach      Edrick Kins, DPM [facility]  Dr. Edrick Kins, DPM    [address], [place] [address]                Office [phone]  Fax [phone]

## 2021-03-15 DIAGNOSIS — H5203 Hypermetropia, bilateral: Secondary | ICD-10-CM | POA: Diagnosis not present

## 2021-03-15 DIAGNOSIS — H02839 Dermatochalasis of unspecified eye, unspecified eyelid: Secondary | ICD-10-CM | POA: Diagnosis not present

## 2021-03-15 DIAGNOSIS — H1045 Other chronic allergic conjunctivitis: Secondary | ICD-10-CM | POA: Diagnosis not present

## 2021-03-24 ENCOUNTER — Other Ambulatory Visit: Payer: Self-pay

## 2021-03-24 ENCOUNTER — Ambulatory Visit
Admission: RE | Admit: 2021-03-24 | Discharge: 2021-03-24 | Disposition: A | Discharge status: Home / Self Care | Payer: PPO | Source: Ambulatory Visit | Attending: Oncology | Admitting: Oncology

## 2021-03-24 DIAGNOSIS — Z1231 Encounter for screening mammogram for malignant neoplasm of breast: Secondary | ICD-10-CM | POA: Insufficient documentation

## 2021-03-28 DIAGNOSIS — B349 Viral infection, unspecified: Secondary | ICD-10-CM | POA: Diagnosis not present

## 2021-03-28 DIAGNOSIS — J45902 Unspecified asthma with status asthmaticus: Secondary | ICD-10-CM | POA: Diagnosis not present

## 2021-03-28 DIAGNOSIS — J40 Bronchitis, not specified as acute or chronic: Secondary | ICD-10-CM | POA: Diagnosis not present

## 2021-04-05 ENCOUNTER — Encounter: Payer: Self-pay | Admitting: Oncology

## 2021-04-07 NOTE — Telephone Encounter (Signed)
Any advise on this pt request?

## 2021-04-10 ENCOUNTER — Other Ambulatory Visit: Payer: Self-pay

## 2021-04-10 DIAGNOSIS — Z1589 Genetic susceptibility to other disease: Secondary | ICD-10-CM

## 2021-04-10 DIAGNOSIS — Z1501 Genetic susceptibility to malignant neoplasm of breast: Secondary | ICD-10-CM

## 2021-04-10 NOTE — Telephone Encounter (Signed)
Please schedule MRI breast in May and change MD follow up to be  a few days after MRI.

## 2021-04-12 ENCOUNTER — Telehealth: Payer: Self-pay | Admitting: Oncology

## 2021-04-12 NOTE — Telephone Encounter (Signed)
Spoke to pt and she is agreeable to schedule appt for further discussion. Pt to be scheduled at the end of Dec.

## 2021-04-12 NOTE — Telephone Encounter (Signed)
I called pt to update her on her appts. She stated she wanted to ask if she should get a 3 month scan on her kidney rather than a 6 month scan. Would like a nurse or MD to call back and discuss.

## 2021-04-20 ENCOUNTER — Ambulatory Visit (HOSPITAL_BASED_OUTPATIENT_CLINIC_OR_DEPARTMENT_OTHER): Payer: PPO | Admitting: Obstetrics & Gynecology

## 2021-04-25 DIAGNOSIS — R0789 Other chest pain: Secondary | ICD-10-CM | POA: Diagnosis not present

## 2021-04-25 DIAGNOSIS — Z0000 Encounter for general adult medical examination without abnormal findings: Secondary | ICD-10-CM | POA: Diagnosis not present

## 2021-04-25 DIAGNOSIS — C642 Malignant neoplasm of left kidney, except renal pelvis: Secondary | ICD-10-CM | POA: Diagnosis not present

## 2021-04-25 DIAGNOSIS — Z23 Encounter for immunization: Secondary | ICD-10-CM | POA: Diagnosis not present

## 2021-04-25 DIAGNOSIS — R0609 Other forms of dyspnea: Secondary | ICD-10-CM | POA: Diagnosis not present

## 2021-04-25 DIAGNOSIS — E538 Deficiency of other specified B group vitamins: Secondary | ICD-10-CM | POA: Diagnosis not present

## 2021-04-25 DIAGNOSIS — Z79899 Other long term (current) drug therapy: Secondary | ICD-10-CM | POA: Diagnosis not present

## 2021-05-04 ENCOUNTER — Inpatient Hospital Stay: Payer: PPO | Attending: Oncology | Admitting: Oncology

## 2021-05-04 DIAGNOSIS — E538 Deficiency of other specified B group vitamins: Secondary | ICD-10-CM | POA: Diagnosis not present

## 2021-05-17 ENCOUNTER — Other Ambulatory Visit: Payer: Self-pay

## 2021-05-17 ENCOUNTER — Ambulatory Visit (INDEPENDENT_AMBULATORY_CARE_PROVIDER_SITE_OTHER): Payer: PPO | Admitting: Obstetrics & Gynecology

## 2021-05-17 ENCOUNTER — Encounter (HOSPITAL_BASED_OUTPATIENT_CLINIC_OR_DEPARTMENT_OTHER): Payer: Self-pay | Admitting: Obstetrics & Gynecology

## 2021-05-17 VITALS — BP 138/85 | HR 77 | Ht 64.0 in

## 2021-05-17 DIAGNOSIS — Z9189 Other specified personal risk factors, not elsewhere classified: Secondary | ICD-10-CM | POA: Diagnosis not present

## 2021-05-17 DIAGNOSIS — Z1502 Genetic susceptibility to malignant neoplasm of ovary: Secondary | ICD-10-CM | POA: Diagnosis not present

## 2021-05-17 DIAGNOSIS — Z1589 Genetic susceptibility to other disease: Secondary | ICD-10-CM | POA: Diagnosis not present

## 2021-05-17 DIAGNOSIS — Z1501 Genetic susceptibility to malignant neoplasm of breast: Secondary | ICD-10-CM | POA: Diagnosis not present

## 2021-05-17 DIAGNOSIS — I10 Essential (primary) hypertension: Secondary | ICD-10-CM

## 2021-05-17 DIAGNOSIS — F324 Major depressive disorder, single episode, in partial remission: Secondary | ICD-10-CM | POA: Diagnosis not present

## 2021-05-17 DIAGNOSIS — D369 Benign neoplasm, unspecified site: Secondary | ICD-10-CM | POA: Diagnosis not present

## 2021-05-17 DIAGNOSIS — N952 Postmenopausal atrophic vaginitis: Secondary | ICD-10-CM

## 2021-05-17 DIAGNOSIS — Z1509 Genetic susceptibility to other malignant neoplasm: Secondary | ICD-10-CM | POA: Diagnosis not present

## 2021-05-17 DIAGNOSIS — N83202 Unspecified ovarian cyst, left side: Secondary | ICD-10-CM | POA: Diagnosis not present

## 2021-05-17 NOTE — Patient Instructions (Addendum)
Revaree--hyaluronic acid vaginal suppositories

## 2021-05-17 NOTE — Progress Notes (Signed)
66 y.o. Carla Fuentes Married White or Caucasian female here for breast and pelvic exam.  I am also following her for recent diagnostic of renal cell carcinoma.  Has been a really hard last two years.  Brother passed.  He had positive genetic testing and encouraged pt and her sister to be tested.  Pt's was positive for CHEK2 gene.  Has increased risks for breast, colon, thyroid cancer.  Has been seen at the cancer center.  Is having yearly MMG and breast MRI at this point.  Colonoscopy every 5 years is also recommended.  She has undergone resection of left kidney.  It was stage 1.  Now being followed clinically with yearly CT scans.  Husband is having procedure at [facility] on Friday for almost blocked LAD.  She is very nervous about this.  Having some depression symptoms.  On Effexor.  Weaning off due to it causing increased appetite.  She is going to follow up with her PCP, Dr. Sabra Heck about this.  Is having some side effects with weaning off.  Aware this is common.  Pt has hx of simple ovarian cysts that has been stable.    Denies vaginal bleeding.  Is having a lot of vaginal dryness.  Does not want to use HRT.  Discussed Revaree as an option.  Has tried coconut oil without help.  Patient's last menstrual period was 12/06/2011.           Health Maintenance: PCP:  Emily Filbert, MD.  Last wellness appt was 04/25/2021.  Did blood work at that appt:  yes Vaccines are up to date:  pneumonia vaccinations need to be started Colonoscopy:  05/30/2020 MMG:  03/24/2021 Negative BMD:  03/18/2018 Last pap smear:  04/11/2020 Negative.   H/o abnormal pap smear:  no    reports that she has never smoked. She has never used smokeless tobacco. She reports current alcohol use. She reports that she does not use drugs.  Past Medical History:  Diagnosis Date   Abnormal Pap smear    Anxiety    Basal cell adenocarcinoma    skin, face   Colitis 01/05/15   treated wtih antibiotics   Depression    Family history of lung  cancer    Family history of ovarian cancer    Family history of pancreatic cancer    Family history of stomach cancer    Family history of thyroid cancer    GERD (gastroesophageal reflux disease)    Hypertension    started on medication for LEE   Positive test for genetic breast cancer susceptibility marker    CHEK2+ gene   TIA (transient ischemic attack)    initially question as TIA, evaluation negative.  Neurologist felt this was anxiety related.    Past Surgical History:  Procedure Laterality Date   CERVICAL BIOPSY  W/ LOOP ELECTRODE EXCISION     CESAREAN SECTION     2, FTP with fetal distress   COLONOSCOPY  01/2015   repeat 5 years   COLONOSCOPY WITH PROPOFOL N/A 05/30/2020   Procedure: COLONOSCOPY WITH PROPOFOL;  Surgeon: Benjamine Sprague, DO;  Location: [facility];  Service: General;  Laterality: N/A;   ESOPHAGOGASTRODUODENOSCOPY N/A 05/30/2020   Procedure: ESOPHAGOGASTRODUODENOSCOPY (EGD);  Surgeon: Benjamine Sprague, DO;  Location: [facility];  Service: General;  Laterality: N/A;   HEMORRHOID BANDING  11/2014   Dr. Tamala Julian, [place]  2001   for recurrent ovarian cyst    Current Outpatient Medications  Medication Sig Dispense Refill  ALPRAZolam (XANAX) 0.25 MG tablet Take 1 tablet (0.25 mg total) by mouth 3 (three) times daily as needed for sleep or anxiety (Take 1 tablet by mouth every 8 hours as needed for anxiety). 30 tablet 1   furosemide (LASIX) 20 MG tablet Take 20 mg by mouth daily.      MAGNESIUM PO Take by mouth daily.     potassium chloride (MICRO-K) 10 MEQ CR capsule Take by mouth.     temazepam (RESTORIL) 30 MG capsule TAKE 1 CAPSULE AT BEDTIME AS NEEDED INSOMNIA 30 capsule 5   TURMERIC PO Take by mouth.     venlafaxine XR (EFFEXOR-XR) 37.5 MG 24 hr capsule Take by mouth.     CRANBERRY PO Take by mouth.     Multiple Vitamin (MULTIVITAMIN) capsule Take 1 capsule by mouth daily.     No current facility-administered medications for this visit.     Family History  Problem Relation Age of Onset   Diabetes Mother    Hypertension Mother    Heart disease Father    Diabetes Brother    Stomach cancer Brother    Ovarian cancer Paternal Aunt    Cancer Paternal Uncle        unk type   Cervical cancer Paternal Aunt    Pancreatic cancer Cousin        d. 33s   Cancer Cousin        blood cancer   Other Cousin        d. brain tumor 30s/40s   Lung cancer Cousin        x2; hx smoking   Cancer Cousin        metastatic   Thyroid cancer Niece        dx 26s, neg. GT    Review of Systems  Psychiatric/Behavioral:  Positive for dysphoric mood.   All other systems reviewed and are negative.  Exam:   BP 138/85 (BP Location: Right Arm, Patient Position: Sitting, Cuff Size: Normal)    Pulse 77    Ht '5\' 4"'  (1.626 m) Comment: reported   LMP 12/06/2011    BMI 28.32 kg/m   Height: '5\' 4"'  (162.6 cm) (reported)  General appearance: alert, cooperative and appears stated age Breasts: normal appearance, no masses or tenderness Abdomen: soft, non-tender; bowel sounds normal; no masses,  no organomegaly Lymph nodes: Cervical, supraclavicular, and axillary nodes normal.  No abnormal inguinal nodes palpated Neurologic: Grossly normal  Pelvic: External genitalia:  no lesions              Urethra:  normal appearing urethra with no masses, tenderness or lesions              Bartholins and Skenes: normal                 Vagina: normal appearing vagina with atrophic changes and no discharge, no lesions              Cervix: no lesions              Pap taken: No. Bimanual Exam:  Uterus:  normal size, contour, position, consistency, mobility, non-tender              Adnexa: normal adnexa and no mass, fullness, tenderness               Rectovaginal: Confirms               Anus:  normal sphincter tone, no lesions  Chaperone, Kenney Houseman  [place], CMA, was present for exam.  Assessment/Plan: 1. GYN exam for high-risk Medicare patient - pap smear negative 2021.   Not indicated today. - having yearly 3D MMG and breast MRI.  Followed at high risk breast clinic - colonoscopy UTD and recommended now every 5 years - BMD 03/18/2018.  Repeat next year - vaccines reviewed.  Needs pneumonia vaccination  2. Left ovarian cyst - pt will have CT scan this year and let me know when completed so I can review  3. Major depressive disorder with single episode, in partial remission ([place]) - she will follow up with PCP, Dr. Sabra Heck, regarding medication change  4. Monoallelic mutation of CHEK2 gene in female patient - yearly breast MRI and colonoscopy every 5 years recommended  5. Primary hypertension  6. Tubular adenoma  7. Vaginal atrophy - at this time, will try to not start any hormonal therapy.  Topical revaree vaginal suppositories recommended.

## 2021-05-18 ENCOUNTER — Encounter (HOSPITAL_BASED_OUTPATIENT_CLINIC_OR_DEPARTMENT_OTHER): Payer: Self-pay | Admitting: Obstetrics & Gynecology

## 2021-05-22 ENCOUNTER — Telehealth: Payer: Self-pay | Admitting: Oncology

## 2021-05-22 NOTE — Telephone Encounter (Signed)
Pt aware of appt. She requests an MD or nurse call her back, she has a few questions.

## 2021-05-22 NOTE — Telephone Encounter (Signed)
Pt No showed to appt on 12/29. Please r/s MD only- next available appt.

## 2021-05-22 NOTE — Telephone Encounter (Signed)
Pt called to set up appt. Call back at [phone]

## 2021-05-23 ENCOUNTER — Encounter (HOSPITAL_BASED_OUTPATIENT_CLINIC_OR_DEPARTMENT_OTHER): Payer: Self-pay | Admitting: Obstetrics & Gynecology

## 2021-05-24 ENCOUNTER — Telehealth: Payer: Self-pay | Admitting: Oncology

## 2021-05-24 ENCOUNTER — Other Ambulatory Visit: Payer: Self-pay

## 2021-05-24 ENCOUNTER — Inpatient Hospital Stay: Payer: PPO | Attending: Oncology | Admitting: Oncology

## 2021-05-24 ENCOUNTER — Encounter: Payer: Self-pay | Admitting: Oncology

## 2021-05-24 DIAGNOSIS — Z85528 Personal history of other malignant neoplasm of kidney: Secondary | ICD-10-CM | POA: Diagnosis not present

## 2021-05-24 DIAGNOSIS — Z1501 Genetic susceptibility to malignant neoplasm of breast: Secondary | ICD-10-CM | POA: Diagnosis not present

## 2021-05-24 DIAGNOSIS — Z1509 Genetic susceptibility to other malignant neoplasm: Secondary | ICD-10-CM | POA: Insufficient documentation

## 2021-05-24 DIAGNOSIS — Z1502 Genetic susceptibility to malignant neoplasm of ovary: Secondary | ICD-10-CM | POA: Diagnosis not present

## 2021-05-24 DIAGNOSIS — Z1589 Genetic susceptibility to other disease: Secondary | ICD-10-CM

## 2021-05-24 DIAGNOSIS — C642 Malignant neoplasm of left kidney, except renal pelvis: Secondary | ICD-10-CM | POA: Insufficient documentation

## 2021-05-24 NOTE — Telephone Encounter (Signed)
Spoke with patient about MRI and MD follow up with Dr. Tasia Catchings in May. She was agreeable to all appointments and has confirmed receipt in Mychart.

## 2021-05-24 NOTE — Progress Notes (Signed)
HEMATOLOGY-ONCOLOGY TeleHEALTH VISIT PROGRESS NOTE  I connected with Carla Fuentes on 05/24/21  at  2:30 PM EST by video enabled telemedicine visit and verified that I am speaking with the correct person using two identifiers. I discussed the limitations, risks, security and privacy concerns of performing an evaluation and management service by telemedicine and the availability of in-person appointments. The patient expressed understanding and agreed to proceed.   Other persons participating in the visit and their role in the encounter:  None  Patient's location: Home  Provider's location: office Chief Complaint: CHEK2 heterozygous mutation, kidney cancer.    INTERVAL HISTORY Carla Fuentes is a 66 y.o. female who has above history reviewed by me today presents for follow up visit for CHEK2 heterozygous mutation, left kidney cancer.   11/21/2020 CT abdomen pelvis wo contrast Intermediate attenuation mass at upper pole of LEFT kidney 4.7 x 4.6 x 4.6 cm 11/28/2020 MRI abdomen w wo contrast: heterogeneously contrast enhancing, partially exophytic mass of the lateral superior pole of the left kidney measuring 4.7 x 4.6 cm, consistent with renal cell carcinoma. Although not definitive, appearance of the left renal vein is suspicious for renal vein invasion  01/18/2021 left nephrectomy and adrenalectomy, left perihilar lymphadenectomy.  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  Benign adrenal gland.  1 lymph node was examined and was negative for carcinoma. pT1b pN0  Patient continues to follow-up with [facility] neurology.  She has CT scanner scheduled in April 2023.  Today patient reports feeling well.  Review of Systems  Constitutional:  Negative for appetite change, chills, fatigue and fever.  HENT:   Negative for hearing loss and voice change.   Eyes:  Negative for eye problems.  Respiratory:  Negative for chest tightness and cough.   Cardiovascular:  Negative for chest pain.   Gastrointestinal:  Negative for abdominal distention, abdominal pain and blood in stool.  Endocrine: Negative for hot flashes.  Genitourinary:  Negative for difficulty urinating and frequency.   Musculoskeletal:  Negative for arthralgias.  Skin:  Negative for itching and rash.  Neurological:  Negative for extremity weakness.  Hematological:  Negative for adenopathy.  Psychiatric/Behavioral:  Negative for confusion.    Past Medical History:  Diagnosis Date   Abnormal Pap smear    Anxiety    Basal cell adenocarcinoma    skin, face   Colitis 01/05/15   treated wtih antibiotics   Depression    Family history of lung cancer    Family history of ovarian cancer    Family history of pancreatic cancer    Family history of stomach cancer    Family history of thyroid cancer    GERD (gastroesophageal reflux disease)    Hypertension    started on medication for LEE   Positive test for genetic breast cancer susceptibility marker    CHEK2+ gene   TIA (transient ischemic attack)    initially question as TIA, evaluation negative.  Neurologist felt this was anxiety related.   Past Surgical History:  Procedure Laterality Date   CERVICAL BIOPSY  W/ LOOP ELECTRODE EXCISION     CESAREAN SECTION     2, FTP with fetal distress   COLONOSCOPY  01/2015   repeat 5 years   COLONOSCOPY WITH PROPOFOL N/A 05/30/2020   Procedure: COLONOSCOPY WITH PROPOFOL;  Surgeon: Benjamine Sprague, DO;  Location: [facility];  Service: General;  Laterality: N/A;   ESOPHAGOGASTRODUODENOSCOPY N/A 05/30/2020   Procedure: ESOPHAGOGASTRODUODENOSCOPY (EGD);  Surgeon: Benjamine Sprague, DO;  Location: [facility];  Service: General;  Laterality: N/A;   HEMORRHOID BANDING  11/2014   Dr. Tamala Julian, [place]  2001   for recurrent ovarian cyst    Family History  Problem Relation Age of Onset   Diabetes Mother    Hypertension Mother    Heart disease Father    Diabetes Brother    Stomach cancer Brother    Ovarian  cancer Paternal Aunt    Cancer Paternal Uncle        unk type   Cervical cancer Paternal Aunt    Pancreatic cancer Cousin        d. 57s   Cancer Cousin        blood cancer   Other Cousin        d. brain tumor 30s/40s   Lung cancer Cousin        x2; hx smoking   Cancer Cousin        metastatic   Thyroid cancer Niece        dx 1s, neg. GT    Social History   Socioeconomic History   Marital status: Married    Spouse name: Not on file   Number of children: Not on file   Years of education: Not on file   Highest education level: Not on file  Occupational History    Employer: SELF EMPLOYED    Comment: [employer]  Tobacco Use   Smoking status: Never   Smokeless tobacco: Never  Vaping Use   Vaping Use: Never used  Substance and Sexual Activity   Alcohol use: Yes    Alcohol/week: 0.0 - 1.0 standard drinks   Drug use: No   Sexual activity: Yes    Partners: Male    Birth control/protection: Post-menopausal    Comment: husband vasectomy  Other Topics Concern   Not on file  Social History Narrative   Lives with husband, remarried. 2 children, 33YO and 35YO, one child lives [place].      Work - Public librarian      Diet - regular diet      Exercise - works out 3-4 times per week   Social Determinants of Radio broadcast assistant Strain: Not on [employer] Insecurity: Not on file  Transportation Needs: Not on file  Physical Activity: Not on file  Stress: Not on file  Social Connections: Not on file  Intimate Partner Violence: Not on file    Current Outpatient Medications on File Prior to Visit  Medication Sig Dispense Refill   ALPRAZolam (XANAX) 0.25 MG tablet Take 1 tablet (0.25 mg total) by mouth 3 (three) times daily as needed for sleep or anxiety (Take 1 tablet by mouth every 8 hours as needed for anxiety). 30 tablet 1   CRANBERRY PO Take by mouth.     furosemide (LASIX) 20 MG tablet Take 20 mg by mouth daily.      MAGNESIUM PO Take by mouth  daily.     Multiple Vitamin (MULTIVITAMIN) capsule Take 1 capsule by mouth daily.     potassium chloride (MICRO-K) 10 MEQ CR capsule Take by mouth.     temazepam (RESTORIL) 30 MG capsule TAKE 1 CAPSULE AT BEDTIME AS NEEDED INSOMNIA 30 capsule 5   TURMERIC PO Take by mouth.     venlafaxine XR (EFFEXOR-XR) 37.5 MG 24 hr capsule Take by mouth.     No current facility-administered medications on file prior to visit.    Allergies  Allergen Reactions   Penicillins Rash       Observations/Objective: Today's Vitals   05/24/21 1335  PainSc: 0-No pain   There is no height or weight on file to calculate BMI.  Physical Exam Neurological:     Mental Status: She is alert.    CBC    Component Value Date/Time   WBC 11.6 (H) 01/05/2015 1320   RBC 4.46 01/05/2015 1320   HGB 13.7 01/05/2015 1320   HGB 14.2 01/12/2014 1439   HCT 42.0 01/05/2015 1320   HCT 37.9 01/09/2013 0508   PLT 207 01/05/2015 1320   PLT 219 01/09/2013 0508   MCV 94.1 01/05/2015 1320   MCV 93 01/09/2013 0508   MCH 30.7 01/05/2015 1320   MCHC 32.6 01/05/2015 1320   RDW 12.8 01/05/2015 1320   RDW 13.2 01/09/2013 0508   LYMPHSABS 1.8 01/26/2013 1352   LYMPHSABS 2.7 01/09/2013 0508   MONOABS 0.5 01/26/2013 1352   MONOABS 0.6 01/09/2013 0508   EOSABS 0.1 01/26/2013 1352   EOSABS 0.1 01/09/2013 0508   BASOSABS 0.0 01/26/2013 1352   BASOSABS 0.0 01/09/2013 0508    CMP     Component Value Date/Time   NA 138 01/05/2015 1320   NA 138 01/09/2013 0508   K 3.3 (L) 01/05/2015 1320   K 2.9 (L) 01/09/2013 0508   CL 101 01/05/2015 1320   CL 104 01/09/2013 0508   CO2 26 01/05/2015 1320   CO2 28 01/09/2013 0508   GLUCOSE 95 01/05/2015 1320   GLUCOSE 91 01/09/2013 0508   BUN 17 01/05/2015 1320   BUN 13 01/09/2013 0508   CREATININE 0.88 01/05/2015 1320   CREATININE 0.89 01/09/2013 0508   CALCIUM 9.2 01/05/2015 1320   CALCIUM 8.5 01/09/2013 0508   PROT 7.1 01/05/2015 1320   PROT 7.8 01/07/2013 2119   ALBUMIN 3.6  01/05/2015 1320   ALBUMIN 3.4 01/07/2013 2119   AST 24 01/05/2015 1320   AST 16 01/07/2013 2119   ALT 20 01/05/2015 1320   ALT 18 01/07/2013 2119   ALKPHOS 76 01/05/2015 1320   ALKPHOS 84 01/07/2013 2119   BILITOT 0.7 01/05/2015 1320   BILITOT 0.5 01/07/2013 2119   GFRNONAA >60 01/05/2015 1320   GFRNONAA >60 01/09/2013 0508   GFRAA >60 01/05/2015 1320   GFRAA >60 01/09/2013 0508     Assessment and Plan: 1. Monoallelic mutation of CHEK2 gene in female patient   2. History of kidney cancer     # CHEK2 gene in female patient Recommend annual mammogram with annual bilateral breast MRI.  MRI is scheduled in May 2022. We discussed that evidence is not sufficient yet for prophylactic bilateral mastectomy risk reduction modality. Recommend patient to continue colon cancer screening. First-degree relatives to be tested.  #History of left kidney cancer status post nephrectomy.pT1b pN0 This is managed by her urology team at [facility].  She has surveillance CT scheduled in April at [facility]  Follow Up Instructions: Keep currently scheduled appointment in May 2023.   I discussed the assessment and treatment plan with the patient. The patient was provided an opportunity to ask questions and all were answered. The patient agreed with the plan and demonstrated an understanding of the instructions.  The patient was advised to call back or seek an in-person evaluation if the symptoms worsen or if the condition fails to improve as anticipated.   Earlie Server, MD 05/24/2021 9:44 PM

## 2021-05-24 NOTE — Progress Notes (Signed)
Patient here for follow up. Pt reports no new concerns.  °

## 2021-05-31 DIAGNOSIS — R0609 Other forms of dyspnea: Secondary | ICD-10-CM | POA: Diagnosis not present

## 2021-06-06 ENCOUNTER — Encounter (HOSPITAL_BASED_OUTPATIENT_CLINIC_OR_DEPARTMENT_OTHER): Payer: Self-pay | Admitting: Obstetrics & Gynecology

## 2021-06-06 ENCOUNTER — Ambulatory Visit (HOSPITAL_BASED_OUTPATIENT_CLINIC_OR_DEPARTMENT_OTHER)
Admission: RE | Admit: 2021-06-06 | Discharge: 2021-06-06 | Disposition: A | Discharge status: Home / Self Care | Payer: PPO | Source: Ambulatory Visit | Attending: Obstetrics & Gynecology | Admitting: Obstetrics & Gynecology

## 2021-06-06 ENCOUNTER — Telehealth (HOSPITAL_BASED_OUTPATIENT_CLINIC_OR_DEPARTMENT_OTHER): Payer: Self-pay | Admitting: Obstetrics & Gynecology

## 2021-06-06 ENCOUNTER — Other Ambulatory Visit: Payer: Self-pay

## 2021-06-06 ENCOUNTER — Ambulatory Visit (HOSPITAL_BASED_OUTPATIENT_CLINIC_OR_DEPARTMENT_OTHER): Payer: PPO | Admitting: Obstetrics & Gynecology

## 2021-06-06 VITALS — BP 140/84 | HR 73 | Ht 64.0 in

## 2021-06-06 DIAGNOSIS — R1031 Right lower quadrant pain: Secondary | ICD-10-CM

## 2021-06-06 DIAGNOSIS — N83202 Unspecified ovarian cyst, left side: Secondary | ICD-10-CM | POA: Diagnosis not present

## 2021-06-06 DIAGNOSIS — R102 Pelvic and perineal pain: Secondary | ICD-10-CM

## 2021-06-06 LAB — POCT URINALYSIS DIPSTICK
Appearance: NORMAL
Bilirubin, UA: NEGATIVE
Blood, UA: NEGATIVE
Glucose, UA: NEGATIVE
Ketones, UA: NEGATIVE
Leukocytes, UA: NEGATIVE
Nitrite, UA: NEGATIVE
Protein, UA: NEGATIVE
Spec Grav, UA: 1.015 (ref 1.010–1.025)
Urobilinogen, UA: 0.2 E.U./dL
pH, UA: 7 (ref 5.0–8.0)

## 2021-06-06 NOTE — Progress Notes (Signed)
GYNECOLOGY  VISIT  CC:   shooting vaginal pain  HPI: 66 y.o. G36P2012 Married White or Caucasian female here for complaint of some shooting vaginal pain that started this morning.  There doesn't seem to be any cause and has gotten less frequent this afternoon.  At one point she did feel it was improving today or may even had stopped but has experienced another episode.  Denies vaginal bleeding.  Has hx of renal cell carcinoma and is nervous about possibility of another cancer.  Last pap 04/11/2020 and endometrial biopsy 05/09/2020  GYNECOLOGIC HISTORY: Patient's last menstrual period was 12/06/2011. Contraception: PMP Menopausal hormone therapy: none  Patient Active Problem List   Diagnosis Date Noted   History of kidney cancer 05/24/2021   Malignant neoplasm of left kidney ([place]) 27/78/2423   Monoallelic mutation of CHEK2 gene in female patient 08/30/2020   Genetic testing 08/30/2020   Major depressive disorder, recurrent, mild ([place]) 08/24/2020   Family history of stomach cancer    Family history of ovarian cancer    Family history of pancreatic cancer    Family history of lung cancer    Family history of thyroid cancer    TIA (transient ischemic attack)    Hypertension    Tubular adenoma 12/09/2019   Family history of hypertrophic cardiomyopathy 03/10/2018   LLQ pain 11/12/2016   Left ovarian cyst 11/12/2016   Noninfectious gastroenteritis and colitis 02/24/2015   External hemorrhoid 09/23/2014   Attention and concentration deficit 12/18/2013   Edema 10/15/2013   Intention tremor 01/26/2013   Insomnia 09/24/2012   Other and unspecified hyperlipidemia 08/28/2012   Obesity (BMI 30-39.9) 08/28/2012   ANXIETY 05/19/2010   DEPRESSION 05/19/2010   GERD 05/19/2010   Major depressive disorder with single episode 05/19/2010    Past Medical History:  Diagnosis Date   Abnormal Pap smear    Anxiety    Basal cell adenocarcinoma    skin, face   Colitis 01/05/15   treated wtih  antibiotics   Depression    Family history of lung cancer    Family history of ovarian cancer    Family history of pancreatic cancer    Family history of stomach cancer    Family history of thyroid cancer    GERD (gastroesophageal reflux disease)    Hypertension    started on medication for LEE   Positive test for genetic breast cancer susceptibility marker    CHEK2+ gene   TIA (transient ischemic attack)    initially question as TIA, evaluation negative.  Neurologist felt this was anxiety related.    Past Surgical History:  Procedure Laterality Date   CERVICAL BIOPSY  W/ LOOP ELECTRODE EXCISION     CESAREAN SECTION     2, FTP with fetal distress   COLONOSCOPY  01/2015   repeat 5 years   COLONOSCOPY WITH PROPOFOL N/A 05/30/2020   Procedure: COLONOSCOPY WITH PROPOFOL;  Surgeon: Benjamine Sprague, DO;  Location: [facility];  Service: General;  Laterality: N/A;   ESOPHAGOGASTRODUODENOSCOPY N/A 05/30/2020   Procedure: ESOPHAGOGASTRODUODENOSCOPY (EGD);  Surgeon: Benjamine Sprague, DO;  Location: [facility];  Service: General;  Laterality: N/A;   HEMORRHOID BANDING  11/2014   Dr. Tamala Julian, [place]  2001   for recurrent ovarian cyst    MEDS:   Current Outpatient Medications on File Prior to Visit  Medication Sig Dispense Refill   ALPRAZolam (XANAX) 0.25 MG tablet Take 1 tablet (0.25 mg total) by mouth 3 (three) times daily as needed for  sleep or anxiety (Take 1 tablet by mouth every 8 hours as needed for anxiety). 30 tablet 1   CRANBERRY PO Take by mouth.     FLUoxetine (PROZAC) 40 MG capsule Take 40 mg by mouth daily.     furosemide (LASIX) 20 MG tablet Take 20 mg by mouth daily.      MAGNESIUM PO Take by mouth daily.     potassium chloride (MICRO-K) 10 MEQ CR capsule Take by mouth.     temazepam (RESTORIL) 30 MG capsule TAKE 1 CAPSULE AT BEDTIME AS NEEDED INSOMNIA 30 capsule 5   TURMERIC PO Take by mouth.     Multiple Vitamin (MULTIVITAMIN) capsule Take 1 capsule by  mouth daily. (Patient not taking: Reported on 06/06/2021)     No current facility-administered medications on file prior to visit.    ALLERGIES: Penicillins  Family History  Problem Relation Age of Onset   Diabetes Mother    Hypertension Mother    Heart disease Father    Diabetes Brother    Stomach cancer Brother    Ovarian cancer Paternal Aunt    Cancer Paternal Uncle        unk type   Cervical cancer Paternal Aunt    Pancreatic cancer Cousin        d. 7s   Cancer Cousin        blood cancer   Other Cousin        d. brain tumor 30s/40s   Lung cancer Cousin        x2; hx smoking   Cancer Cousin        metastatic   Thyroid cancer Niece        dx 54s, neg. GT    SH:  married, non smoker  Review of Systems  Genitourinary:        Vaginal pain   PHYSICAL EXAMINATION:    BP 140/84    Pulse 73    Ht '5\' 4"'  (1.626 m)    LMP 12/06/2011    BMI 28.32 kg/m     General appearance: alert, cooperative and appears stated age Abdomen: soft, non-tender; bowel sounds normal; no masses,  no organomegaly Lymph:  no inguinal LAD noted  Pelvic: External genitalia:  no lesions              Urethra:  normal appearing urethra with no masses, tenderness or lesions              Bartholins and Skenes: normal                 Vagina: normal appearing vagina with normal color and discharge, no lesions              Cervix: no lesions              Bimanual Exam:  Uterus:  normal size, contour, position, consistency, mobility, non-tender              Adnexa: no mass, fullness, tenderness  Chaperone, Octaviano Batty, CMA, was present for exam.  Assessment/Plan: 1. Pelvic pain - given pain level, urine obtained that was negative.  So, PUS ordered today as well. - POCT urinalysis dipstick - US PELVIC COMPLETE WITH TRANSVAGINAL; Future  2. RLQ abdominal pain  3. Vaginal pain

## 2021-06-06 NOTE — Telephone Encounter (Signed)
Spoke with patient regarding myChart message. Pt states that she has been having a sharp pain that shoots up through her vaginal area. She states that it has happened 6 times this morning. Pt states that when it happens it causes her to double over in pain. Pt provided with appt for evaluation.

## 2021-06-06 NOTE — Telephone Encounter (Signed)
Patient called stated that she just missed a called from [place] .Per patient can someone please call her back ?

## 2021-06-09 ENCOUNTER — Encounter: Payer: Self-pay | Admitting: Oncology

## 2021-06-09 NOTE — Telephone Encounter (Signed)
Results available in Imaging tab.

## 2021-06-13 NOTE — Progress Notes (Signed)
Attempted to do pre-op phone call. However, patient states that she is cancelling sx d/t wanting a second opinion and that she has notified Dr. Ammie Ferrier office.

## 2021-06-20 ENCOUNTER — Ambulatory Visit (HOSPITAL_BASED_OUTPATIENT_CLINIC_OR_DEPARTMENT_OTHER): Admit: 2021-06-20 | Payer: PPO | Admitting: Obstetrics & Gynecology

## 2021-06-20 DIAGNOSIS — R9389 Abnormal findings on diagnostic imaging of other specified body structures: Secondary | ICD-10-CM | POA: Diagnosis not present

## 2021-06-20 SURGERY — DILATATION AND CURETTAGE /HYSTEROSCOPY
Anesthesia: Choice

## 2021-07-03 ENCOUNTER — Telehealth: Payer: PPO | Admitting: Oncology

## 2021-07-26 DIAGNOSIS — D2271 Melanocytic nevi of right lower limb, including hip: Secondary | ICD-10-CM | POA: Diagnosis not present

## 2021-07-26 DIAGNOSIS — L821 Other seborrheic keratosis: Secondary | ICD-10-CM | POA: Diagnosis not present

## 2021-07-26 DIAGNOSIS — D2262 Melanocytic nevi of left upper limb, including shoulder: Secondary | ICD-10-CM | POA: Diagnosis not present

## 2021-07-26 DIAGNOSIS — D2272 Melanocytic nevi of left lower limb, including hip: Secondary | ICD-10-CM | POA: Diagnosis not present

## 2021-07-26 DIAGNOSIS — D2261 Melanocytic nevi of right upper limb, including shoulder: Secondary | ICD-10-CM | POA: Diagnosis not present

## 2021-07-26 DIAGNOSIS — D225 Melanocytic nevi of trunk: Secondary | ICD-10-CM | POA: Diagnosis not present

## 2021-08-07 DIAGNOSIS — R300 Dysuria: Secondary | ICD-10-CM | POA: Diagnosis not present

## 2021-08-17 DIAGNOSIS — N3090 Cystitis, unspecified without hematuria: Secondary | ICD-10-CM | POA: Diagnosis not present

## 2021-08-18 DIAGNOSIS — N83202 Unspecified ovarian cyst, left side: Secondary | ICD-10-CM | POA: Diagnosis not present

## 2021-08-18 DIAGNOSIS — Z08 Encounter for follow-up examination after completed treatment for malignant neoplasm: Secondary | ICD-10-CM | POA: Diagnosis not present

## 2021-08-18 DIAGNOSIS — R931 Abnormal findings on diagnostic imaging of heart and coronary circulation: Secondary | ICD-10-CM | POA: Diagnosis not present

## 2021-08-18 DIAGNOSIS — R590 Localized enlarged lymph nodes: Secondary | ICD-10-CM | POA: Diagnosis not present

## 2021-08-18 DIAGNOSIS — C642 Malignant neoplasm of left kidney, except renal pelvis: Secondary | ICD-10-CM | POA: Diagnosis not present

## 2021-08-18 DIAGNOSIS — R918 Other nonspecific abnormal finding of lung field: Secondary | ICD-10-CM | POA: Diagnosis not present

## 2021-08-18 DIAGNOSIS — Z905 Acquired absence of kidney: Secondary | ICD-10-CM | POA: Diagnosis not present

## 2021-08-18 DIAGNOSIS — Z85528 Personal history of other malignant neoplasm of kidney: Secondary | ICD-10-CM | POA: Diagnosis not present

## 2021-08-21 DIAGNOSIS — R9389 Abnormal findings on diagnostic imaging of other specified body structures: Secondary | ICD-10-CM | POA: Diagnosis not present

## 2021-08-21 DIAGNOSIS — N841 Polyp of cervix uteri: Secondary | ICD-10-CM | POA: Diagnosis not present

## 2021-08-21 DIAGNOSIS — Z90721 Acquired absence of ovaries, unilateral: Secondary | ICD-10-CM | POA: Diagnosis not present

## 2021-08-21 DIAGNOSIS — N858 Other specified noninflammatory disorders of uterus: Secondary | ICD-10-CM | POA: Diagnosis not present

## 2021-08-21 DIAGNOSIS — I10 Essential (primary) hypertension: Secondary | ICD-10-CM | POA: Diagnosis not present

## 2021-08-21 DIAGNOSIS — Z79899 Other long term (current) drug therapy: Secondary | ICD-10-CM | POA: Diagnosis not present

## 2021-08-21 DIAGNOSIS — Z85528 Personal history of other malignant neoplasm of kidney: Secondary | ICD-10-CM | POA: Diagnosis not present

## 2021-08-21 DIAGNOSIS — Z905 Acquired absence of kidney: Secondary | ICD-10-CM | POA: Diagnosis not present

## 2021-08-21 HISTORY — PX: HYSTEROSCOPY WITH D & C: SHX1775

## 2021-09-04 DIAGNOSIS — Z8249 Family history of ischemic heart disease and other diseases of the circulatory system: Secondary | ICD-10-CM | POA: Diagnosis not present

## 2021-09-08 ENCOUNTER — Ambulatory Visit: Payer: PPO | Admitting: Oncology

## 2021-09-11 ENCOUNTER — Ambulatory Visit
Admission: RE | Admit: 2021-09-11 | Discharge: 2021-09-11 | Disposition: A | Discharge status: Home / Self Care | Payer: PPO | Source: Ambulatory Visit | Attending: Oncology | Admitting: Oncology

## 2021-09-11 DIAGNOSIS — Z1502 Genetic susceptibility to malignant neoplasm of ovary: Secondary | ICD-10-CM | POA: Diagnosis not present

## 2021-09-11 DIAGNOSIS — Z803 Family history of malignant neoplasm of breast: Secondary | ICD-10-CM | POA: Diagnosis not present

## 2021-09-11 DIAGNOSIS — Z1509 Genetic susceptibility to other malignant neoplasm: Secondary | ICD-10-CM | POA: Diagnosis not present

## 2021-09-11 DIAGNOSIS — Z1501 Genetic susceptibility to malignant neoplasm of breast: Secondary | ICD-10-CM | POA: Insufficient documentation

## 2021-09-11 DIAGNOSIS — Z1589 Genetic susceptibility to other disease: Secondary | ICD-10-CM | POA: Insufficient documentation

## 2021-09-11 MED ORDER — GADOBUTROL 1 MMOL/ML IV SOLN
7.0000 mL | Freq: Once | INTRAVENOUS | Status: AC | PRN
  Administered 2021-09-11: 7 mL via INTRAVENOUS

## 2021-09-13 ENCOUNTER — Inpatient Hospital Stay: Payer: PPO | Admitting: Oncology

## 2021-09-13 ENCOUNTER — Telehealth: Payer: Self-pay

## 2021-09-13 NOTE — Telephone Encounter (Signed)
Spoke with patient in regards to missed appt this morning at 10 am. Patient states she was confused on her appt dates. Patient leaving for [facility] today, will be back next Tuesday.  ? ?Please r/s MD appt for next week after Tuesday. Please inform patient of appointment. Thanks  ?

## 2021-09-25 DIAGNOSIS — M7532 Calcific tendinitis of left shoulder: Secondary | ICD-10-CM | POA: Diagnosis not present

## 2021-09-25 DIAGNOSIS — M25562 Pain in left knee: Secondary | ICD-10-CM | POA: Diagnosis not present

## 2021-09-25 DIAGNOSIS — M7541 Impingement syndrome of right shoulder: Secondary | ICD-10-CM | POA: Diagnosis not present

## 2021-09-27 ENCOUNTER — Encounter: Payer: Self-pay | Admitting: Oncology

## 2021-09-27 ENCOUNTER — Inpatient Hospital Stay: Payer: PPO | Attending: Oncology | Admitting: Oncology

## 2021-09-27 VITALS — BP 130/90 | HR 67 | Temp 97.3°F | Ht 64.0 in | Wt 164.0 lb

## 2021-09-27 DIAGNOSIS — Z1501 Genetic susceptibility to malignant neoplasm of breast: Secondary | ICD-10-CM | POA: Diagnosis not present

## 2021-09-27 DIAGNOSIS — Z85528 Personal history of other malignant neoplasm of kidney: Secondary | ICD-10-CM | POA: Insufficient documentation

## 2021-09-27 DIAGNOSIS — Z1502 Genetic susceptibility to malignant neoplasm of ovary: Secondary | ICD-10-CM

## 2021-09-27 DIAGNOSIS — Z1509 Genetic susceptibility to other malignant neoplasm: Secondary | ICD-10-CM

## 2021-09-27 DIAGNOSIS — Z1589 Genetic susceptibility to other disease: Secondary | ICD-10-CM | POA: Diagnosis not present

## 2021-09-28 ENCOUNTER — Encounter: Payer: Self-pay | Admitting: Oncology

## 2021-09-28 NOTE — Progress Notes (Signed)
Hematology/Oncology Progress note [phone] [phone]      Patient Care Team: Rusty Aus, MD as PCP - General (Internal Medicine)  REFERRING PROVIDER: Rusty Aus, MD  CHIEF COMPLAINTS/REASON FOR VISIT:  High risk for development of breast cancer, kidney cancer  HISTORY OF PRESENTING ILLNESS:   Carla Fuentes is a  66 y.o.  female with PMH listed below was seen in consultation at the request of  Rusty Aus, MD  for evaluation of high risk for development of breast cancer  Patient reports that her brother has been sick for the past few years due to unintentional weight loss and was eventually diagnosed with gastric cancer and passed away.  Brother told patient " to check breast cancer gene". She mentioned to gynecologist and was referred to genetic counselor for testing.    08/10/2020, patient had a genetic testing was tested positive for heterozygous CHEK2 Mother was adopted. Family history is positive for brother with stomach cancer, paternal aunt ovarian cancer, another paternal aunt cervical cancer, paternal uncle cancer with unknown details, paternal cousin pancreatic cancer, another cousin with blood cancer, second paternal cousin lung cancer, niece thyroid cancer   11/21/2020 CT abdomen pelvis wo contrast Intermediate attenuation mass at upper pole of LEFT kidney 4.7 x 4.6 x 4.6 cm 11/28/2020 MRI abdomen w wo contrast: heterogeneously contrast enhancing, partially exophytic mass of the lateral superior pole of the left kidney measuring 4.7 x 4.6 cm, consistent with renal cell carcinoma. Although not definitive, appearance of the left renal vein is suspicious for renal vein invasion   01/18/2021 left nephrectomy and adrenalectomy, left perihilar lymphadenectomy.  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  Benign adrenal gland.  1 lymph node was examined and was negative for carcinoma. pT1b pN0   INTERVAL HISTORY Carla Fuentes is a 66  y.o. female who has above history reviewed by me today presents for follow up visit for management of  At the risk of developing breast cancer, history of left renal cell carcinoma.  08/18/2021, CT RCC protocol abdomen pelvis with and without contrast at [facility] showed status post left nephrectomy without evidence of recurrence or metastatic disease.  Nonspecific left inguinal lymphadenopathy.  Attention on follow-up.  Left ovarian cyst can be assessed for stability on follow-up imaging.   08/21/2021, patient had hysteroscopy, D&C, benign endometrial biopsy. 09/04/2021, seen by cardiology for family history of hypertrophic cardiomyopathy. 09/11/2021, bilateral breast MRI with and without contrast showed no evidence of breast malignancy.  Review of Systems  Constitutional:  Negative for appetite change, chills, fatigue and fever.  HENT:   Negative for hearing loss and voice change.   Eyes:  Negative for eye problems.  Respiratory:  Negative for chest tightness and cough.   Cardiovascular:  Negative for chest pain.  Gastrointestinal:  Negative for abdominal distention, abdominal pain and blood in stool.  Endocrine: Negative for hot flashes.  Genitourinary:  Negative for difficulty urinating and frequency.   Musculoskeletal:  Negative for arthralgias.  Skin:  Negative for itching and rash.  Neurological:  Negative for extremity weakness.  Hematological:  Negative for adenopathy.  Psychiatric/Behavioral:  Negative for confusion.    MEDICAL HISTORY:  Past Medical History:  Diagnosis Date   Abnormal Pap smear    Anxiety    Basal cell adenocarcinoma    skin, face   Colitis 01/05/15   treated wtih antibiotics   Depression    Family history of lung cancer    Family history of  ovarian cancer    Family history of pancreatic cancer    Family history of stomach cancer    Family history of thyroid cancer    GERD (gastroesophageal reflux disease)    Hypertension    started on medication for LEE    Positive test for genetic breast cancer susceptibility marker    CHEK2+ gene   TIA (transient ischemic attack)    initially question as TIA, evaluation negative.  Neurologist felt this was anxiety related.    SURGICAL HISTORY: Past Surgical History:  Procedure Laterality Date   CERVICAL BIOPSY  W/ LOOP ELECTRODE EXCISION     CESAREAN SECTION     2, FTP with fetal distress   COLONOSCOPY  01/2015   repeat 5 years   COLONOSCOPY WITH PROPOFOL N/A 05/30/2020   Procedure: COLONOSCOPY WITH PROPOFOL;  Surgeon: Benjamine Sprague, DO;  Location: [facility];  Service: General;  Laterality: N/A;   ESOPHAGOGASTRODUODENOSCOPY N/A 05/30/2020   Procedure: ESOPHAGOGASTRODUODENOSCOPY (EGD);  Surgeon: Benjamine Sprague, DO;  Location: [facility];  Service: General;  Laterality: N/A;   HEMORRHOID BANDING  11/2014   Dr. Tamala Julian, [place]  2001   for recurrent ovarian cyst    SOCIAL HISTORY: Social History   Socioeconomic History   Marital status: Married    Spouse name: Not on file   Number of children: Not on file   Years of education: Not on file   Highest education level: Not on file  Occupational History    Employer: SELF EMPLOYED    Comment: [employer]  Tobacco Use   Smoking status: Never   Smokeless tobacco: Never  Vaping Use   Vaping Use: Never used  Substance and Sexual Activity   Alcohol use: Yes    Alcohol/week: 0.0 - 1.0 standard drinks   Drug use: No   Sexual activity: Yes    Partners: Male    Birth control/protection: Post-menopausal    Comment: husband vasectomy  Other Topics Concern   Not on file  Social History Narrative   Lives with husband, remarried. 2 children, 33YO and 35YO, one child lives [place].      Work - Public librarian      Diet - regular diet      Exercise - works out 3-4 times per week   Social Determinants of Radio broadcast assistant Strain: Not on [employer] Insecurity: Not on file  Transportation Needs: Not on  file  Physical Activity: Not on file  Stress: Not on file  Social Connections: Not on file  Intimate Partner Violence: Not on file    FAMILY HISTORY: Family History  Problem Relation Age of Onset   Diabetes Mother    Hypertension Mother    Heart disease Father    Diabetes Brother    Stomach cancer Brother    Ovarian cancer Paternal Aunt    Cancer Paternal Uncle        unk type   Cervical cancer Paternal Aunt    Pancreatic cancer Cousin        d. 59s   Cancer Cousin        blood cancer   Other Cousin        d. brain tumor 30s/40s   Lung cancer Cousin        x2; hx smoking   Cancer Cousin        metastatic   Thyroid cancer Niece        dx 3s, neg.  GT    ALLERGIES:  is allergic to penicillins.  MEDICATIONS:  Current Outpatient Medications  Medication Sig Dispense Refill   ALPRAZolam (XANAX) 0.25 MG tablet Take 1 tablet (0.25 mg total) by mouth 3 (three) times daily as needed for sleep or anxiety (Take 1 tablet by mouth every 8 hours as needed for anxiety). 30 tablet 1   CRANBERRY PO Take by mouth.     FLUoxetine (PROZAC) 40 MG capsule Take 40 mg by mouth daily.     furosemide (LASIX) 20 MG tablet Take 20 mg by mouth daily.      lisinopril (ZESTRIL) 10 MG tablet lisinopril 10 mg tablet  TAKE ONE TABLET BY MOUTH ONE TIME DAILY     MAGNESIUM PO Take by mouth daily.     Multiple Vitamin (MULTIVITAMIN) capsule Take 1 capsule by mouth daily.     potassium chloride (MICRO-K) 10 MEQ CR capsule Take by mouth.     temazepam (RESTORIL) 30 MG capsule TAKE 1 CAPSULE AT BEDTIME AS NEEDED INSOMNIA 30 capsule 5   TURMERIC PO Take by mouth.     No current facility-administered medications for this visit.     PHYSICAL EXAMINATION: ECOG PERFORMANCE STATUS: 0 - Asymptomatic Vitals:   09/27/21 1406  BP: 130/90  Pulse: 67  Temp: (!) 97.3 F (36.3 C)   Filed Weights   09/27/21 1406  Weight: 164 lb (74.4 kg)    Physical Exam Constitutional:      General: She is not in  acute distress. HENT:     Head: Normocephalic and atraumatic.  Eyes:     General: No scleral icterus. Cardiovascular:     Rate and Rhythm: Normal rate and regular rhythm.     Heart sounds: Normal heart sounds.  Pulmonary:     Effort: Pulmonary effort is normal. No respiratory distress.     Breath sounds: No wheezing.  Abdominal:     General: Bowel sounds are normal. There is no distension.     Palpations: Abdomen is soft.  Musculoskeletal:        General: No deformity. Normal range of motion.     Cervical back: Normal range of motion and neck supple.  Skin:    General: Skin is warm and dry.     Findings: No erythema or rash.  Neurological:     Mental Status: She is alert and oriented to person, place, and time. Mental status is at baseline.     Cranial Nerves: No cranial nerve deficit.  Psychiatric:     Comments: Anxious    LABORATORY DATA:  I have reviewed the data as listed Lab Results  Component Value Date   WBC 11.6 (H) 01/05/2015   HGB 13.7 01/05/2015   HCT 42.0 01/05/2015   MCV 94.1 01/05/2015   PLT 207 01/05/2015   No results for input(s): NA, K, CL, CO2, GLUCOSE, BUN, CREATININE, CALCIUM, GFRNONAA, GFRAA, PROT, ALBUMIN, AST, ALT, ALKPHOS, BILITOT, BILIDIR, IBILI in the last 8760 hours. Iron/TIBC/Ferritin/ %Sat No results found for: IRON, TIBC, FERRITIN, IRONPCTSAT    RADIOGRAPHIC STUDIES: I have personally reviewed the radiological images as listed and agreed with the findings in the report. MR BREAST BILATERAL W WO CONTRAST INC CAD  Result Date: 09/11/2021 CLINICAL DATA:  High risk screening. Family history of breast carcinoma. Positive CHEK2 gene mutation. EXAM: BILATERAL BREAST MRI WITH AND WITHOUT CONTRAST TECHNIQUE: Multiplanar, multisequence MR images of both breasts were obtained prior to and following the intravenous administration of 7 ml of Gadavist Three-dimensional  MR images were rendered by post-processing of the original MR data on an independent  workstation. The three-dimensional MR images were interpreted, and findings are reported in the following complete MRI report for this study. Three dimensional images were evaluated at the independent interpreting workstation using the DynaCAD thin client. COMPARISON:  Prior exams including previous breast MRI dated 09/26/2020. FINDINGS: Breast composition: b. Scattered fibroglandular tissue. Background parenchymal enhancement: Mild Right breast: No mass or abnormal enhancement. Left breast: No mass or abnormal enhancement. Lymph nodes: No abnormal appearing lymph nodes. Ancillary findings:  None. IMPRESSION: No evidence of breast malignancy. RECOMMENDATION: 1. Annual screening mammography. 2. Supplemental high risk annual screening breast MRI. BI-RADS CATEGORY  1: Negative. Electronically Signed   By: Lajean Manes M.D.   On: 09/11/2021 10:47     ASSESSMENT & PLAN:  1. Monoallelic mutation of CHEK2 gene in female patient   2. 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  Will obtain bilateral screening mammogram in 6 months. Evidence is insufficient for risk reduction modality.  #Patient has started colon cancer screening.  Recommend colonoscopy every 5 years. #Left kidney cancer status post nephrectomy. Patient had CT done at [facility] which showed no evidence of recurrence.  Nonspecific left inguinal lymphadenopathy, attention on follow-up.  We reviewed NCCN guideline of surveillance imaging. In 6 months, obtain CT chest abdomen pelvis without contrast due to her decreased kidney function.  Patient has multiple questions and all answered to her satisfaction.  Patient will follow-up in 6 months.  Orders Placed This Encounter  Procedures   MM 3D SCREEN BREAST BILATERAL    Standing Status:   Future    Standing Expiration Date:   09/28/2022    Order Specific Question:    Reason for Exam (SYMPTOM  OR DIAGNOSIS REQUIRED)    Answer:   screening mamo    Order Specific Question:   Preferred imaging location?    Answer:   [facility]   CT ABDOMEN PELVIS WO CONTRAST    Standing Status:   Future    Standing Expiration Date:   09/28/2022    Order Specific Question:   Preferred imaging location?    Answer:   [facility]    Order Specific Question:   Is Oral Contrast requested for this exam?    Answer:   Yes, Per Radiology protocol    Order Specific Question:   Radiology Contrast Protocol - do NOT remove file path    Answer:   [email or web address]   CBC with Differential/Platelet    Standing Status:   Future    Standing Expiration Date:   09/28/2022   Comprehensive metabolic panel    Standing Status:   Future    Standing Expiration Date:   09/27/2022   Lactate dehydrogenase    Standing Status:   Future    Standing Expiration Date:   09/28/2022    All questions were answered. The patient knows to call the clinic with any problems questions or concerns.  cc Rusty Aus, MD   We spent sufficient time to discuss many aspect of care, questions were answered to patient's satisfaction. A total of 25 minutes was spent on this visit.  With 10 minutes spent reviewing image findings, review other providers recommendation, 10 minutes counseling the patient.  Additional 5 minutes was spent on answering patient's questions.    Earlie Server, MD, PhD Hematology  Oncology 09/28/2021

## 2021-10-17 DIAGNOSIS — Z79899 Other long term (current) drug therapy: Secondary | ICD-10-CM | POA: Diagnosis not present

## 2021-10-17 DIAGNOSIS — C642 Malignant neoplasm of left kidney, except renal pelvis: Secondary | ICD-10-CM | POA: Diagnosis not present

## 2021-10-17 DIAGNOSIS — Z0000 Encounter for general adult medical examination without abnormal findings: Secondary | ICD-10-CM | POA: Diagnosis not present

## 2021-10-24 DIAGNOSIS — Z0000 Encounter for general adult medical examination without abnormal findings: Secondary | ICD-10-CM | POA: Diagnosis not present

## 2021-10-24 DIAGNOSIS — R5383 Other fatigue: Secondary | ICD-10-CM | POA: Diagnosis not present

## 2021-10-24 DIAGNOSIS — F330 Major depressive disorder, recurrent, mild: Secondary | ICD-10-CM | POA: Diagnosis not present

## 2021-10-24 DIAGNOSIS — C642 Malignant neoplasm of left kidney, except renal pelvis: Secondary | ICD-10-CM | POA: Diagnosis not present

## 2021-10-24 DIAGNOSIS — Z79899 Other long term (current) drug therapy: Secondary | ICD-10-CM | POA: Diagnosis not present

## 2021-10-24 DIAGNOSIS — R739 Hyperglycemia, unspecified: Secondary | ICD-10-CM | POA: Diagnosis not present

## 2022-01-22 DIAGNOSIS — D692 Other nonthrombocytopenic purpura: Secondary | ICD-10-CM | POA: Diagnosis not present

## 2022-02-22 ENCOUNTER — Telehealth: Payer: Self-pay | Admitting: Oncology

## 2022-02-23 DIAGNOSIS — C642 Malignant neoplasm of left kidney, except renal pelvis: Secondary | ICD-10-CM | POA: Diagnosis not present

## 2022-02-27 ENCOUNTER — Ambulatory Visit
Admission: RE | Admit: 2022-02-27 | Discharge: 2022-02-27 | Disposition: A | Discharge status: Home / Self Care | Payer: PPO | Source: Ambulatory Visit | Attending: Oncology | Admitting: Oncology

## 2022-02-27 DIAGNOSIS — Z85528 Personal history of other malignant neoplasm of kidney: Secondary | ICD-10-CM | POA: Insufficient documentation

## 2022-02-27 DIAGNOSIS — K5730 Diverticulosis of large intestine without perforation or abscess without bleeding: Secondary | ICD-10-CM | POA: Diagnosis not present

## 2022-02-28 ENCOUNTER — Telehealth: Payer: Self-pay | Admitting: *Deleted

## 2022-02-28 ENCOUNTER — Other Ambulatory Visit: Payer: Self-pay

## 2022-02-28 DIAGNOSIS — R911 Solitary pulmonary nodule: Secondary | ICD-10-CM

## 2022-02-28 NOTE — Telephone Encounter (Signed)
Patient called asking for return call to go over her results of CT IMPRESSION: 1. Status post left nephrectomy without evidence of local recurrence. 2. No evidence of metastatic disease in the abdomen or pelvis. 3. Right middle lobe 4 mm pulmonary nodule is nonspecific, and was not seen on recent prior imaging studies although this appears to be outside of the few field of view on those examinations. Suggest follow-up dedicated chest CT for further evaluation. 4. Cystic left ovarian lesions previously evaluated by pelvic ultrasound June 06, 2021 and characterized as 3 adjacent renal cysts measuring up to 3.6 cm appear unchanged from CT November 21, 2020. Recommendation for follow-up of this lesion from prior ultrasound was 3-6 months from that study. 5. Sigmoid colonic diverticulosis without findings of acute diverticulitis.     Electronically Signed   By: Dahlia Bailiff M.D.   On: 02/28/2022 12:12

## 2022-02-28 NOTE — Telephone Encounter (Signed)
Patient informed of MD recommendations.   Please schedule CT chest and inform pt of appt (pt prefers asap appt if possible).

## 2022-03-01 ENCOUNTER — Other Ambulatory Visit: Payer: Self-pay | Admitting: Internal Medicine

## 2022-03-01 DIAGNOSIS — R911 Solitary pulmonary nodule: Secondary | ICD-10-CM

## 2022-03-01 DIAGNOSIS — C642 Malignant neoplasm of left kidney, except renal pelvis: Secondary | ICD-10-CM

## 2022-03-02 NOTE — Telephone Encounter (Addendum)
Dr. Sabra Heck has ordered CT chest w/ and it has been scheduled. Dr. Tasia Catchings aware. Dr. Collie Siad CT order has been dc'd.

## 2022-03-07 DIAGNOSIS — C642 Malignant neoplasm of left kidney, except renal pelvis: Secondary | ICD-10-CM | POA: Diagnosis not present

## 2022-03-07 DIAGNOSIS — R911 Solitary pulmonary nodule: Secondary | ICD-10-CM | POA: Diagnosis not present

## 2022-03-13 ENCOUNTER — Ambulatory Visit
Admission: RE | Admit: 2022-03-13 | Discharge: 2022-03-13 | Disposition: A | Discharge status: Home / Self Care | Payer: PPO | Source: Ambulatory Visit | Attending: Internal Medicine | Admitting: Internal Medicine

## 2022-03-13 DIAGNOSIS — C642 Malignant neoplasm of left kidney, except renal pelvis: Secondary | ICD-10-CM | POA: Diagnosis not present

## 2022-03-13 DIAGNOSIS — R911 Solitary pulmonary nodule: Secondary | ICD-10-CM | POA: Diagnosis not present

## 2022-03-13 DIAGNOSIS — R918 Other nonspecific abnormal finding of lung field: Secondary | ICD-10-CM | POA: Diagnosis not present

## 2022-03-13 LAB — POCT I-STAT CREATININE: Creatinine, Ser: 1.3 mg/dL — ABNORMAL HIGH (ref 0.44–1.00)

## 2022-03-13 MED ORDER — IOHEXOL 300 MG/ML  SOLN
60.0000 mL | Freq: Once | INTRAMUSCULAR | Status: AC | PRN
  Administered 2022-03-13: 60 mL via INTRAVENOUS

## 2022-03-19 ENCOUNTER — Encounter: Payer: Self-pay | Admitting: Oncology

## 2022-03-19 NOTE — Telephone Encounter (Signed)
Please advise 

## 2022-04-04 ENCOUNTER — Inpatient Hospital Stay (HOSPITAL_BASED_OUTPATIENT_CLINIC_OR_DEPARTMENT_OTHER): Payer: PPO | Admitting: Oncology

## 2022-04-04 ENCOUNTER — Inpatient Hospital Stay: Payer: PPO | Attending: Oncology

## 2022-04-04 ENCOUNTER — Encounter: Payer: Self-pay | Admitting: Oncology

## 2022-04-04 ENCOUNTER — Telehealth: Payer: Self-pay | Admitting: Oncology

## 2022-04-04 VITALS — BP 130/76 | HR 72 | Temp 99.0°F | Resp 18

## 2022-04-04 DIAGNOSIS — Z1509 Genetic susceptibility to other malignant neoplasm: Secondary | ICD-10-CM

## 2022-04-04 DIAGNOSIS — C642 Malignant neoplasm of left kidney, except renal pelvis: Secondary | ICD-10-CM | POA: Insufficient documentation

## 2022-04-04 DIAGNOSIS — N183 Chronic kidney disease, stage 3 unspecified: Secondary | ICD-10-CM | POA: Insufficient documentation

## 2022-04-04 DIAGNOSIS — Z905 Acquired absence of kidney: Secondary | ICD-10-CM | POA: Insufficient documentation

## 2022-04-04 DIAGNOSIS — Z1589 Genetic susceptibility to other disease: Secondary | ICD-10-CM | POA: Diagnosis not present

## 2022-04-04 DIAGNOSIS — Z1502 Genetic susceptibility to malignant neoplasm of ovary: Secondary | ICD-10-CM | POA: Diagnosis not present

## 2022-04-04 DIAGNOSIS — R911 Solitary pulmonary nodule: Secondary | ICD-10-CM | POA: Diagnosis not present

## 2022-04-04 DIAGNOSIS — Z85528 Personal history of other malignant neoplasm of kidney: Secondary | ICD-10-CM | POA: Diagnosis not present

## 2022-04-04 DIAGNOSIS — N1831 Chronic kidney disease, stage 3a: Secondary | ICD-10-CM | POA: Diagnosis not present

## 2022-04-04 DIAGNOSIS — Z1501 Genetic susceptibility to malignant neoplasm of breast: Secondary | ICD-10-CM | POA: Diagnosis not present

## 2022-04-04 LAB — COMPREHENSIVE METABOLIC PANEL
ALT: 17 U/L (ref 0–44)
AST: 20 U/L (ref 15–41)
Albumin: 3.9 g/dL (ref 3.5–5.0)
Alkaline Phosphatase: 63 U/L (ref 38–126)
Anion gap: 7 (ref 5–15)
BUN: 25 mg/dL — ABNORMAL HIGH (ref 8–23)
CO2: 27 mmol/L (ref 22–32)
Calcium: 9.2 mg/dL (ref 8.9–10.3)
Chloride: 104 mmol/L (ref 98–111)
Creatinine, Ser: 1.34 mg/dL — ABNORMAL HIGH (ref 0.44–1.00)
GFR, Estimated: 43 mL/min — ABNORMAL LOW (ref >60)
Glucose, Bld: 94 mg/dL (ref 70–99)
Potassium: 4.3 mmol/L (ref 3.5–5.1)
Sodium: 138 mmol/L (ref 135–145)
Total Bilirubin: 0.5 mg/dL (ref 0.3–1.2)
Total Protein: 7.1 g/dL (ref 6.5–8.1)

## 2022-04-04 LAB — CBC WITH DIFFERENTIAL/PLATELET
Abs Immature Granulocytes: 0.01 10*3/uL (ref 0.00–0.07)
Basophils Absolute: 0 10*3/uL (ref 0.0–0.1)
Basophils Relative: 1 %
Eosinophils Absolute: 0.1 10*3/uL (ref 0.0–0.5)
Eosinophils Relative: 1 %
HCT: 39.7 % (ref 36.0–46.0)
Hemoglobin: 12.9 g/dL (ref 12.0–15.0)
Immature Granulocytes: 0 %
Lymphocytes Relative: 32 %
Lymphs Abs: 1.6 10*3/uL (ref 0.7–4.0)
MCH: 32.3 pg (ref 26.0–34.0)
MCHC: 32.5 g/dL (ref 30.0–36.0)
MCV: 99.3 fL (ref 80.0–100.0)
Monocytes Absolute: 0.4 10*3/uL (ref 0.1–1.0)
Monocytes Relative: 7 %
Neutro Abs: 2.9 10*3/uL (ref 1.7–7.7)
Neutrophils Relative %: 59 %
Platelets: 172 10*3/uL (ref 150–400)
RBC: 4 MIL/uL (ref 3.87–5.11)
RDW: 12.6 % (ref 11.5–15.5)
WBC: 4.9 10*3/uL (ref 4.0–10.5)
nRBC: 0 % (ref 0.0–0.2)

## 2022-04-04 LAB — LACTATE DEHYDROGENASE: LDH: 110 U/L (ref 98–192)

## 2022-04-04 NOTE — Telephone Encounter (Signed)
Called [facility] to have pt scheduled for MRI of breats, Transferred to [facility] by Lenna Sciara to have it scheduled, LVM with order request and req to contact pt to be scheduled

## 2022-04-04 NOTE — Assessment & Plan Note (Signed)
#  Left kidney cancer status post nephrectomy. CT images are reviewed and discussed with patient Repeat CT without contrast in 6 months-patient has CKD.

## 2022-04-04 NOTE — Progress Notes (Signed)
Hematology/Oncology Progress note [phone] [phone]      Patient Care Team: Rusty Aus, MD as PCP - General (Internal Medicine)  ASSESSMENT & PLAN:   Cancer Staging  Malignant neoplasm of left kidney [facility]) Staging form: Kidney, AJCC 8th Edition - Pathologic stage from 05/24/2021: Stage I (pT1b, pN0, cM0) - Signed by Earlie Server, MD on 05/24/2021   Malignant neoplasm of left kidney (HCC) #Left kidney cancer status post nephrectomy. CT images are reviewed and discussed with patient Repeat CT without contrast in 6 months-patient has CKD.  Lung nodule Small lung nodules.  Observation.  CKD (chronic kidney disease) stage 3, GFR 30-59 ml/min (HCC) Due to nephrectomy. Encourage oral hydration.  Avoid nephrotoxins.   Orders Placed This Encounter  Procedures   CT CHEST ABDOMEN PELVIS WO CONTRAST    To be done in 6 months,  a few days prior to appt with D. Tasia Catchings    Standing Status:   Future    Standing Expiration Date:   04/05/2023    Order Specific Question:   If indicated for the ordered procedure, I authorize the administration of contrast media per Radiology protocol    Answer:   Yes    Order Specific Question:   Preferred imaging location?    Answer:   [facility]    Order Specific Question:   Is Oral Contrast requested for this exam?    Answer:   Yes, Per Radiology protocol   MR BREAST BILATERAL W [place] CAD    Standing Status:   Future    Standing Expiration Date:   04/05/2023    Order Specific Question:   If indicated for the ordered procedure, I authorize the administration of contrast media per Radiology protocol    Answer:   Yes    Order Specific Question:   What is the patient's sedation requirement?    Answer:   No Sedation    Order Specific Question:   Does the patient have a pacemaker or implanted devices?    Answer:   No    Order Specific Question:   Call Results- Best Contact Number?    Answer:   [id number]+, strong family  history of cancer, high risk breast cancer    Order Specific Question:   Radiology Contrast Protocol - do NOT remove file path    Answer:   [email or web address]    Order Specific Question:   Preferred imaging location?    Answer:   [facility] (table limit - 550lbs)   CBC with Differential/Platelet    Standing Status:   Future    Standing Expiration Date:   04/05/2023   Comprehensive metabolic panel    Standing Status:   Future    Standing Expiration Date:   04/04/2023   Lactate dehydrogenase    Standing Status:   Future    Standing Expiration Date:   04/05/2023    All questions were answered. The patient knows to call the clinic with any problems, questions or concerns.  Earlie Server, MD, PhD [facility] Hematology Oncology 04/04/2022   CHIEF COMPLAINTS/REASON FOR VISIT:  High risk for development of breast cancer, history of kidney cancer  HISTORY OF PRESENTING ILLNESS:   Carla Fuentes is a  66 y.o.  female presents for heterozygous CHEK2 mutation, history of kidney cancer,  Patient reports that her brother has been sick for the past few years due to unintentional weight loss and was eventually diagnosed with gastric cancer and passed away.  Brother told patient " to check breast cancer gene". She mentioned to gynecologist and was referred to genetic counselor for testing.    08/10/2020, patient had a genetic testing was tested positive for heterozygous CHEK2 Mother was adopted. Family history is positive for brother with stomach cancer, paternal aunt ovarian cancer, another paternal aunt cervical cancer, paternal uncle cancer with unknown details, paternal cousin pancreatic cancer, another cousin with blood cancer, second paternal cousin lung cancer, niece thyroid cancer  Oncology History Overview Note  RCC was managed by her Research scientist (medical).    Malignant neoplasm of left kidney ([place])  11/21/2020 Imaging    CT abdomen pelvis wo contrast  Intermediate attenuation mass at upper pole of LEFT kidney 4.7 x 4.6 x 4.6 cm    11/28/2020 Imaging   MRI abdomen w wo contrast: heterogeneously contrast enhancing, partially exophytic mass of the lateral superior pole of the left kidney measuring 4.7 x 4.6 cm, consistent with renal cell carcinoma. Although not definitive, appearance of the left renal vein is suspicious for renal vein invasion   12/05/2020 Initial Diagnosis   Malignant neoplasm of left kidney ([place])  01/18/2021 left nephrectomy and adrenalectomy, left perihilar lymphadenectomy.  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  Benign adrenal gland.  1 lymph node was examined and was negative for carcinoma. pT1b pN0    05/24/2021 Cancer Staging   Staging form: Kidney, AJCC 8th Edition - Pathologic stage from 05/24/2021: Stage I (pT1b, pN0, cM0) - Signed by Earlie Server, MD on 05/24/2021 Stage prefix: Initial diagnosis   08/18/2021 Imaging    CT RCC protocol abdomen pelvis with and without contrast at [facility] showed status post left nephrectomy without evidence of recurrence or metastatic disease.  Nonspecific left inguinal lymphadenopathy.  Attention on follow-up.  Left ovarian cyst can be assessed for stability on follow-up imaging.     08/21/2021 Procedure    patient had hysteroscopy, D&C, benign endometrial biopsy.   02/28/2022 Imaging   CT abdomen pelvis without contrast showed 1. Status post left nephrectomy without evidence of local recurrence. 2. No evidence of metastatic disease in the abdomen or pelvis. 3. Right middle lobe 4 mm pulmonary nodule is nonspecific, and was not seen on recent prior imaging studies although this appears to be outside of the few field of view on those examinations. Suggest follow-up dedicated chest CT for further evaluation. 4. Cystic left ovarian lesions previously evaluated by pelvic ultrasound June 06, 2021 and characterized as 3 adjacent renal cysts measuring up to 3.6 cm appear unchanged from  CT November 21, 2020. 5. Sigmoid colonic diverticulosis without findings of acute diverticulitis   03/13/2022 Imaging   CT chest with contrast 1. 4 mm right middle lobe pulmonary nodule is stable in the interval since prior abdomen/pelvis CT and also comparing back to remote chest CT of 05/18/2011. No followup imaging is recommended. 2. 3 mm subpleural left upper lobe pulmonary nodule is new, likely benign. Although likely benign, a non-contrast chest CT can be considered in 12 months.      09/04/2021, seen by cardiology for family history of hypertrophic cardiomyopathy. 09/11/2021, bilateral breast MRI with and without contrast showed no evidence of breast malignancy.  INTERVAL HISTORY Carla Fuentes is a 66 y.o. female who has above history reviewed by me today presents for follow up visit for management of  At the risk of developing breast cancer, history of left renal cell carcinoma. She reports feeling well. No new complaints.    Review of Systems  Constitutional:  Negative for appetite change, chills, fatigue and fever.  HENT:   Negative for hearing loss and voice change.   Eyes:  Negative for eye problems.  Respiratory:  Negative for chest tightness and cough.   Cardiovascular:  Negative for chest pain.  Gastrointestinal:  Negative for abdominal distention, abdominal pain and blood in stool.  Endocrine: Negative for hot flashes.  Genitourinary:  Negative for difficulty urinating and frequency.   Musculoskeletal:  Negative for arthralgias.  Skin:  Negative for itching and rash.  Neurological:  Negative for extremity weakness.  Hematological:  Negative for adenopathy.  Psychiatric/Behavioral:  Negative for confusion.     MEDICAL HISTORY:  Past Medical History:  Diagnosis Date   Abnormal Pap smear    Anxiety    Basal cell adenocarcinoma    skin, face   Colitis 01/05/15   treated wtih antibiotics   Depression    Family history of lung cancer    Family history of ovarian cancer     Family history of pancreatic cancer    Family history of stomach cancer    Family history of thyroid cancer    GERD (gastroesophageal reflux disease)    Hypertension    started on medication for LEE   Positive test for genetic breast cancer susceptibility marker    CHEK2+ gene   TIA (transient ischemic attack)    initially question as TIA, evaluation negative.  Neurologist felt this was anxiety related.    SURGICAL HISTORY: Past Surgical History:  Procedure Laterality Date   CERVICAL BIOPSY  W/ LOOP ELECTRODE EXCISION     CESAREAN SECTION     2, FTP with fetal distress   COLONOSCOPY  01/2015   repeat 5 years   COLONOSCOPY WITH PROPOFOL N/A 05/30/2020   Procedure: COLONOSCOPY WITH PROPOFOL;  Surgeon: Benjamine Sprague, DO;  Location: [facility] ENDOSCOPY;  Service: General;  Laterality: N/A;   ESOPHAGOGASTRODUODENOSCOPY N/A 05/30/2020   Procedure: ESOPHAGOGASTRODUODENOSCOPY (EGD);  Surgeon: Benjamine Sprague, DO;  Location: [facility];  Service: General;  Laterality: N/A;   HEMORRHOID BANDING  11/2014   Dr. Tamala Julian, [place]  2001   for recurrent ovarian cyst    SOCIAL HISTORY: Social History   Socioeconomic History   Marital status: Married    Spouse name: Not on file   Number of children: Not on file   Years of education: Not on file   Highest education level: Not on file  Occupational History    Employer: SELF EMPLOYED    Comment: [employer]  Tobacco Use   Smoking status: Never   Smokeless tobacco: Never  Vaping Use   Vaping Use: Never used  Substance and Sexual Activity   Alcohol use: Yes    Alcohol/week: 0.0 - 1.0 standard drinks of alcohol   Drug use: No   Sexual activity: Yes    Partners: Male    Birth control/protection: Post-menopausal    Comment: husband vasectomy  Other Topics Concern   Not on file  Social History Narrative   Lives with husband, remarried. 2 children, 33YO and 35YO, one child lives [place].      Work - Chartered certified accountant      Diet - regular diet      Exercise - works out 3-4 times per week   Social Determinants of Radio broadcast assistant Strain: Not on [employer] Insecurity: Not on file  Transportation Needs: Not on file  Physical Activity: Not on file  Stress: Not on file  Social Connections: Not on file  Intimate Partner Violence: Not on file    FAMILY HISTORY: Family History  Problem Relation Age of Onset   Diabetes Mother    Hypertension Mother    Heart disease Father    Diabetes Brother    Stomach cancer Brother    Ovarian cancer Paternal Aunt    Cancer Paternal Uncle        unk type   Cervical cancer Paternal Aunt    Pancreatic cancer Cousin        d. 23s   Cancer Cousin        blood cancer   Other Cousin        d. brain tumor 30s/40s   Lung cancer Cousin        x2; hx smoking   Cancer Cousin        metastatic   Thyroid cancer Niece        dx 53s, neg. GT    ALLERGIES:  is allergic to penicillins.  MEDICATIONS:  Current Outpatient Medications  Medication Sig Dispense Refill   ALPRAZolam (XANAX) 0.25 MG tablet Take 1 tablet (0.25 mg total) by mouth 3 (three) times daily as needed for sleep or anxiety (Take 1 tablet by mouth every 8 hours as needed for anxiety). 30 tablet 1   CRANBERRY PO Take by mouth.     FLUoxetine (PROZAC) 40 MG capsule Take 40 mg by mouth daily.     furosemide (LASIX) 20 MG tablet Take 20 mg by mouth daily.      losartan (COZAAR) 50 MG tablet Take 50 mg by mouth daily.     MAGNESIUM PO Take by mouth daily.     Multiple Vitamin (MULTIVITAMIN) capsule Take 1 capsule by mouth daily.     temazepam (RESTORIL) 30 MG capsule TAKE 1 CAPSULE AT BEDTIME AS NEEDED INSOMNIA 30 capsule 5   TURMERIC PO Take by mouth.     No current facility-administered medications for this visit.     PHYSICAL EXAMINATION: ECOG PERFORMANCE STATUS: 0 - Asymptomatic Vitals:   04/04/22 1411  BP: 130/76  Pulse: 72  Resp: 18  Temp: 99 F (37.2 C)   Filed  Weights    Physical Exam Constitutional:      General: She is not in acute distress. HENT:     Head: Normocephalic and atraumatic.  Eyes:     General: No scleral icterus. Cardiovascular:     Rate and Rhythm: Normal rate and regular rhythm.     Heart sounds: Normal heart sounds.  Pulmonary:     Effort: Pulmonary effort is normal. No respiratory distress.     Breath sounds: No wheezing.  Abdominal:     General: Bowel sounds are normal. There is no distension.     Palpations: Abdomen is soft.  Musculoskeletal:        General: No deformity. Normal range of motion.     Cervical back: Normal range of motion and neck supple.  Skin:    General: Skin is warm and dry.     Findings: No erythema or rash.  Neurological:     Mental Status: She is alert and oriented to person, place, and time. Mental status is at baseline.     Cranial Nerves: No cranial nerve deficit.  Psychiatric:     Comments: Anxious     LABORATORY DATA:  I have reviewed the data as listed Lab Results  Component Value Date   WBC 4.9  04/04/2022   HGB 12.9 04/04/2022   HCT 39.7 04/04/2022   MCV 99.3 04/04/2022   PLT 172 04/04/2022   Recent Labs    03/13/22 0819 04/04/22 1357  NA  --  138  K  --  4.3  CL  --  104  CO2  --  27  GLUCOSE  --  94  BUN  --  25*  CREATININE 1.30* 1.34*  CALCIUM  --  9.2  GFRNONAA  --  43*  PROT  --  7.1  ALBUMIN  --  3.9  AST  --  20  ALT  --  17  ALKPHOS  --  63  BILITOT  --  0.5   Iron/TIBC/Ferritin/ %Sat No results found for: "IRON", "TIBC", "FERRITIN", "IRONPCTSAT"    RADIOGRAPHIC STUDIES: I have personally reviewed the radiological images as listed and agreed with the findings in the report. CT CHEST W CONTRAST  Result Date: 03/16/2022 CLINICAL DATA:  Pulmonary nodule. History of kidney cancer. * Tracking Code: BO * EXAM: CT CHEST WITH CONTRAST TECHNIQUE: Multidetector CT imaging of the chest was performed during intravenous contrast administration. RADIATION  DOSE REDUCTION: This exam was performed according to the departmental dose-optimization program which includes automated exposure control, adjustment of the mA and/or kV according to patient size and/or use of iterative reconstruction technique. CONTRAST:  14m OMNIPAQUE IOHEXOL 300 MG/ML  SOLN COMPARISON:  Abdomen/pelvis CT 02/27/2022.  Chest CT 05/18/2011. FINDINGS: Cardiovascular: The heart size is normal. No substantial pericardial effusion. Mild atherosclerotic calcification is noted in the wall of the thoracic aorta. Ascending thoracic aorta measures 3.9 cm diameter. Mediastinum/Nodes: No mediastinal lymphadenopathy. There is no hilar lymphadenopathy. The esophagus has normal imaging features. There is no axillary lymphadenopathy. Lungs/Pleura: 4 mm right middle lobe pulmonary nodule is stable in the interval since prior abdomen/pelvis CT and also comparing back to remote chest CT of 05/18/2011. 3 mm subpleural left upper lobe pulmonary nodule on 43/3 is new, likely benign. No suspicious pulmonary nodule or mass. No focal airspace consolidation. No pleural effusion. Upper Abdomen: Left nephrectomy.  Otherwise unremarkable. Musculoskeletal: No worrisome lytic or sclerotic osseous abnormality. IMPRESSION: 1. 4 mm right middle lobe pulmonary nodule is stable in the interval since prior abdomen/pelvis CT and also comparing back to remote chest CT of 05/18/2011. No followup imaging is recommended. 2. 3 mm subpleural left upper lobe pulmonary nodule is new, likely benign. Although likely benign, a non-contrast chest CT can be considered in 12 months.This recommendation follows the consensus statement: Guidelines for Management of Incidental Pulmonary Nodules Detected on CT Images: From the Fleischner Society 2017; Radiology 2017; 284:228-243. 3.  Aortic Atherosclerosis (ICD10-I70.0). Electronically Signed   By: EMisty StanleyM.D.   On: 03/16/2022 06:07

## 2022-04-04 NOTE — Assessment & Plan Note (Signed)
Small lung nodules.  Observation.

## 2022-04-04 NOTE — Progress Notes (Signed)
Pt here for follow up.

## 2022-04-04 NOTE — Assessment & Plan Note (Signed)
Due to nephrectomy. Encourage oral hydration.  Avoid nephrotoxins.

## 2022-05-02 DIAGNOSIS — R739 Hyperglycemia, unspecified: Secondary | ICD-10-CM | POA: Diagnosis not present

## 2022-05-02 DIAGNOSIS — Z79899 Other long term (current) drug therapy: Secondary | ICD-10-CM | POA: Diagnosis not present

## 2022-05-09 DIAGNOSIS — F330 Major depressive disorder, recurrent, mild: Secondary | ICD-10-CM | POA: Diagnosis not present

## 2022-05-09 DIAGNOSIS — C642 Malignant neoplasm of left kidney, except renal pelvis: Secondary | ICD-10-CM | POA: Diagnosis not present

## 2022-05-09 DIAGNOSIS — N1831 Chronic kidney disease, stage 3a: Secondary | ICD-10-CM | POA: Diagnosis not present

## 2022-05-09 DIAGNOSIS — R911 Solitary pulmonary nodule: Secondary | ICD-10-CM | POA: Diagnosis not present

## 2022-05-09 DIAGNOSIS — Z0000 Encounter for general adult medical examination without abnormal findings: Secondary | ICD-10-CM | POA: Diagnosis not present

## 2022-05-21 ENCOUNTER — Ambulatory Visit
Admission: RE | Admit: 2022-05-21 | Discharge: 2022-05-21 | Disposition: A | Discharge status: Home / Self Care | Payer: PPO | Source: Ambulatory Visit | Attending: Oncology | Admitting: Oncology

## 2022-05-21 DIAGNOSIS — Z1231 Encounter for screening mammogram for malignant neoplasm of breast: Secondary | ICD-10-CM | POA: Diagnosis not present

## 2022-05-21 DIAGNOSIS — Z1509 Genetic susceptibility to other malignant neoplasm: Secondary | ICD-10-CM | POA: Diagnosis not present

## 2022-05-21 DIAGNOSIS — Z1502 Genetic susceptibility to malignant neoplasm of ovary: Secondary | ICD-10-CM | POA: Diagnosis not present

## 2022-05-21 DIAGNOSIS — Z1501 Genetic susceptibility to malignant neoplasm of breast: Secondary | ICD-10-CM | POA: Diagnosis not present

## 2022-05-21 DIAGNOSIS — Z1589 Genetic susceptibility to other disease: Secondary | ICD-10-CM | POA: Insufficient documentation

## 2022-07-24 DIAGNOSIS — R5383 Other fatigue: Secondary | ICD-10-CM | POA: Diagnosis not present

## 2022-07-24 DIAGNOSIS — R140 Abdominal distension (gaseous): Secondary | ICD-10-CM | POA: Diagnosis not present

## 2022-07-24 DIAGNOSIS — R195 Other fecal abnormalities: Secondary | ICD-10-CM | POA: Diagnosis not present

## 2022-07-24 DIAGNOSIS — R1084 Generalized abdominal pain: Secondary | ICD-10-CM | POA: Diagnosis not present

## 2022-07-24 DIAGNOSIS — R6881 Early satiety: Secondary | ICD-10-CM | POA: Diagnosis not present

## 2022-07-27 DIAGNOSIS — D2271 Melanocytic nevi of right lower limb, including hip: Secondary | ICD-10-CM | POA: Diagnosis not present

## 2022-07-27 DIAGNOSIS — D2272 Melanocytic nevi of left lower limb, including hip: Secondary | ICD-10-CM | POA: Diagnosis not present

## 2022-07-27 DIAGNOSIS — D2261 Melanocytic nevi of right upper limb, including shoulder: Secondary | ICD-10-CM | POA: Diagnosis not present

## 2022-07-27 DIAGNOSIS — D225 Melanocytic nevi of trunk: Secondary | ICD-10-CM | POA: Diagnosis not present

## 2022-07-27 DIAGNOSIS — L728 Other follicular cysts of the skin and subcutaneous tissue: Secondary | ICD-10-CM | POA: Diagnosis not present

## 2022-07-27 DIAGNOSIS — L821 Other seborrheic keratosis: Secondary | ICD-10-CM | POA: Diagnosis not present

## 2022-07-27 DIAGNOSIS — D2262 Melanocytic nevi of left upper limb, including shoulder: Secondary | ICD-10-CM | POA: Diagnosis not present

## 2022-08-01 DIAGNOSIS — R195 Other fecal abnormalities: Secondary | ICD-10-CM | POA: Diagnosis not present

## 2022-08-01 DIAGNOSIS — R140 Abdominal distension (gaseous): Secondary | ICD-10-CM | POA: Diagnosis not present

## 2022-08-07 ENCOUNTER — Encounter (HOSPITAL_BASED_OUTPATIENT_CLINIC_OR_DEPARTMENT_OTHER): Payer: Self-pay | Admitting: Obstetrics & Gynecology

## 2022-08-07 ENCOUNTER — Encounter: Payer: Self-pay | Admitting: Oncology

## 2022-08-08 NOTE — Telephone Encounter (Signed)
Please contact pt to set up appt (MD only) in 1 -2 weeks. Thanks

## 2022-08-24 ENCOUNTER — Inpatient Hospital Stay: Payer: PPO | Attending: Oncology | Admitting: Oncology

## 2022-08-24 ENCOUNTER — Encounter: Payer: Self-pay | Admitting: Oncology

## 2022-08-24 VITALS — BP 139/85 | HR 79 | Temp 97.0°F | Resp 17

## 2022-08-24 DIAGNOSIS — Z1509 Genetic susceptibility to other malignant neoplasm: Secondary | ICD-10-CM | POA: Diagnosis not present

## 2022-08-24 DIAGNOSIS — C642 Malignant neoplasm of left kidney, except renal pelvis: Secondary | ICD-10-CM | POA: Diagnosis not present

## 2022-08-24 DIAGNOSIS — R911 Solitary pulmonary nodule: Secondary | ICD-10-CM | POA: Diagnosis not present

## 2022-08-24 DIAGNOSIS — Z1502 Genetic susceptibility to malignant neoplasm of ovary: Secondary | ICD-10-CM | POA: Diagnosis not present

## 2022-08-24 DIAGNOSIS — Z905 Acquired absence of kidney: Secondary | ICD-10-CM | POA: Diagnosis not present

## 2022-08-24 DIAGNOSIS — Z1501 Genetic susceptibility to malignant neoplasm of breast: Secondary | ICD-10-CM

## 2022-08-24 DIAGNOSIS — Z85528 Personal history of other malignant neoplasm of kidney: Secondary | ICD-10-CM | POA: Insufficient documentation

## 2022-08-24 DIAGNOSIS — Z1589 Genetic susceptibility to other disease: Secondary | ICD-10-CM

## 2022-08-24 NOTE — Assessment & Plan Note (Signed)
Recommend annual mammogram with annual breast MRI Patient has started colon cancer screening.  Recommend colonoscopy every 5 years

## 2022-08-24 NOTE — Assessment & Plan Note (Addendum)
#  Left kidney cancer status post nephrectomy. Repeat CT without contrast in 6 months-avoid contrast if possible due to that fact that she only has one kidney and has CKD stage III.

## 2022-08-24 NOTE — Progress Notes (Signed)
Patient here for oncology follow-up appointment, concerns of Left Rib pain

## 2022-08-24 NOTE — Assessment & Plan Note (Addendum)
Small lung nodules.  Observation. Check CT wo contrast

## 2022-08-24 NOTE — Progress Notes (Signed)
Hematology/Oncology Progress note [phone] [phone]      Patient Care Team: Danella Penton, MD as PCP - General (Internal Medicine)  ASSESSMENT & PLAN:   Cancer Staging  Malignant neoplasm of left kidney Staging form: Kidney, AJCC 8th Edition - Pathologic stage from 05/24/2021: Stage I (pT1b, pN0, cM0) - Signed by Rickard Patience, MD on 05/24/2021   Malignant neoplasm of left kidney #Left kidney cancer status post nephrectomy. Repeat CT without contrast in 6 months-avoid contrast if possible due to that fact that she only has one kidney and has CKD stage III.  Lung nodule Small lung nodules.  Observation. Check CT wo contrast  Monoallelic mutation of CHEK2 gene in female patient Recommend annual mammogram with annual breast MRI Patient has started colon cancer screening.  Recommend colonoscopy every 5 years   Orders Placed This Encounter  Procedures   CBC with Differential (Cancer Center Only)    Standing Status:   Future    Standing Expiration Date:   08/24/2023   CMP (Cancer Center only)    Standing Status:   Future    Standing Expiration Date:   08/24/2023   Lactate dehydrogenase    Standing Status:   Future    Standing Expiration Date:   08/24/2023   Follow up in 3 months.  All questions were answered. The patient knows to call the clinic with any problems, questions or concerns.  Rickard Patience, MD, PhD [facility] Hematology Oncology 08/24/2022   CHIEF COMPLAINTS/REASON FOR VISIT:  High risk for development of breast cancer, history of kidney cancer  HISTORY OF PRESENTING ILLNESS:   Carla Fuentes is a  67 y.o.  female presents for heterozygous CHEK2 mutation, history of kidney cancer,  Patient reports that her brother has been sick for the past few years due to unintentional weight loss and was eventually diagnosed with gastric cancer and passed away.  Brother told patient " to check breast cancer gene". She mentioned to gynecologist and was  referred to genetic counselor for testing.    08/10/2020, patient had a genetic testing was tested positive for heterozygous CHEK2 Mother was adopted. Family history is positive for brother with stomach cancer, paternal aunt ovarian cancer, another paternal aunt cervical cancer, paternal uncle cancer with unknown details, paternal cousin pancreatic cancer, another cousin with blood cancer, second paternal cousin lung cancer, niece thyroid cancer  Oncology History Overview Note  RCC was managed by her Energy manager.    Malignant neoplasm of left kidney  11/21/2020 Imaging    CT abdomen pelvis wo contrast Intermediate attenuation mass at upper pole of LEFT kidney 4.7 x 4.6 x 4.6 cm    11/28/2020 Imaging   MRI abdomen w wo contrast: heterogeneously contrast enhancing, partially exophytic mass of the lateral superior pole of the left kidney measuring 4.7 x 4.6 cm, consistent with renal cell carcinoma. Although not definitive, appearance of the left renal vein is suspicious for renal vein invasion   12/05/2020 Initial Diagnosis   Malignant neoplasm of left kidney (HCC)  01/18/2021 left nephrectomy and adrenalectomy, left perihilar lymphadenectomy.  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  Benign adrenal gland.  1 lymph node was examined and was negative for carcinoma. pT1b pN0    05/24/2021 Cancer Staging   Staging form: Kidney, AJCC 8th Edition - Pathologic stage from 05/24/2021: Stage I (pT1b, pN0, cM0) - Signed by Rickard Patience, MD on 05/24/2021 Stage prefix: Initial diagnosis   08/18/2021 Imaging    CT RCC protocol  abdomen pelvis with and without contrast at [facility] showed status post left nephrectomy without evidence of recurrence or metastatic disease.  Nonspecific left inguinal lymphadenopathy.  Attention on follow-up.  Left ovarian cyst can be assessed for stability on follow-up imaging.     08/21/2021 Procedure    patient had hysteroscopy, D&C, benign endometrial biopsy.   02/28/2022  Imaging   CT abdomen pelvis without contrast showed 1. Status post left nephrectomy without evidence of local recurrence. 2. No evidence of metastatic disease in the abdomen or pelvis. 3. Right middle lobe 4 mm pulmonary nodule is nonspecific, and was not seen on recent prior imaging studies although this appears to be outside of the few field of view on those examinations. Suggest follow-up dedicated chest CT for further evaluation. 4. Cystic left ovarian lesions previously evaluated by pelvic ultrasound June 06, 2021 and characterized as 3 adjacent renal cysts measuring up to 3.6 cm appear unchanged from CT November 21, 2020. 5. Sigmoid colonic diverticulosis without findings of acute diverticulitis   03/13/2022 Imaging   CT chest with contrast 1. 4 mm right middle lobe pulmonary nodule is stable in the interval since prior abdomen/pelvis CT and also comparing back to remote chest CT of 05/18/2011. No followup imaging is recommended. 2. 3 mm subpleural left upper lobe pulmonary nodule is new, likely benign. Although likely benign, a non-contrast chest CT can be considered in 12 months.      09/04/2021, seen by cardiology for family history of hypertrophic cardiomyopathy. 09/11/2021, bilateral breast MRI with and without contrast showed no evidence of breast malignancy.  INTERVAL HISTORY Carla Fuentes is a 67 y.o. female who has above history reviewed by me today presents for follow up visit for management of  At the risk of developing breast cancer, history of left renal cell carcinoma. She has experienced left side abdomen pressure and loose stool, was recently seen by GI, and was felt to be due to constipation with overflow diarrhea. She was recommended to take Miralax. Her symptoms improved. She is scheduled to repeat EGD and colonoscopy    Review of Systems  Constitutional:  Negative for appetite change, chills, fatigue and fever.  HENT:   Negative for hearing loss and voice change.    Eyes:  Negative for eye problems.  Respiratory:  Negative for chest tightness and cough.   Cardiovascular:  Negative for chest pain.  Gastrointestinal:  Negative for abdominal distention, abdominal pain and blood in stool.       Bloating, diarrhea, left abdomen pressure  Endocrine: Negative for hot flashes.  Genitourinary:  Negative for difficulty urinating and frequency.   Musculoskeletal:  Negative for arthralgias.  Skin:  Negative for itching and rash.  Neurological:  Negative for extremity weakness.  Hematological:  Negative for adenopathy.  Psychiatric/Behavioral:  Negative for confusion.     MEDICAL HISTORY:  Past Medical History:  Diagnosis Date   Abnormal Pap smear    Anxiety    Basal cell adenocarcinoma    skin, face   Colitis 01/05/15   treated wtih antibiotics   Depression    Family history of lung cancer    Family history of ovarian cancer    Family history of pancreatic cancer    Family history of stomach cancer    Family history of thyroid cancer    GERD (gastroesophageal reflux disease)    Hypertension    started on medication for LEE   Positive test for genetic breast cancer susceptibility marker    CHEK2+  gene   TIA (transient ischemic attack)    initially question as TIA, evaluation negative.  Neurologist felt this was anxiety related.    SURGICAL HISTORY: Past Surgical History:  Procedure Laterality Date   CERVICAL BIOPSY  W/ LOOP ELECTRODE EXCISION     CESAREAN SECTION     2, FTP with fetal distress   COLONOSCOPY  01/2015   repeat 5 years   COLONOSCOPY WITH PROPOFOL N/A 05/30/2020   Procedure: COLONOSCOPY WITH PROPOFOL;  Surgeon: Sung Amabile, DO;  Location: [facility] ENDOSCOPY;  Service: General;  Laterality: N/A;   ESOPHAGOGASTRODUODENOSCOPY N/A 05/30/2020   Procedure: ESOPHAGOGASTRODUODENOSCOPY (EGD);  Surgeon: Sung Amabile, DO;  Location: [facility] ENDOSCOPY;  Service: General;  Laterality: N/A;   HEMORRHOID BANDING  11/2014   Dr. Katrinka Blazing, [place]    OOPHORECTOMY  2001   for recurrent ovarian cyst    SOCIAL HISTORY: Social History   Socioeconomic History   Marital status: Married    Spouse name: Not on file   Number of children: Not on file   Years of education: Not on file   Highest education level: Not on file  Occupational History    Employer: SELF EMPLOYED    Comment: [employer]  Tobacco Use   Smoking status: Never   Smokeless tobacco: Never  Vaping Use   Vaping Use: Never used  Substance and Sexual Activity   Alcohol use: Yes    Alcohol/week: 0.0 - 1.0 standard drinks of alcohol   Drug use: No   Sexual activity: Yes    Partners: Male    Birth control/protection: Post-menopausal    Comment: husband vasectomy  Other Topics Concern   Not on file  Social History Narrative   Lives with husband, remarried. 2 children, 33YO and 35YO, one child lives [place].      Work - Physicist, medical      Diet - regular diet      Exercise - works out 3-4 times per week   Social Determinants of Corporate investment banker Strain: Not on [employer] Insecurity: Not on file  Transportation Needs: Not on file  Physical Activity: Not on file  Stress: Not on file  Social Connections: Not on file  Intimate Partner Violence: Not on file    FAMILY HISTORY: Family History  Problem Relation Age of Onset   Diabetes Mother    Hypertension Mother    Heart disease Father    Diabetes Brother    Stomach cancer Brother    Ovarian cancer Paternal Aunt    Cancer Paternal Uncle        unk type   Cervical cancer Paternal Aunt    Pancreatic cancer Cousin        d. 45s   Cancer Cousin        blood cancer   Other Cousin        d. brain tumor 30s/40s   Lung cancer Cousin        x2; hx smoking   Cancer Cousin        metastatic   Thyroid cancer Niece        dx 30s, neg. GT    ALLERGIES:  is allergic to penicillins.  MEDICATIONS:  Current Outpatient Medications  Medication Sig Dispense Refill   ALPRAZolam  (XANAX) 0.25 MG tablet Take 1 tablet (0.25 mg total) by mouth 3 (three) times daily as needed for sleep or anxiety (Take 1 tablet by mouth every 8 hours as needed for  anxiety). 30 tablet 1   CRANBERRY PO Take by mouth.     FLUoxetine (PROZAC) 40 MG capsule Take 40 mg by mouth daily.     losartan (COZAAR) 50 MG tablet Take 50 mg by mouth daily.     MAGNESIUM PO Take by mouth daily.     Multiple Vitamin (MULTIVITAMIN) capsule Take 1 capsule by mouth daily.     temazepam (RESTORIL) 30 MG capsule TAKE 1 CAPSULE AT BEDTIME AS NEEDED INSOMNIA 30 capsule 5   Cholecalciferol (VITAMIN D-1000 MAX ST) 25 MCG (1000 UT) tablet Take by mouth.     furosemide (LASIX) 20 MG tablet Take 20 mg by mouth daily.  (Patient not taking: Reported on 08/24/2022)     Omega-3 Fatty Acids (FISH OIL PEARLS) 183.33 MG CAPS Take by mouth.     pyridOXINE (B-6) 50 MG tablet Take by mouth.     TURMERIC PO Take by mouth. (Patient not taking: Reported on 08/24/2022)     No current facility-administered medications for this visit.     PHYSICAL EXAMINATION: ECOG PERFORMANCE STATUS: 0 - Asymptomatic Vitals:   08/24/22 1212  BP: 139/85  Pulse: 79  Resp: 17  Temp: (!) 97 F (36.1 C)  SpO2: 97%   Filed Weights    Physical Exam Constitutional:      General: She is not in acute distress. HENT:     Head: Normocephalic and atraumatic.  Eyes:     General: No scleral icterus. Cardiovascular:     Rate and Rhythm: Normal rate.  Pulmonary:     Effort: Pulmonary effort is normal. No respiratory distress.  Abdominal:     General: There is no distension.  Musculoskeletal:        General: No deformity. Normal range of motion.     Cervical back: Normal range of motion.  Skin:    Findings: No rash.  Neurological:     Mental Status: She is alert and oriented to person, place, and time. Mental status is at baseline.     Cranial Nerves: No cranial nerve deficit.  Psychiatric:     Comments: Anxious     LABORATORY DATA:  I  have reviewed the data as listed    Latest Ref Rng & Units 04/04/2022    1:57 PM 01/05/2015    1:20 PM 01/12/2014    2:39 PM  CBC  WBC 4.0 - 10.5 K/uL 4.9  11.6    Hemoglobin 12.0 - 15.0 g/dL 16.1  09.6  04.5   Hematocrit 36.0 - 46.0 % 39.7  42.0    Platelets 150 - 400 K/uL 172  207        Latest Ref Rng & Units 04/04/2022    1:57 PM 03/13/2022    8:19 AM 01/05/2015    1:20 PM  CMP  Glucose 70 - 99 mg/dL 94   95   BUN 8 - 23 mg/dL 25   17   Creatinine 4.09 - 1.00 mg/dL 8.11  9.14  7.82   Sodium 135 - 145 mmol/L 138   138   Potassium 3.5 - 5.1 mmol/L 4.3   3.3   Chloride 98 - 111 mmol/L 104   101   CO2 22 - 32 mmol/L 27   26   Calcium 8.9 - 10.3 mg/dL 9.2   9.2   Total Protein 6.5 - 8.1 g/dL 7.1   7.1   Total Bilirubin 0.3 - 1.2 mg/dL 0.5   0.7   Alkaline Phos 38 - 126  U/L 63   76   AST 15 - 41 U/L 20   24   ALT 0 - 44 U/L 17   20     RADIOGRAPHIC STUDIES: I have personally reviewed the radiological images as listed and agreed with the findings in the report. No results found.

## 2022-08-30 ENCOUNTER — Ambulatory Visit: Payer: PPO

## 2022-08-30 DIAGNOSIS — K5730 Diverticulosis of large intestine without perforation or abscess without bleeding: Secondary | ICD-10-CM | POA: Diagnosis not present

## 2022-08-30 DIAGNOSIS — K640 First degree hemorrhoids: Secondary | ICD-10-CM | POA: Diagnosis not present

## 2022-08-30 DIAGNOSIS — K2950 Unspecified chronic gastritis without bleeding: Secondary | ICD-10-CM | POA: Diagnosis not present

## 2022-08-30 DIAGNOSIS — R1013 Epigastric pain: Secondary | ICD-10-CM | POA: Diagnosis not present

## 2022-08-30 DIAGNOSIS — K2970 Gastritis, unspecified, without bleeding: Secondary | ICD-10-CM | POA: Diagnosis not present

## 2022-08-30 DIAGNOSIS — K2100 Gastro-esophageal reflux disease with esophagitis, without bleeding: Secondary | ICD-10-CM | POA: Diagnosis not present

## 2022-08-30 DIAGNOSIS — K529 Noninfective gastroenteritis and colitis, unspecified: Secondary | ICD-10-CM | POA: Diagnosis not present

## 2022-08-31 ENCOUNTER — Other Ambulatory Visit: Payer: Self-pay | Admitting: Internal Medicine

## 2022-08-31 DIAGNOSIS — R918 Other nonspecific abnormal finding of lung field: Secondary | ICD-10-CM

## 2022-09-04 DIAGNOSIS — M5416 Radiculopathy, lumbar region: Secondary | ICD-10-CM | POA: Diagnosis not present

## 2022-09-04 DIAGNOSIS — M5414 Radiculopathy, thoracic region: Secondary | ICD-10-CM | POA: Diagnosis not present

## 2022-09-04 DIAGNOSIS — M9903 Segmental and somatic dysfunction of lumbar region: Secondary | ICD-10-CM | POA: Diagnosis not present

## 2022-09-04 DIAGNOSIS — M9902 Segmental and somatic dysfunction of thoracic region: Secondary | ICD-10-CM | POA: Diagnosis not present

## 2022-09-06 DIAGNOSIS — M9902 Segmental and somatic dysfunction of thoracic region: Secondary | ICD-10-CM | POA: Diagnosis not present

## 2022-09-06 DIAGNOSIS — M9903 Segmental and somatic dysfunction of lumbar region: Secondary | ICD-10-CM | POA: Diagnosis not present

## 2022-09-06 DIAGNOSIS — M5414 Radiculopathy, thoracic region: Secondary | ICD-10-CM | POA: Diagnosis not present

## 2022-09-06 DIAGNOSIS — M5416 Radiculopathy, lumbar region: Secondary | ICD-10-CM | POA: Diagnosis not present

## 2022-09-10 DIAGNOSIS — M9902 Segmental and somatic dysfunction of thoracic region: Secondary | ICD-10-CM | POA: Diagnosis not present

## 2022-09-10 DIAGNOSIS — M5416 Radiculopathy, lumbar region: Secondary | ICD-10-CM | POA: Diagnosis not present

## 2022-09-10 DIAGNOSIS — M5414 Radiculopathy, thoracic region: Secondary | ICD-10-CM | POA: Diagnosis not present

## 2022-09-10 DIAGNOSIS — M9903 Segmental and somatic dysfunction of lumbar region: Secondary | ICD-10-CM | POA: Diagnosis not present

## 2022-09-12 DIAGNOSIS — M5416 Radiculopathy, lumbar region: Secondary | ICD-10-CM | POA: Diagnosis not present

## 2022-09-12 DIAGNOSIS — M9902 Segmental and somatic dysfunction of thoracic region: Secondary | ICD-10-CM | POA: Diagnosis not present

## 2022-09-12 DIAGNOSIS — M5414 Radiculopathy, thoracic region: Secondary | ICD-10-CM | POA: Diagnosis not present

## 2022-09-12 DIAGNOSIS — M9903 Segmental and somatic dysfunction of lumbar region: Secondary | ICD-10-CM | POA: Diagnosis not present

## 2022-09-24 ENCOUNTER — Ambulatory Visit (HOSPITAL_BASED_OUTPATIENT_CLINIC_OR_DEPARTMENT_OTHER): Payer: PPO | Admitting: Obstetrics & Gynecology

## 2022-09-25 ENCOUNTER — Ambulatory Visit
Admission: RE | Admit: 2022-09-25 | Discharge: 2022-09-25 | Disposition: A | Discharge status: Home / Self Care | Payer: PPO | Source: Ambulatory Visit | Attending: Oncology | Admitting: Oncology

## 2022-09-25 DIAGNOSIS — R911 Solitary pulmonary nodule: Secondary | ICD-10-CM

## 2022-09-25 DIAGNOSIS — R918 Other nonspecific abnormal finding of lung field: Secondary | ICD-10-CM | POA: Diagnosis not present

## 2022-09-25 DIAGNOSIS — Z85528 Personal history of other malignant neoplasm of kidney: Secondary | ICD-10-CM | POA: Insufficient documentation

## 2022-09-25 DIAGNOSIS — N289 Disorder of kidney and ureter, unspecified: Secondary | ICD-10-CM | POA: Diagnosis not present

## 2022-09-28 ENCOUNTER — Encounter: Payer: Self-pay | Admitting: Oncology

## 2022-10-03 ENCOUNTER — Other Ambulatory Visit: Payer: PPO

## 2022-10-03 ENCOUNTER — Ambulatory Visit: Payer: PPO | Admitting: Oncology

## 2022-10-05 ENCOUNTER — Encounter: Payer: Self-pay | Admitting: Oncology

## 2022-10-05 ENCOUNTER — Inpatient Hospital Stay: Payer: PPO | Attending: Oncology | Admitting: Oncology

## 2022-10-05 DIAGNOSIS — Z1501 Genetic susceptibility to malignant neoplasm of breast: Secondary | ICD-10-CM | POA: Diagnosis not present

## 2022-10-05 DIAGNOSIS — R911 Solitary pulmonary nodule: Secondary | ICD-10-CM

## 2022-10-05 DIAGNOSIS — C642 Malignant neoplasm of left kidney, except renal pelvis: Secondary | ICD-10-CM

## 2022-10-05 DIAGNOSIS — Z85528 Personal history of other malignant neoplasm of kidney: Secondary | ICD-10-CM | POA: Insufficient documentation

## 2022-10-05 DIAGNOSIS — Z1509 Genetic susceptibility to other malignant neoplasm: Secondary | ICD-10-CM

## 2022-10-05 DIAGNOSIS — Z1502 Genetic susceptibility to malignant neoplasm of ovary: Secondary | ICD-10-CM

## 2022-10-05 DIAGNOSIS — Z1589 Genetic susceptibility to other disease: Secondary | ICD-10-CM

## 2022-10-05 DIAGNOSIS — R918 Other nonspecific abnormal finding of lung field: Secondary | ICD-10-CM | POA: Insufficient documentation

## 2022-10-05 NOTE — Assessment & Plan Note (Addendum)
Recommend annual mammogram with annual breast MRI Patient has started colon cancer screening.  Recommend colonoscopy every 5 years Follow-up with endocrinology

## 2022-10-05 NOTE — Assessment & Plan Note (Signed)
Small lung nodules.  Observation. 

## 2022-10-05 NOTE — Assessment & Plan Note (Addendum)
#  Left kidney cancer status post nephrectomy. CT scan results were reviewed in details with patient. No signs of disease recurrence.  Stable and unchanged small lung nodules, kidney cyst, left adnexal cyst

## 2022-10-05 NOTE — Progress Notes (Signed)
Pt contacted for Mychart visit. Pt wanting to review CT results.

## 2022-10-05 NOTE — Progress Notes (Signed)
HEMATOLOGY-ONCOLOGY TeleHEALTH VISIT INITIAL CONSULTATION  I connected with Carla Fuentes on 10/05/22 at  9:30 AM EDT by video enabled telemedicine visit and verified that I am speaking with the correct person using two identifiers. I discussed the limitations, risks, security and privacy concerns of performing an evaluation and management service by telemedicine and the availability of in-person appointments. I also discussed with the patient that there may be a patient responsible charge related to this service. The patient expressed understanding and agreed to proceed.   Other persons participating in the visit and their role in the encounter:  None  Patient's location: Home  Provider's location: office  Referring provider: Danella Penton, MD  Chief Complaint: Follow-up to discuss CT results.  HISTORY OF PRESENT ILLNESS Carla Fuentes is a 67 y.o. female presents virtually to discuss CT results. Patient has no new complaints.   Review of Systems  Constitutional:  Negative for appetite change, chills, fatigue and fever.  HENT:   Negative for hearing loss and voice change.   Eyes:  Negative for eye problems.  Respiratory:  Negative for chest tightness and cough.   Cardiovascular:  Negative for chest pain.  Gastrointestinal:  Negative for abdominal distention, abdominal pain and blood in stool.  Endocrine: Negative for hot flashes.  Genitourinary:  Negative for difficulty urinating and frequency.   Musculoskeletal:  Negative for arthralgias.  Skin:  Negative for itching and rash.  Neurological:  Negative for extremity weakness.  Hematological:  Negative for adenopathy.  Psychiatric/Behavioral:  Negative for confusion.     Past Medical History:  Diagnosis Date   Abnormal Pap smear    Anxiety    Basal cell adenocarcinoma    skin, face   Colitis 01/05/15   treated wtih antibiotics   Depression    Family history of lung cancer    Family history of ovarian cancer    Family  history of pancreatic cancer    Family history of stomach cancer    Family history of thyroid cancer    GERD (gastroesophageal reflux disease)    Hypertension    started on medication for LEE   Positive test for genetic breast cancer susceptibility marker    CHEK2+ gene   TIA (transient ischemic attack)    initially question as TIA, evaluation negative.  Neurologist felt this was anxiety related.   Past Surgical History:  Procedure Laterality Date   CERVICAL BIOPSY  W/ LOOP ELECTRODE EXCISION     CESAREAN SECTION     2, FTP with fetal distress   COLONOSCOPY  01/2015   repeat 5 years   COLONOSCOPY WITH PROPOFOL N/A 05/30/2020   Procedure: COLONOSCOPY WITH PROPOFOL;  Surgeon: Sung Amabile, DO;  Location: [facility] ENDOSCOPY;  Service: General;  Laterality: N/A;   ESOPHAGOGASTRODUODENOSCOPY N/A 05/30/2020   Procedure: ESOPHAGOGASTRODUODENOSCOPY (EGD);  Surgeon: Sung Amabile, DO;  Location: [facility] ENDOSCOPY;  Service: General;  Laterality: N/A;   HEMORRHOID BANDING  11/2014   Dr. Katrinka Blazing, [place]   OOPHORECTOMY  2001   for recurrent ovarian cyst    Family History  Problem Relation Age of Onset   Diabetes Mother    Hypertension Mother    Heart disease Father    Diabetes Brother    Stomach cancer Brother    Ovarian cancer Paternal Aunt    Cancer Paternal Uncle        unk type   Cervical cancer Paternal Aunt    Pancreatic cancer Cousin        d.  60s   Cancer Cousin        blood cancer   Other Cousin        d. brain tumor 30s/40s   Lung cancer Cousin        x2; hx smoking   Cancer Cousin        metastatic   Thyroid cancer Niece        dx 30s, neg. GT    Social History   Socioeconomic History   Marital status: Married    Spouse name: Not on file   Number of children: Not on file   Years of education: Not on file   Highest education level: Not on file  Occupational History    Employer: SELF EMPLOYED    Comment: [employer]  Tobacco Use   Smoking status: Never    Smokeless tobacco: Never  Vaping Use   Vaping Use: Never used  Substance and Sexual Activity   Alcohol use: Yes    Alcohol/week: 0.0 - 1.0 standard drinks of alcohol   Drug use: No   Sexual activity: Yes    Partners: Male    Birth control/protection: Post-menopausal    Comment: husband vasectomy  Other Topics Concern   Not on file  Social History Narrative   Lives with husband, remarried. 2 children, 33YO and 35YO, one child lives [place].      Work - Physicist, medical      Diet - regular diet      Exercise - works out 3-4 times per week   Social Determinants of Corporate investment banker Strain: Not on [employer] Insecurity: Not on file  Transportation Needs: Not on file  Physical Activity: Not on file  Stress: Not on file  Social Connections: Not on file  Intimate Partner Violence: Not on file    Current Outpatient Medications on File Prior to Visit  Medication Sig Dispense Refill   ALPRAZolam (XANAX) 0.25 MG tablet Take 1 tablet (0.25 mg total) by mouth 3 (three) times daily as needed for sleep or anxiety (Take 1 tablet by mouth every 8 hours as needed for anxiety). 30 tablet 1   Cholecalciferol (VITAMIN D-1000 MAX ST) 25 MCG (1000 UT) tablet Take by mouth.     CRANBERRY PO Take by mouth.     FLUoxetine (PROZAC) 40 MG capsule Take 40 mg by mouth daily.     furosemide (LASIX) 20 MG tablet Take 20 mg by mouth daily.     losartan (COZAAR) 50 MG tablet Take 50 mg by mouth daily.     MAGNESIUM PO Take by mouth daily.     Multiple Vitamin (MULTIVITAMIN) capsule Take 1 capsule by mouth daily.     Omega-3 Fatty Acids (FISH OIL PEARLS) 183.33 MG CAPS Take by mouth.     pyridOXINE (B-6) 50 MG tablet Take by mouth.     temazepam (RESTORIL) 30 MG capsule TAKE 1 CAPSULE AT BEDTIME AS NEEDED INSOMNIA 30 capsule 5   No current facility-administered medications on file prior to visit.    Allergies  Allergen Reactions   Penicillins Rash        Observations/Objective: There were no vitals filed for this visit. There is no height or weight on file to calculate BMI.  Physical Exam Neurological:     Mental Status: She is alert.     I have personally reviewed below laboratory results.     Latest Ref Rng & Units 04/04/2022    1:57  PM 01/05/2015    1:20 PM 01/12/2014    2:39 PM  CBC  WBC 4.0 - 10.5 K/uL 4.9  11.6    Hemoglobin 12.0 - 15.0 g/dL 96.0  45.4  09.8   Hematocrit 36.0 - 46.0 % 39.7  42.0    Platelets 150 - 400 K/uL 172  207        Latest Ref Rng & Units 04/04/2022    1:57 PM 03/13/2022    8:19 AM 01/05/2015    1:20 PM  CMP  Glucose 70 - 99 mg/dL 94   95   BUN 8 - 23 mg/dL 25   17   Creatinine 1.19 - 1.00 mg/dL 1.47  8.29  5.62   Sodium 135 - 145 mmol/L 138   138   Potassium 3.5 - 5.1 mmol/L 4.3   3.3   Chloride 98 - 111 mmol/L 104   101   CO2 22 - 32 mmol/L 27   26   Calcium 8.9 - 10.3 mg/dL 9.2   9.2   Total Protein 6.5 - 8.1 g/dL 7.1   7.1   Total Bilirubin 0.3 - 1.2 mg/dL 0.5   0.7   Alkaline Phos 38 - 126 U/L 63   76   AST 15 - 41 U/L 20   24   ALT 0 - 44 U/L 17   20        RADIOGRAPHIC STUDIES: I have personally reviewed the radiological images as listed and agreed with the findings in the report.  CT CHEST ABDOMEN PELVIS WO CONTRAST  Result Date: 09/28/2022 CLINICAL DATA:  History of renal cell carcinoma. * Tracking Code: BO * EXAM: CT CHEST, ABDOMEN AND PELVIS WITHOUT CONTRAST TECHNIQUE: Multidetector CT imaging of the chest, abdomen and pelvis was performed following the standard protocol without IV contrast. RADIATION DOSE REDUCTION: This exam was performed according to the departmental dose-optimization program which includes automated exposure control, adjustment of the mA and/or kV according to patient size and/or use of iterative reconstruction technique. COMPARISON:  03/13/2022 chest CT.  02/27/2022 abdominopelvic CT. FINDINGS: CT CHEST FINDINGS Cardiovascular: Aortic atherosclerosis. Tortuous  descending thoracic aorta. Normal heart size, without pericardial effusion. Mediastinum/Nodes: No supraclavicular adenopathy. No mediastinal or hilar adenopathy, given limitations of unenhanced CT. Lungs/Pleura: No pleural fluid. The left upper lobe 3 mm subpleural pulmonary nodule is similar on 38/4. The right middle lobe vague 4 mm nodule is unchanged on 83/4 and present back to 2013. There is a subtle vague, likely ground-glass nodule in the right lower lobe at 3 mm on 109/4. This is likely present on the prior (109/3 of that exam). Musculoskeletal: No acute osseous abnormality. CT ABDOMEN PELVIS FINDINGS Hepatobiliary: Normal liver. Normal gallbladder, without biliary ductal dilatation. Pancreas: Normal, without mass or ductal dilatation. Spleen: Normal in size, without focal abnormality. Adrenals/Urinary Tract: Normal adrenal glands. No right renal calculi. A lower pole right renal 7 mm low-density lesion on 73/2 which is technically too small to characterize but most likely a cyst. Present on the prior exam. Left nephrectomy, without local recurrence. Normal urinary bladder. Stomach/Bowel: Redundant, underdistended proximal stomach. Extensive colonic diverticulosis. Normal terminal ileum and appendix. Normal small bowel. Vascular/Lymphatic: Normal aortic caliber. No abdominopelvic adenopathy. Reproductive: Normal uterus. Macrolobulated left ovarian/adnexal cystic lesion measures maximally 4.7 cm today 99/2 and is similar to 02/27/2022. Other: Mild pelvic floor laxity.  No significant free fluid. Musculoskeletal: Lumbosacral spondylosis. IMPRESSION: 1. Status post left nephrectomy, without recurrent or metastatic disease. 2. Tiny bilateral pulmonary nodules are  felt to be similar, favoring a benign etiology. 3. Subcentimeter lower pole right renal lesion is likely a cyst and not significantly changed since the prior. 4. Left adnexal cystic lesion or lesions are unchanged and were characterized on 06/06/2021  pelvic ultrasound. Please see that report. Electronically Signed   By: Jeronimo Greaves M.D.   On: 09/28/2022 09:37    ASSESSMENT & PLAN:   Malignant neoplasm of left kidney (HCC) #Left kidney cancer status post nephrectomy. CT scan results were reviewed in details with patient. No signs of disease recurrence.  Stable and unchanged small lung nodules, kidney cyst, left adnexal cyst  Monoallelic mutation of CHEK2 gene in female patient Recommend annual mammogram with annual breast MRI Patient has started colon cancer screening.  Recommend colonoscopy every 5 years Follow-up with endocrinology  Lung nodule Small lung nodules.  Observation.  Follow Up Instructions: Keep same scheduled appointment in August   I discussed the assessment and treatment plan with the patient. The patient was provided an opportunity to ask questions and all were answered. The patient agreed with the plan and demonstrated an understanding of the instructions.  The patient was advised to call back or seek an in-person evaluation if the symptoms worsen or if the condition fails to improve as anticipated.   Rickard Patience, MD 10/05/2022 8:38 PM

## 2022-10-10 ENCOUNTER — Other Ambulatory Visit (HOSPITAL_BASED_OUTPATIENT_CLINIC_OR_DEPARTMENT_OTHER): Payer: PPO

## 2022-10-10 ENCOUNTER — Other Ambulatory Visit (HOSPITAL_BASED_OUTPATIENT_CLINIC_OR_DEPARTMENT_OTHER): Payer: Self-pay | Admitting: Obstetrics & Gynecology

## 2022-10-10 ENCOUNTER — Ambulatory Visit (HOSPITAL_BASED_OUTPATIENT_CLINIC_OR_DEPARTMENT_OTHER): Payer: PPO | Admitting: Obstetrics & Gynecology

## 2022-10-10 DIAGNOSIS — N840 Polyp of corpus uteri: Secondary | ICD-10-CM

## 2022-11-04 DIAGNOSIS — R102 Pelvic and perineal pain: Secondary | ICD-10-CM | POA: Diagnosis not present

## 2022-11-04 DIAGNOSIS — Z6830 Body mass index (BMI) 30.0-30.9, adult: Secondary | ICD-10-CM | POA: Diagnosis not present

## 2022-11-04 DIAGNOSIS — R300 Dysuria: Secondary | ICD-10-CM | POA: Diagnosis not present

## 2022-11-07 DIAGNOSIS — C642 Malignant neoplasm of left kidney, except renal pelvis: Secondary | ICD-10-CM | POA: Diagnosis not present

## 2022-11-07 DIAGNOSIS — N1831 Chronic kidney disease, stage 3a: Secondary | ICD-10-CM | POA: Diagnosis not present

## 2022-11-09 DIAGNOSIS — R5383 Other fatigue: Secondary | ICD-10-CM | POA: Diagnosis not present

## 2022-11-09 DIAGNOSIS — E875 Hyperkalemia: Secondary | ICD-10-CM | POA: Diagnosis not present

## 2022-11-09 DIAGNOSIS — R0781 Pleurodynia: Secondary | ICD-10-CM | POA: Diagnosis not present

## 2022-11-13 ENCOUNTER — Encounter (HOSPITAL_BASED_OUTPATIENT_CLINIC_OR_DEPARTMENT_OTHER): Payer: Self-pay | Admitting: Obstetrics & Gynecology

## 2022-11-14 ENCOUNTER — Other Ambulatory Visit (HOSPITAL_BASED_OUTPATIENT_CLINIC_OR_DEPARTMENT_OTHER): Payer: Self-pay | Admitting: Obstetrics & Gynecology

## 2022-11-14 DIAGNOSIS — Z79899 Other long term (current) drug therapy: Secondary | ICD-10-CM | POA: Diagnosis not present

## 2022-11-14 DIAGNOSIS — R140 Abdominal distension (gaseous): Secondary | ICD-10-CM | POA: Diagnosis not present

## 2022-11-14 DIAGNOSIS — R102 Pelvic and perineal pain: Secondary | ICD-10-CM

## 2022-11-14 DIAGNOSIS — R143 Flatulence: Secondary | ICD-10-CM | POA: Diagnosis not present

## 2022-11-14 DIAGNOSIS — R5382 Chronic fatigue, unspecified: Secondary | ICD-10-CM | POA: Diagnosis not present

## 2022-11-19 ENCOUNTER — Ambulatory Visit
Admission: RE | Admit: 2022-11-19 | Discharge: 2022-11-19 | Disposition: A | Discharge status: Home / Self Care | Payer: PPO | Source: Ambulatory Visit | Attending: Obstetrics & Gynecology | Admitting: Obstetrics & Gynecology

## 2022-11-19 DIAGNOSIS — R102 Pelvic and perineal pain: Secondary | ICD-10-CM | POA: Insufficient documentation

## 2022-11-19 DIAGNOSIS — N83292 Other ovarian cyst, left side: Secondary | ICD-10-CM | POA: Diagnosis not present

## 2022-11-19 DIAGNOSIS — N854 Malposition of uterus: Secondary | ICD-10-CM | POA: Diagnosis not present

## 2022-11-20 DIAGNOSIS — F330 Major depressive disorder, recurrent, mild: Secondary | ICD-10-CM | POA: Diagnosis not present

## 2022-11-20 DIAGNOSIS — N1831 Chronic kidney disease, stage 3a: Secondary | ICD-10-CM | POA: Diagnosis not present

## 2022-11-20 DIAGNOSIS — Z79899 Other long term (current) drug therapy: Secondary | ICD-10-CM | POA: Diagnosis not present

## 2022-11-20 DIAGNOSIS — R7989 Other specified abnormal findings of blood chemistry: Secondary | ICD-10-CM | POA: Diagnosis not present

## 2022-11-20 DIAGNOSIS — C642 Malignant neoplasm of left kidney, except renal pelvis: Secondary | ICD-10-CM | POA: Diagnosis not present

## 2022-11-21 ENCOUNTER — Ambulatory Visit: Admission: RE | Admit: 2022-11-21 | Payer: PPO | Source: Ambulatory Visit

## 2022-11-21 ENCOUNTER — Encounter: Payer: Self-pay | Admitting: Oncology

## 2022-11-21 NOTE — Telephone Encounter (Signed)
Please advise. Pt scheduled for MRI breast w wo.

## 2022-11-26 ENCOUNTER — Telehealth (INDEPENDENT_AMBULATORY_CARE_PROVIDER_SITE_OTHER): Payer: PPO | Admitting: Obstetrics & Gynecology

## 2022-11-26 ENCOUNTER — Ambulatory Visit
Admission: RE | Admit: 2022-11-26 | Discharge: 2022-11-26 | Disposition: A | Discharge status: Home / Self Care | Payer: PPO | Source: Ambulatory Visit | Attending: Oncology | Admitting: Oncology

## 2022-11-26 DIAGNOSIS — Z1589 Genetic susceptibility to other disease: Secondary | ICD-10-CM | POA: Diagnosis not present

## 2022-11-26 DIAGNOSIS — N3941 Urge incontinence: Secondary | ICD-10-CM | POA: Diagnosis not present

## 2022-11-26 DIAGNOSIS — R102 Pelvic and perineal pain: Secondary | ICD-10-CM

## 2022-11-26 DIAGNOSIS — Z85528 Personal history of other malignant neoplasm of kidney: Secondary | ICD-10-CM | POA: Diagnosis not present

## 2022-11-26 DIAGNOSIS — D251 Intramural leiomyoma of uterus: Secondary | ICD-10-CM

## 2022-11-26 DIAGNOSIS — Z1509 Genetic susceptibility to other malignant neoplasm: Secondary | ICD-10-CM | POA: Insufficient documentation

## 2022-11-26 DIAGNOSIS — Z1501 Genetic susceptibility to malignant neoplasm of breast: Secondary | ICD-10-CM | POA: Insufficient documentation

## 2022-11-26 DIAGNOSIS — N83202 Unspecified ovarian cyst, left side: Secondary | ICD-10-CM

## 2022-11-26 DIAGNOSIS — R5383 Other fatigue: Secondary | ICD-10-CM

## 2022-11-26 DIAGNOSIS — Z1502 Genetic susceptibility to malignant neoplasm of ovary: Secondary | ICD-10-CM | POA: Diagnosis not present

## 2022-11-26 DIAGNOSIS — Z1239 Encounter for other screening for malignant neoplasm of breast: Secondary | ICD-10-CM | POA: Diagnosis not present

## 2022-11-26 IMAGING — US US PELVIS COMPLETE
2 series · 13 of 25 positions shown · non-contrast
Comparison: Previous ultrasound from 11/08/2016.

CLINICAL DATA: Follow-up examination for ovarian cyst.

EXAM:
TRANSABDOMINAL ULTRASOUND OF PELVIS
TECHNIQUE: Transabdominal ultrasound examination of the pelvis was performed
including evaluation of the uterus, ovaries, adnexal regions, and
pelvic cul-de-sac.

[Series 1: us pelvis complete · 0.18mm/px · 39 acquisitions, 12 frames shown (1 of 2)]
[im 1/39]
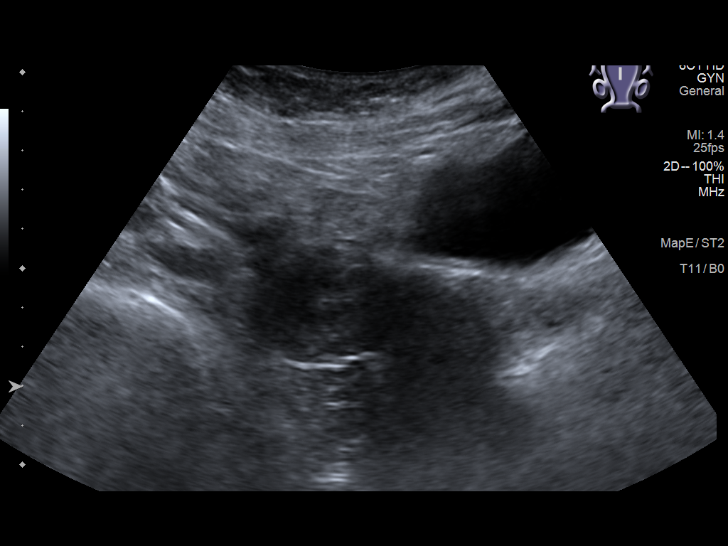
[im 4/39]
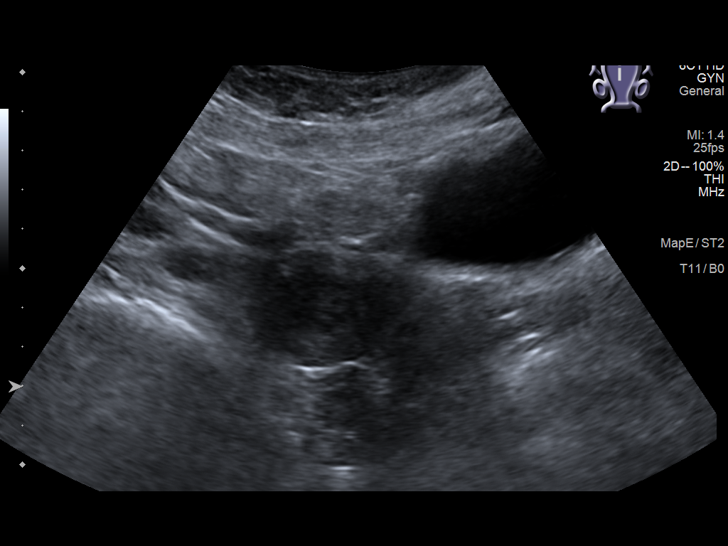
[im 7/39]
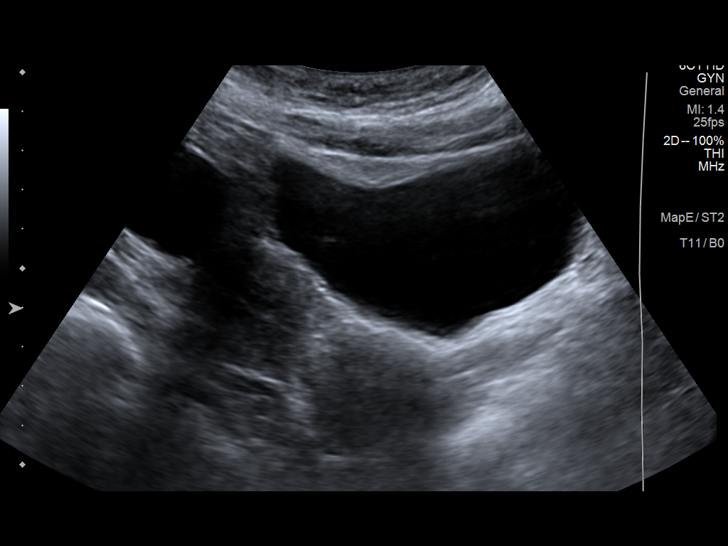
[im 10/39]
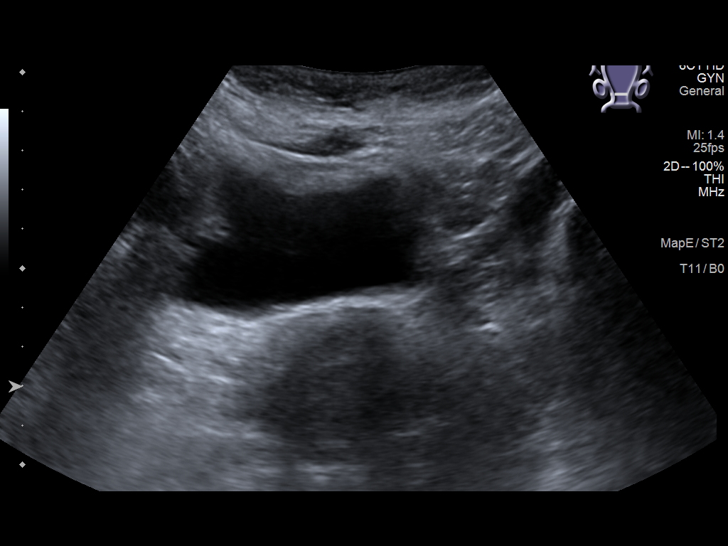
[im 14/39]
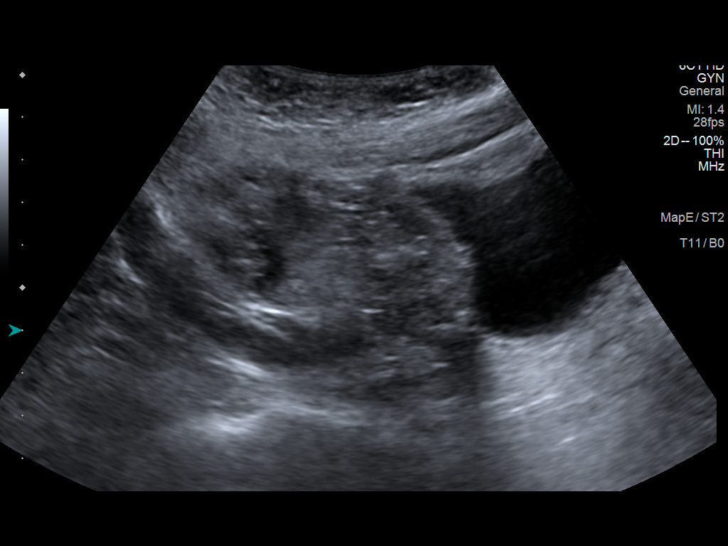
[im 17/39]
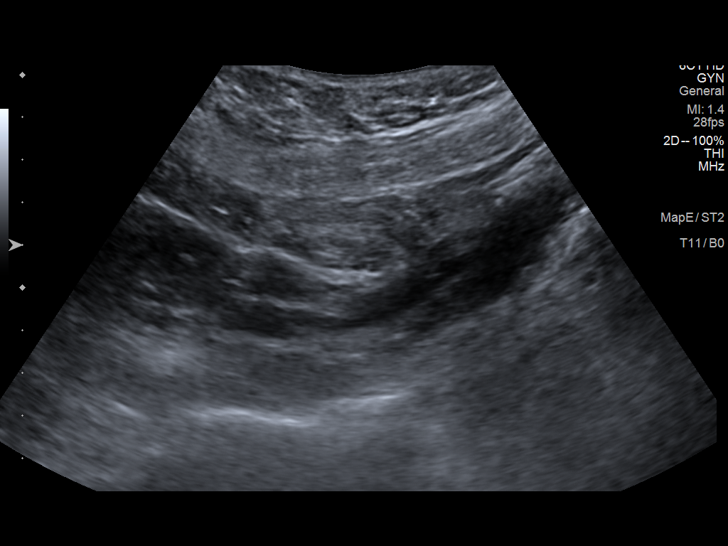
[im 20/39]
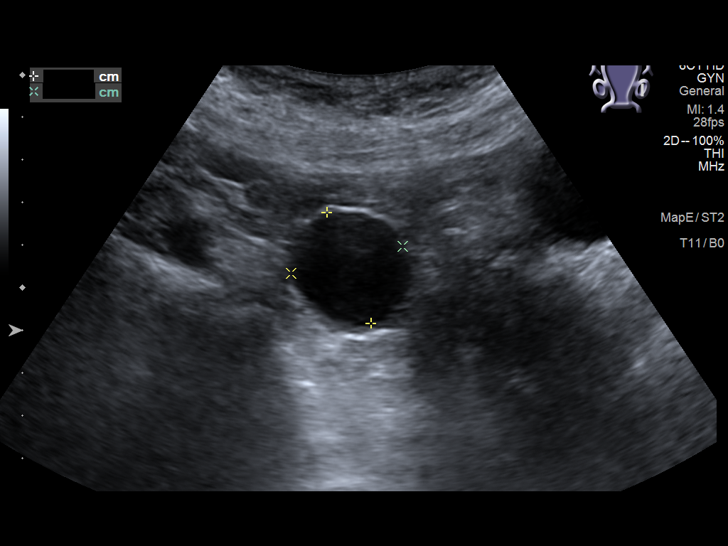
[im 24/39]
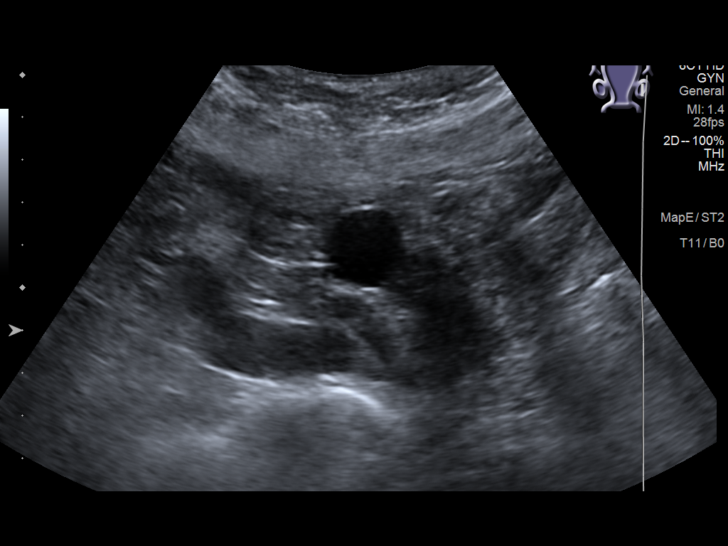
[im 27/39]
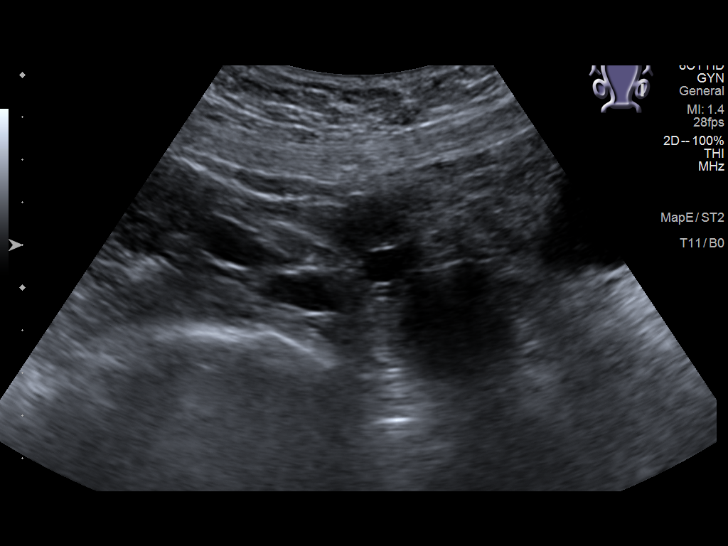
[im 30/39]
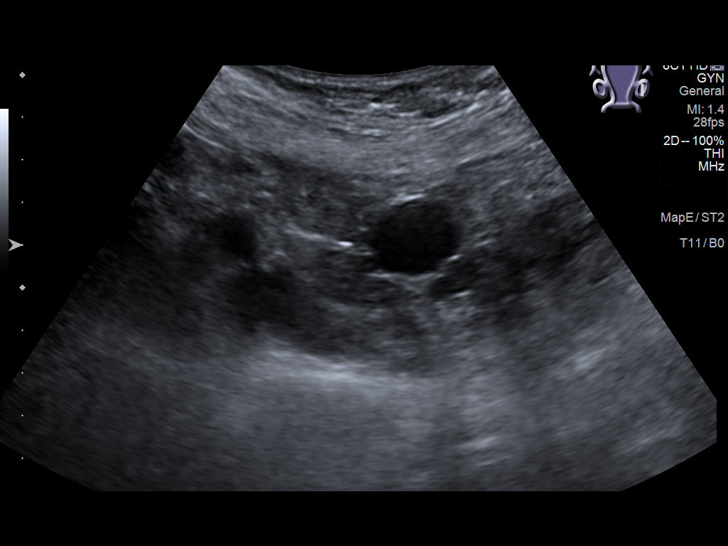
[im 34/39]
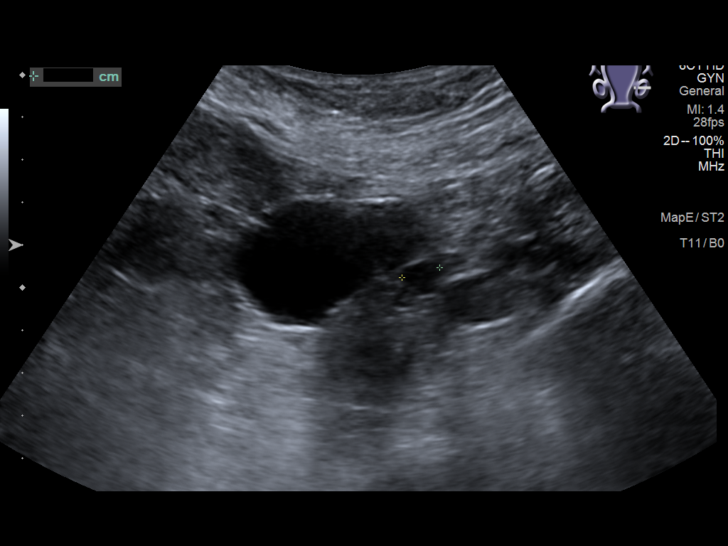
[im 37/39]
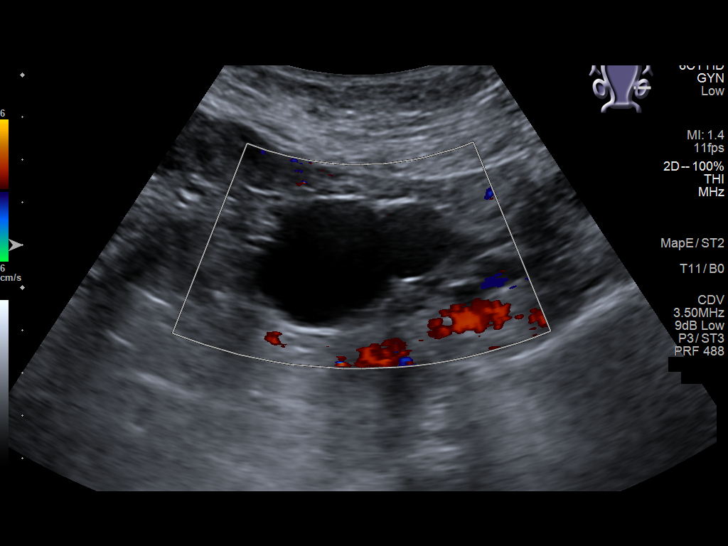

[Series 1001: us pelvis complete · 0.18mm/px · 1 of 2 slices shown (2 of 2)]
[im 1/2]
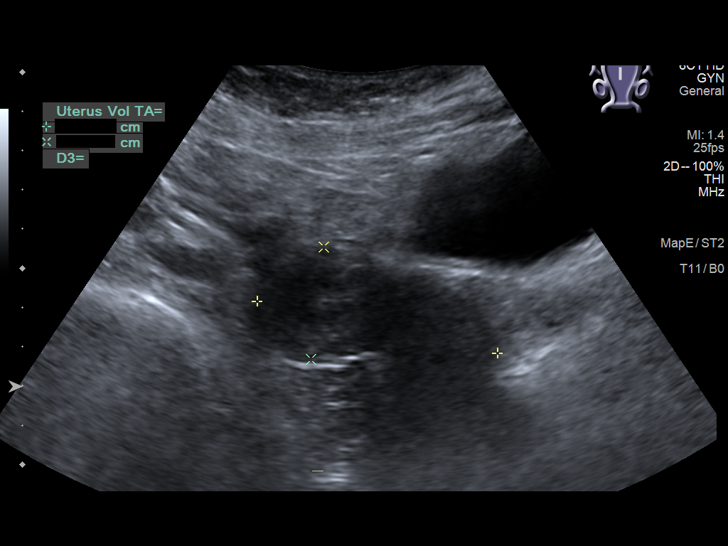

[13 of 25 positions shown; findings below may reference images not displayed]

FINDINGS: Uterus

Measurements: 6.3 x 2.9 x 3.5 cm = volume: 33.4 mL. Uterus is
anteverted. No discrete fibroid or other mass.

Endometrium

Thickness: 6.2 mm.  No focal abnormality visualized.

Right ovary

Not visualized, reportedly surgically absent.  No adnexal mass.

Left ovary

Measurements: 3.2 x 2.6 x 4.6 cm = volume: 19.8 mL. 2.8 x 2.7 x
cm simple cyst is seen. An adjacent 1.8 x 1.7 x 1.6 cm simple cyst
noted as well. A smaller 1.4 x 0.8 x 0.9 cm simple cyst present as
well. No significant internal complexity. No vascularity or solid
nodularity. Allowing for differences in technique, overall
appearance is not significantly changed from previous.

Other findings:  No abnormal free fluid.
IMPRESSION: 1. Three separate simple left ovarian cysts measuring up to 2.8 cm
as above, not significantly changed as compared to most recent
ultrasound from 11/08/2016. No followup imaging recommended. Note:
This recommendation does not apply to premenarchal patients or to
those with increased risk (genetic, family history, elevated tumor
markers or other high-risk factors) of ovarian cancer. Reference:
Radiology [DATE]):[id number].
2. Prior right oophorectomy.  No other adnexal mass or free fluid.
3. Normal sonographic appearance of the uterus and endometrium.

## 2022-11-26 MED ORDER — GADOBUTROL 1 MMOL/ML IV SOLN
7.5000 mL | Freq: Once | INTRAVENOUS | Status: AC | PRN
  Administered 2022-11-26: 7.5 mL via INTRAVENOUS

## 2022-11-27 ENCOUNTER — Encounter (HOSPITAL_BASED_OUTPATIENT_CLINIC_OR_DEPARTMENT_OTHER): Payer: Self-pay | Admitting: Obstetrics & Gynecology

## 2022-11-27 DIAGNOSIS — R5383 Other fatigue: Secondary | ICD-10-CM | POA: Diagnosis not present

## 2022-11-27 NOTE — Progress Notes (Signed)
Virtual Visit via Video Note  I connected with ELVERDA WENDEL on 11/27/22 at 11:15 AM EDT by a video enabled telemedicine application and verified that I am speaking with the correct person using two identifiers.  Location: Patient: home/bedroom Provider: office   I discussed the limitations of evaluation and management by telemedicine and the availability of in person appointments. The patient expressed understanding and agreed to proceed.  History of Present Illness: 67 yo G3P2A1 MWF with more recent onset of back pain and pelvic pain for discussion of recent ultrasound.   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  Her brother had the same thing and died of renal cell Ca.  She underwent genetic testing and then imaging and early renal cell ca was noted.  She underwent nephrectomy 01/2021.  She has known ovarian cysts which have been stable for many years.  She is concerned, of course, about any new change/finding due to family hx.    Ultrasound done 7/17 showed normal uterus with 5mm probable fibroid (that has been present and was 7mm with last ultrasound 06/06/2021.  Endometrium is now 4mm.  Was 7mm with prior imaging.  She did have a hysteroscopy done with provider at [facility] after this.  Right ovary has been removed.  Left with simple cysts measuring 3.7cm and 2.4cm and are unchanged as well.  Pt just wants to be sure about management specifically if anything surgical if needed.  She has reached out to her surgeon at [facility] at my suggestion due to her back pain.  I just think her surgeon needs to know this is new.  CT has been recommended.  Pt wonders if bladder could be part of the issues.  Having more issues with urge incontinence and pelvic pressure.  Sometimes cannot make it to the bathroom and leaks due to urgency.  Ready to see provider.  Lastly, concerned about fatigue.  Blood work from 7/10 reviewed with pt.  Suggested having Vit D level drawn.  She lives in [place] and  will have done at a [facility] there.  Helped pt make appt while on virtual visit.  Pt is in process of moving from home so does have a lot going on right now.  Not doing a lot of packing.  Movers will do this.   Observations/Objective: WNWD WF, NAD  Assessment and Plan: 1. Pelvic pain - encouraged pt to come into office for physical exam if no improvement after seeing urology  2. Urge incontinence of urine - Ambulatory referral to Urology  3. Other fatigue - VITAMIN D 25 Hydroxy (Vit-D Deficiency, Fractures); Future  4. Left ovarian cyst - stable findings on ultrasound 7/17  5. Intramural leiomyoma of uterus - small 5mm fibroid.  No treatment recommended  6. History of renal cell cancer - pt has reached out to provider at [facility] as well  7. Positive test for genetic marker of susceptibility to malignant neoplasm of breast - CHEK2+ testing.  Increased risks for breast cancer.  Having yearly MMG and MRI.  Had MRI done this morning.  Awaiting results.   Follow Up Instructions: I discussed the assessment and treatment plan with the patient. The patient was provided an opportunity to ask questions and all were answered. The patient agreed with the plan and demonstrated an understanding of the instructions.   The patient was advised to call back or seek an in-person evaluation if the symptoms worsen or if the condition fails to improve as anticipated.  I provided 32 minutes of non-face-to-face time during this encounter.   Jerene Bears, MD

## 2022-11-28 LAB — VITAMIN D 25 HYDROXY (VIT D DEFICIENCY, FRACTURES): Vit D, 25-Hydroxy: 38.9 ng/mL (ref 30.0–100.0)

## 2022-12-06 ENCOUNTER — Inpatient Hospital Stay: Payer: PPO | Admitting: Oncology

## 2022-12-06 ENCOUNTER — Inpatient Hospital Stay: Payer: PPO | Attending: Oncology

## 2023-01-15 ENCOUNTER — Encounter: Payer: Self-pay | Admitting: Oncology

## 2023-01-15 NOTE — Telephone Encounter (Signed)
Pt cancelled appts on 8/1. Did you want to r/s those and discuss CT then?

## 2023-01-28 ENCOUNTER — Ambulatory Visit: Payer: Self-pay | Admitting: Urology

## 2023-02-07 ENCOUNTER — Inpatient Hospital Stay: Payer: PPO | Admitting: Oncology

## 2023-02-07 ENCOUNTER — Encounter: Payer: Self-pay | Admitting: Oncology

## 2023-02-07 ENCOUNTER — Inpatient Hospital Stay: Payer: PPO | Attending: Oncology

## 2023-02-07 VITALS — BP 126/80 | HR 70 | Temp 96.8°F | Resp 18

## 2023-02-07 DIAGNOSIS — R911 Solitary pulmonary nodule: Secondary | ICD-10-CM

## 2023-02-07 DIAGNOSIS — E871 Hypo-osmolality and hyponatremia: Secondary | ICD-10-CM | POA: Diagnosis not present

## 2023-02-07 DIAGNOSIS — Z1502 Genetic susceptibility to malignant neoplasm of ovary: Secondary | ICD-10-CM | POA: Diagnosis not present

## 2023-02-07 DIAGNOSIS — Z1509 Genetic susceptibility to other malignant neoplasm: Secondary | ICD-10-CM | POA: Insufficient documentation

## 2023-02-07 DIAGNOSIS — Z1589 Genetic susceptibility to other disease: Secondary | ICD-10-CM

## 2023-02-07 DIAGNOSIS — Z905 Acquired absence of kidney: Secondary | ICD-10-CM | POA: Insufficient documentation

## 2023-02-07 DIAGNOSIS — Z1501 Genetic susceptibility to malignant neoplasm of breast: Secondary | ICD-10-CM | POA: Diagnosis not present

## 2023-02-07 DIAGNOSIS — I129 Hypertensive chronic kidney disease with stage 1 through stage 4 chronic kidney disease, or unspecified chronic kidney disease: Secondary | ICD-10-CM | POA: Insufficient documentation

## 2023-02-07 DIAGNOSIS — C642 Malignant neoplasm of left kidney, except renal pelvis: Secondary | ICD-10-CM

## 2023-02-07 DIAGNOSIS — N1831 Chronic kidney disease, stage 3a: Secondary | ICD-10-CM

## 2023-02-07 DIAGNOSIS — Z1231 Encounter for screening mammogram for malignant neoplasm of breast: Secondary | ICD-10-CM

## 2023-02-07 DIAGNOSIS — N183 Chronic kidney disease, stage 3 unspecified: Secondary | ICD-10-CM | POA: Insufficient documentation

## 2023-02-07 LAB — CMP (CANCER CENTER ONLY)
ALT: 15 U/L (ref 0–44)
AST: 17 U/L (ref 15–41)
Albumin: 3.9 g/dL (ref 3.5–5.0)
Alkaline Phosphatase: 60 U/L (ref 38–126)
Anion gap: 5 (ref 5–15)
BUN: 21 mg/dL (ref 8–23)
CO2: 27 mmol/L (ref 22–32)
Calcium: 9.2 mg/dL (ref 8.9–10.3)
Chloride: 97 mmol/L — ABNORMAL LOW (ref 98–111)
Creatinine: 1.37 mg/dL — ABNORMAL HIGH (ref 0.44–1.00)
GFR, Estimated: 42 mL/min — ABNORMAL LOW (ref >60)
Glucose, Bld: 95 mg/dL (ref 70–99)
Potassium: 4.2 mmol/L (ref 3.5–5.1)
Sodium: 129 mmol/L — ABNORMAL LOW (ref 135–145)
Total Bilirubin: 0.8 mg/dL (ref 0.3–1.2)
Total Protein: 6.9 g/dL (ref 6.5–8.1)

## 2023-02-07 LAB — CBC WITH DIFFERENTIAL (CANCER CENTER ONLY)
Abs Immature Granulocytes: 0.01 10*3/uL (ref 0.00–0.07)
Basophils Absolute: 0 10*3/uL (ref 0.0–0.1)
Basophils Relative: 0 %
Eosinophils Absolute: 0.1 10*3/uL (ref 0.0–0.5)
Eosinophils Relative: 2 %
HCT: 38.7 % (ref 36.0–46.0)
Hemoglobin: 12.8 g/dL (ref 12.0–15.0)
Immature Granulocytes: 0 %
Lymphocytes Relative: 29 %
Lymphs Abs: 1.3 10*3/uL (ref 0.7–4.0)
MCH: 32.5 pg (ref 26.0–34.0)
MCHC: 33.1 g/dL (ref 30.0–36.0)
MCV: 98.2 fL (ref 80.0–100.0)
Monocytes Absolute: 0.5 10*3/uL (ref 0.1–1.0)
Monocytes Relative: 10 %
Neutro Abs: 2.7 10*3/uL (ref 1.7–7.7)
Neutrophils Relative %: 59 %
Platelet Count: 167 10*3/uL (ref 150–400)
RBC: 3.94 MIL/uL (ref 3.87–5.11)
RDW: 12.5 % (ref 11.5–15.5)
WBC Count: 4.6 10*3/uL (ref 4.0–10.5)
nRBC: 0 % (ref 0.0–0.2)

## 2023-02-07 LAB — LACTATE DEHYDROGENASE: LDH: 99 U/L (ref 98–192)

## 2023-02-07 NOTE — Assessment & Plan Note (Signed)
Likely due to Triamterene Recommend patient to discuss with PCP

## 2023-02-07 NOTE — Assessment & Plan Note (Signed)
Due to nephrectomy. Encourage oral hydration.  Avoid nephrotoxins.

## 2023-02-07 NOTE — Assessment & Plan Note (Addendum)
Recommend annual mammogram with annual breast MRI MRI breast results were reviewed, obtain mammogram in Jan 2025 Patient has started colon cancer screening.  Recommend colonoscopy every 5 years Follow-up with endocrinology

## 2023-02-07 NOTE — Assessment & Plan Note (Addendum)
#  Left kidney cancer status post nephrectomy. March 2024 CT scan showed no signs of disease recurrence.  Stable and unchanged small lung nodules, kidney cyst, left adnexal cyst She has new left flank pain, will obtain CT chest abdomen pelvis wo contrast

## 2023-02-07 NOTE — Progress Notes (Signed)
Hematology/Oncology Progress note [phone] [phone]      Patient Care Team: Danella Penton, MD as PCP - General (Internal Medicine) Rickard Patience, MD as Consulting Physician (Oncology)  ASSESSMENT & PLAN:   Cancer Staging  Malignant neoplasm of left kidney [facility]) Staging form: Kidney, AJCC 8th Edition - Pathologic stage from 05/24/2021: Stage I (pT1b, pN0, cM0) - Signed by Rickard Patience, MD on 05/24/2021   Malignant neoplasm of left kidney (HCC) #Left kidney cancer status post nephrectomy. March 2024 CT scan showed no signs of disease recurrence.  Stable and unchanged small lung nodules, kidney cyst, left adnexal cyst She has new left flank pain, will obtain CT chest abdomen pelvis wo contrast  Monoallelic mutation of CHEK2 gene in female patient Recommend annual mammogram with annual breast MRI MRI breast results were reviewed, obtain mammogram in Jan 2025 Patient has started colon cancer screening.  Recommend colonoscopy every 5 years Follow-up with endocrinology  Lung nodule Small lung nodules.  Observation. On CT surveillance.   CKD (chronic kidney disease) stage 3, GFR 30-59 ml/min (HCC) Due to nephrectomy. Encourage oral hydration.  Avoid nephrotoxins.  Hyponatremia Likely due to Triamterene Recommend patient to discuss with PCP     Orders Placed This Encounter  Procedures   CT CHEST ABDOMEN PELVIS WO CONTRAST    Standing Status:   Future    Standing Expiration Date:   02/07/2024    Order Specific Question:   Preferred imaging location?    Answer:   [facility]    Order Specific Question:   If indicated for the ordered procedure, I authorize the administration of oral contrast media per Radiology protocol    Answer:   Yes    Order Specific Question:   Does the patient have a contrast media/X-ray dye allergy?    Answer:   No   MM 3D SCREENING MAMMOGRAM BILATERAL BREAST    Standing Status:   Future    Standing Expiration Date:   02/07/2024     Order Specific Question:   Reason for Exam (SYMPTOM  OR DIAGNOSIS REQUIRED)    Answer:   Screening    Order Specific Question:   Preferred imaging location?    Answer:   [facility]   CBC with Differential (Cancer Center Only)    Standing Status:   Future    Standing Expiration Date:   02/07/2024   CMP (Cancer Center only)    Standing Status:   Future    Standing Expiration Date:   02/07/2024   Follow up in 6 months.  All questions were answered. The patient knows to call the clinic with any problems, questions or concerns.  Rickard Patience, MD, PhD [facility] Hematology Oncology 02/07/2023   CHIEF COMPLAINTS/REASON FOR VISIT:  High risk for development of breast cancer, history of kidney cancer  HISTORY OF PRESENTING ILLNESS:   Carla Fuentes is a  67 y.o.  female presents for heterozygous CHEK2 mutation, history of kidney cancer,  Patient reports that her brother has been sick for the past few years due to unintentional weight loss and was eventually diagnosed with gastric cancer and passed away.  Brother told patient " to check breast cancer gene". She mentioned to gynecologist and was referred to genetic counselor for testing.    08/10/2020, patient had a genetic testing was tested positive for heterozygous CHEK2 Mother was adopted. Family history is positive for brother with stomach cancer, paternal aunt ovarian cancer, another paternal aunt cervical cancer, paternal uncle  cancer with unknown details, paternal cousin pancreatic cancer, another cousin with blood cancer, second paternal cousin lung cancer, niece thyroid cancer  Oncology History Overview Note  RCC was managed by her [facility]ist.    Malignant neoplasm of left kidney (HCC)  11/21/2020 Imaging    CT abdomen pelvis wo contrast Intermediate attenuation mass at upper pole of LEFT kidney 4.7 x 4.6 x 4.6 cm    11/28/2020 Imaging   MRI abdomen w wo contrast: heterogeneously contrast enhancing, partially exophytic  mass of the lateral superior pole of the left kidney measuring 4.7 x 4.6 cm, consistent with renal cell carcinoma. Although not definitive, appearance of the left renal vein is suspicious for renal vein invasion   12/05/2020 Initial Diagnosis   Malignant neoplasm of left kidney (HCC)  01/18/2021 left nephrectomy and adrenalectomy, left perihilar lymphadenectomy.  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  Benign adrenal gland.  1 lymph node was examined and was negative for carcinoma. pT1b pN0    05/24/2021 Cancer Staging   Staging form: Kidney, AJCC 8th Edition - Pathologic stage from 05/24/2021: Stage I (pT1b, pN0, cM0) - Signed by Rickard Patience, MD on 05/24/2021 Stage prefix: Initial diagnosis   08/18/2021 Imaging    CT RCC protocol abdomen pelvis with and without contrast at [facility] showed status post left nephrectomy without evidence of recurrence or metastatic disease.  Nonspecific left inguinal lymphadenopathy.  Attention on follow-up.  Left ovarian cyst can be assessed for stability on follow-up imaging.     08/21/2021 Procedure    patient had hysteroscopy, D&C, benign endometrial biopsy.   02/28/2022 Imaging   CT abdomen pelvis without contrast showed 1. Status post left nephrectomy without evidence of local recurrence. 2. No evidence of metastatic disease in the abdomen or pelvis. 3. Right middle lobe 4 mm pulmonary nodule is nonspecific, and was not seen on recent prior imaging studies although this appears to be outside of the few field of view on those examinations. Suggest follow-up dedicated chest CT for further evaluation. 4. Cystic left ovarian lesions previously evaluated by pelvic ultrasound June 06, 2021 and characterized as 3 adjacent renal cysts measuring up to 3.6 cm appear unchanged from CT November 21, 2020. 5. Sigmoid colonic diverticulosis without findings of acute diverticulitis   03/13/2022 Imaging   CT chest with contrast 1. 4 mm right middle lobe pulmonary nodule is  stable in the interval since prior abdomen/pelvis CT and also comparing back to remote chest CT of 05/18/2011. No followup imaging is recommended. 2. 3 mm subpleural left upper lobe pulmonary nodule is new, likely benign. Although likely benign, a non-contrast chest CT can be considered in 12 months.      09/04/2021, seen by cardiology for family history of hypertrophic cardiomyopathy. 09/11/2021, bilateral breast MRI with and without contrast showed no evidence of breast malignancy.  INTERVAL HISTORY Carla Fuentes is a 67 y.o. female who has above history reviewed by me today presents for follow up visit for management of  At the risk of developing breast cancer, history of left renal cell carcinoma. She has experienced left flank pain. No fever chills.  Lower extremity throbbing, chronic.    Review of Systems  Constitutional:  Negative for appetite change, chills, fatigue and fever.  HENT:   Negative for hearing loss and voice change.   Eyes:  Negative for eye problems.  Respiratory:  Negative for chest tightness and cough.   Cardiovascular:  Negative for chest pain.  Gastrointestinal:  Negative for abdominal  distention, abdominal pain and blood in stool.       Left flank pressure  Endocrine: Negative for hot flashes.  Genitourinary:  Negative for difficulty urinating and frequency.   Musculoskeletal:  Negative for arthralgias.  Skin:  Negative for itching and rash.  Neurological:  Negative for extremity weakness.  Hematological:  Negative for adenopathy.  Psychiatric/Behavioral:  Negative for confusion.     MEDICAL HISTORY:  Past Medical History:  Diagnosis Date   Abnormal Pap smear    Anxiety    Basal cell adenocarcinoma    skin, face   Colitis 01/05/2015   treated wtih antibiotics   Depression    Family history of lung cancer    Family history of ovarian cancer    Family history of pancreatic cancer    Family history of stomach cancer    Family history of thyroid  cancer    GERD (gastroesophageal reflux disease)    History of renal cell cancer 2022   s/p left nephrectomy at [facility]   Hypertension    started on medication for LEE   Positive test for genetic breast cancer susceptibility marker    CHEK2+ gene   TIA (transient ischemic attack)    initially question as TIA, evaluation negative.  Neurologist felt this was anxiety related.    SURGICAL HISTORY: Past Surgical History:  Procedure Laterality Date   CERVICAL BIOPSY  W/ LOOP ELECTRODE EXCISION     CESAREAN SECTION     2, FTP with fetal distress   COLONOSCOPY  01/06/2015   repeat 5 years   COLONOSCOPY WITH PROPOFOL N/A 05/30/2020   Procedure: COLONOSCOPY WITH PROPOFOL;  Surgeon: Sung Amabile, DO;  Location: [facility] ENDOSCOPY;  Service: General;  Laterality: N/A;   ESOPHAGOGASTRODUODENOSCOPY N/A 05/30/2020   Procedure: ESOPHAGOGASTRODUODENOSCOPY (EGD);  Surgeon: Sung Amabile, DO;  Location: [facility] ENDOSCOPY;  Service: General;  Laterality: N/A;   HEMORRHOID BANDING  11/05/2014   Dr. Katrinka Blazing, [place]   HYSTEROSCOPY WITH D & C  08/21/2021   [facility]. Everitt Amber at [facility]   NEPHRECTOMY Left 01/18/2021   Dr. Roselie Awkward, [facility].  Renal cell ca.   OOPHORECTOMY  05/08/1999   for recurrent ovarian cyst    SOCIAL HISTORY: Social History   Socioeconomic History   Marital status: Married    Spouse name: Not on file   Number of children: Not on file   Years of education: Not on file   Highest education level: Not on file  Occupational History    Employer: SELF EMPLOYED    Comment: [employer]  Tobacco Use   Smoking status: Never   Smokeless tobacco: Never  Vaping Use   Vaping status: Never Used  Substance and Sexual Activity   Alcohol use: Yes    Alcohol/week: 0.0 - 1.0 standard drinks of alcohol   Drug use: No   Sexual activity: Yes    Partners: Male    Birth control/protection: Post-menopausal    Comment: husband vasectomy  Other Topics Concern   Not on file  Social History  Narrative   Lives with husband, remarried. 2 children, 33YO and 35YO, one child lives [place].      Work - Physicist, medical      Diet - regular diet      Exercise - works out 3-4 times per week   Social Determinants of Corporate investment banker Strain: Not on [employer] Insecurity: Not on file  Transportation Needs: No Transportation Needs (01/19/2021)   Received  from [facility]   PRAPARE - Transportation  Physical Activity: Not on file  Stress: Not on file  Social Connections: Not on file  Intimate Partner Violence: Not on file    FAMILY HISTORY: Family History  Problem Relation Age of Onset   Diabetes Mother    Hypertension Mother    Heart disease Father    Diabetes Brother    Stomach cancer Brother    Ovarian cancer Paternal Aunt    Cancer Paternal Uncle        unk type   Cervical cancer Paternal Aunt    Pancreatic cancer Cousin        d. 13s   Cancer Cousin        blood cancer   Other Cousin        d. brain tumor 30s/40s   Lung cancer Cousin        x2; hx smoking   Cancer Cousin        metastatic   Thyroid cancer Niece        dx 30s, neg. GT    ALLERGIES:  is allergic to penicillins.  MEDICATIONS:  Current Outpatient Medications  Medication Sig Dispense Refill   ALPRAZolam (XANAX) 0.25 MG tablet Take 1 tablet (0.25 mg total) by mouth 3 (three) times daily as needed for sleep or anxiety (Take 1 tablet by mouth every 8 hours as needed for anxiety). 30 tablet 1   buPROPion (WELLBUTRIN XL) 150 MG 24 hr tablet Take 150 mg by mouth daily.     Cholecalciferol (VITAMIN D-1000 MAX ST) 25 MCG (1000 UT) tablet Take by mouth.     CRANBERRY PO Take by mouth.     FLUoxetine (PROZAC) 40 MG capsule Take 40 mg by mouth daily.     losartan (COZAAR) 50 MG tablet Take 50 mg by mouth daily.     MAGNESIUM PO Take by mouth daily.     Multiple Vitamin (MULTIVITAMIN) capsule Take 1 capsule by mouth daily.     Omega-3 Fatty Acids (FISH OIL PEARLS)  183.33 MG CAPS Take by mouth.     pantoprazole (PROTONIX) 40 MG tablet Take 40 mg by mouth daily.     pyridOXINE (B-6) 50 MG tablet Take by mouth.     temazepam (RESTORIL) 30 MG capsule TAKE 1 CAPSULE AT BEDTIME AS NEEDED INSOMNIA 30 capsule 5   triamterene-hydrochlorothiazide (DYAZIDE) 37.5-25 MG capsule Take 1 capsule by mouth every morning.     No current facility-administered medications for this visit.     PHYSICAL EXAMINATION: ECOG PERFORMANCE STATUS: 0 - Asymptomatic Vitals:   02/07/23 1034  BP: 126/80  Pulse: 70  Resp: 18  Temp: (!) 96.8 F (36 C)  SpO2: 100%   There were no vitals filed for this visit.   Physical Exam Constitutional:      General: She is not in acute distress. HENT:     Head: Normocephalic and atraumatic.  Eyes:     General: No scleral icterus. Cardiovascular:     Rate and Rhythm: Normal rate.  Pulmonary:     Effort: Pulmonary effort is normal. No respiratory distress.  Abdominal:     General: There is no distension.  Musculoskeletal:        General: No deformity. Normal range of motion.     Cervical back: Normal range of motion.  Skin:    Findings: No rash.  Neurological:     Mental Status: She is alert and oriented to person, place, and  time. Mental status is at baseline.     Cranial Nerves: No cranial nerve deficit.  Psychiatric:     Comments: Anxious     LABORATORY DATA:  I have reviewed the data as listed    Latest Ref Rng & Units 02/07/2023    9:52 AM 04/04/2022    1:57 PM 01/05/2015    1:20 PM  CBC  WBC 4.0 - 10.5 K/uL 4.6  4.9  11.6   Hemoglobin 12.0 - 15.0 g/dL 86.5  78.4  69.6   Hematocrit 36.0 - 46.0 % 38.7  39.7  42.0   Platelets 150 - 400 K/uL 167  172  207       Latest Ref Rng & Units 02/07/2023    9:52 AM 04/04/2022    1:57 PM 03/13/2022    8:19 AM  CMP  Glucose 70 - 99 mg/dL 95  94    BUN 8 - 23 mg/dL 21  25    Creatinine 2.95 - 1.00 mg/dL 2.84  1.32  4.40   Sodium 135 - 145 mmol/L 129  138    Potassium 3.5  - 5.1 mmol/L 4.2  4.3    Chloride 98 - 111 mmol/L 97  104    CO2 22 - 32 mmol/L 27  27    Calcium 8.9 - 10.3 mg/dL 9.2  9.2    Total Protein 6.5 - 8.1 g/dL 6.9  7.1    Total Bilirubin 0.3 - 1.2 mg/dL 0.8  0.5    Alkaline Phos 38 - 126 U/L 60  63    AST 15 - 41 U/L 17  20    ALT 0 - 44 U/L 15  17      RADIOGRAPHIC STUDIES: I have personally reviewed the radiological images as listed and agreed with the findings in the report. No results found.

## 2023-02-07 NOTE — Assessment & Plan Note (Addendum)
Small lung nodules.  Observation. On CT surveillance.

## 2023-02-13 ENCOUNTER — Ambulatory Visit
Admission: RE | Admit: 2023-02-13 | Discharge: 2023-02-13 | Disposition: A | Discharge status: Home / Self Care | Payer: PPO | Source: Ambulatory Visit | Attending: Oncology | Admitting: Oncology

## 2023-02-13 DIAGNOSIS — Z905 Acquired absence of kidney: Secondary | ICD-10-CM | POA: Diagnosis not present

## 2023-02-13 DIAGNOSIS — R5383 Other fatigue: Secondary | ICD-10-CM | POA: Diagnosis not present

## 2023-02-13 DIAGNOSIS — R8290 Unspecified abnormal findings in urine: Secondary | ICD-10-CM | POA: Diagnosis not present

## 2023-02-13 DIAGNOSIS — I700 Atherosclerosis of aorta: Secondary | ICD-10-CM | POA: Diagnosis not present

## 2023-02-13 DIAGNOSIS — C642 Malignant neoplasm of left kidney, except renal pelvis: Secondary | ICD-10-CM | POA: Insufficient documentation

## 2023-02-13 DIAGNOSIS — E871 Hypo-osmolality and hyponatremia: Secondary | ICD-10-CM | POA: Diagnosis not present

## 2023-02-13 DIAGNOSIS — R0683 Snoring: Secondary | ICD-10-CM | POA: Diagnosis not present

## 2023-02-13 DIAGNOSIS — K5730 Diverticulosis of large intestine without perforation or abscess without bleeding: Secondary | ICD-10-CM | POA: Diagnosis not present

## 2023-02-13 DIAGNOSIS — N1832 Chronic kidney disease, stage 3b: Secondary | ICD-10-CM | POA: Diagnosis not present

## 2023-02-18 ENCOUNTER — Telehealth: Payer: Self-pay | Admitting: *Deleted

## 2023-02-18 NOTE — Telephone Encounter (Signed)
Patient called reporting that she sees her results and is asking if Dr Cathie Hoops thinks she needs to have an RI or not. Please return her call

## 2023-02-19 ENCOUNTER — Encounter: Payer: Self-pay | Admitting: Oncology

## 2023-02-19 ENCOUNTER — Other Ambulatory Visit: Payer: Self-pay

## 2023-02-19 DIAGNOSIS — C642 Malignant neoplasm of left kidney, except renal pelvis: Secondary | ICD-10-CM

## 2023-02-19 NOTE — Telephone Encounter (Signed)
Pt informed via mychart of plan for CT and follow up in 6 months.   Please schedule and inform pt of appts: CT chest abd pel in 6 months Please adjust appts in April to be 1 week after CT

## 2023-02-22 ENCOUNTER — Other Ambulatory Visit (HOSPITAL_BASED_OUTPATIENT_CLINIC_OR_DEPARTMENT_OTHER): Payer: Self-pay

## 2023-02-22 DIAGNOSIS — R0683 Snoring: Secondary | ICD-10-CM

## 2023-02-22 DIAGNOSIS — R5383 Other fatigue: Secondary | ICD-10-CM

## 2023-02-22 DIAGNOSIS — G4709 Other insomnia: Secondary | ICD-10-CM

## 2023-03-05 DIAGNOSIS — H1045 Other chronic allergic conjunctivitis: Secondary | ICD-10-CM | POA: Diagnosis not present

## 2023-03-05 DIAGNOSIS — H2513 Age-related nuclear cataract, bilateral: Secondary | ICD-10-CM | POA: Diagnosis not present

## 2023-03-05 DIAGNOSIS — H5203 Hypermetropia, bilateral: Secondary | ICD-10-CM | POA: Diagnosis not present

## 2023-03-25 ENCOUNTER — Ambulatory Visit: Payer: Self-pay | Admitting: Urology

## 2023-03-26 DIAGNOSIS — N1832 Chronic kidney disease, stage 3b: Secondary | ICD-10-CM | POA: Diagnosis not present

## 2023-03-26 DIAGNOSIS — E871 Hypo-osmolality and hyponatremia: Secondary | ICD-10-CM | POA: Diagnosis not present

## 2023-03-26 DIAGNOSIS — Z905 Acquired absence of kidney: Secondary | ICD-10-CM | POA: Diagnosis not present

## 2023-04-15 DIAGNOSIS — M9903 Segmental and somatic dysfunction of lumbar region: Secondary | ICD-10-CM | POA: Diagnosis not present

## 2023-04-15 DIAGNOSIS — M542 Cervicalgia: Secondary | ICD-10-CM | POA: Diagnosis not present

## 2023-04-15 DIAGNOSIS — M51371 Other intervertebral disc degeneration, lumbosacral region with lower extremity pain only: Secondary | ICD-10-CM | POA: Diagnosis not present

## 2023-04-15 DIAGNOSIS — M9901 Segmental and somatic dysfunction of cervical region: Secondary | ICD-10-CM | POA: Diagnosis not present

## 2023-04-16 DIAGNOSIS — M542 Cervicalgia: Secondary | ICD-10-CM | POA: Diagnosis not present

## 2023-04-16 DIAGNOSIS — M9903 Segmental and somatic dysfunction of lumbar region: Secondary | ICD-10-CM | POA: Diagnosis not present

## 2023-04-16 DIAGNOSIS — M51371 Other intervertebral disc degeneration, lumbosacral region with lower extremity pain only: Secondary | ICD-10-CM | POA: Diagnosis not present

## 2023-04-16 DIAGNOSIS — M9901 Segmental and somatic dysfunction of cervical region: Secondary | ICD-10-CM | POA: Diagnosis not present

## 2023-04-16 NOTE — Progress Notes (Unsigned)
67 y.o. [id number] Married White or Caucasian female here for breast and pelvic exam.  I am also following her for ***.  Denies vaginal bleeding.  Patient's last menstrual period was 12/06/2011.          Sexually active: {yes no:314532}  H/O STD:  {YES NO:22349}  Health Maintenance: PCP:  ****.  Last wellness appt was ***.  Did blood work at that appt:   Vaccines are up to date:  {YES NO:22349} Colonoscopy:  05/30/2020 MMG:  05/21/2022 BMD:  03/18/2018 Last pap smear:  04/11/2020 Negative.   H/o abnormal pap smear:      reports that she has never smoked. She has never used smokeless tobacco. She reports current alcohol use. She reports that she does not use drugs.  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  Neurologist felt this was anxiety related.    Past Surgical History:  Procedure Laterality Date   CERVICAL BIOPSY  W/ LOOP ELECTRODE EXCISION     CESAREAN SECTION     2, FTP with fetal distress   COLONOSCOPY  01/06/2015   repeat 5 years   COLONOSCOPY WITH PROPOFOL N/A 05/30/2020   Procedure: COLONOSCOPY WITH PROPOFOL;  Surgeon: Sung Amabile, DO;  Location: [facility] ENDOSCOPY;  Service: General;  Laterality: N/A;   ESOPHAGOGASTRODUODENOSCOPY N/A 05/30/2020   Procedure: ESOPHAGOGASTRODUODENOSCOPY (EGD);  Surgeon: Sung Amabile, DO;  Location: [facility]  ENDOSCOPY;  Service: General;  Laterality: N/A;   HEMORRHOID BANDING  11/05/2014   Dr. Katrinka Blazing, [place]   HYSTEROSCOPY WITH D & C  08/21/2021   Dr. Everitt Amber at [facility]   NEPHRECTOMY Left 01/18/2021   Dr. Roselie Awkward, [facility].  Renal cell ca.   OOPHORECTOMY  05/08/1999   for recurrent ovarian cyst    Current Outpatient Medications  Medication Sig Dispense Refill   ALPRAZolam (XANAX) 0.25 MG tablet Take 1 tablet (0.25 mg total) by mouth 3 (three) times daily as needed for sleep or anxiety (Take 1 tablet by mouth every 8 hours as needed for anxiety). 30 tablet 1   buPROPion (WELLBUTRIN XL) 150 MG 24 hr tablet Take 150 mg by mouth daily.     Cholecalciferol (VITAMIN D-1000 MAX ST) 25 MCG (1000 UT) tablet Take by mouth.     CRANBERRY PO Take by mouth.     FLUoxetine (PROZAC) 40 MG capsule Take 40 mg by mouth daily.     losartan (COZAAR) 50 MG tablet Take 50 mg by mouth daily.     MAGNESIUM PO Take by mouth daily.     Multiple Vitamin (MULTIVITAMIN) capsule Take 1 capsule by mouth daily.     Omega-3 Fatty Acids (FISH OIL PEARLS) 183.33 MG CAPS Take by mouth.  pantoprazole (PROTONIX) 40 MG tablet Take 40 mg by mouth daily.     pyridOXINE (B-6) 50 MG tablet Take by mouth.     temazepam (RESTORIL) 30 MG capsule TAKE 1 CAPSULE AT BEDTIME AS NEEDED INSOMNIA 30 capsule 5   triamterene-hydrochlorothiazide (DYAZIDE) 37.5-25 MG capsule Take 1 capsule by mouth every morning.     No current facility-administered medications for this visit.    Family History  Problem Relation Age of Onset   Diabetes Mother    Hypertension Mother    Heart disease Father    Diabetes Brother    Stomach cancer Brother    Ovarian cancer Paternal Aunt    Cancer Paternal Uncle        unk type   Cervical cancer Paternal Aunt    Pancreatic cancer Cousin        d. 49s   Cancer Cousin        blood cancer   Other Cousin        d. brain tumor 30s/40s   Lung cancer Cousin        x2; hx smoking   Cancer Cousin         metastatic   Thyroid cancer Niece        dx 30s, neg. GT    Review of Systems  Exam:   LMP 12/06/2011      General appearance: alert, cooperative and appears stated age Breasts: {Exam; breast:13139::"normal appearance, no masses or tenderness"} Abdomen: soft, non-tender; bowel sounds normal; no masses,  no organomegaly Lymph nodes: Cervical, supraclavicular, and axillary nodes normal.  No abnormal inguinal nodes palpated Neurologic: Grossly normal  Pelvic: External genitalia:  no lesions              Urethra:  normal appearing urethra with no masses, tenderness or lesions              Bartholins and Skenes: normal                 Vagina: normal appearing vagina with atrophic changes ***and no discharge, no lesions              Cervix: {exam; cervix:14595}              Pap taken: {yes no:314532} Bimanual Exam:  Uterus:  {exam; uterus:12215}              Adnexa: {exam; adnexa:12223}               Rectovaginal: Confirms               Anus:  normal sphincter tone, no lesions  Chaperone, ***, CMA, was present for exam.  Assessment/Plan: There are no diagnoses linked to this encounter.

## 2023-04-18 ENCOUNTER — Telehealth (HOSPITAL_BASED_OUTPATIENT_CLINIC_OR_DEPARTMENT_OTHER): Payer: Self-pay | Admitting: *Deleted

## 2023-04-18 ENCOUNTER — Ambulatory Visit (HOSPITAL_BASED_OUTPATIENT_CLINIC_OR_DEPARTMENT_OTHER): Payer: PPO | Admitting: Obstetrics & Gynecology

## 2023-04-18 NOTE — Telephone Encounter (Signed)
Called patient and left a message to call the office back to reschedule the missed appointment .

## 2023-04-19 DIAGNOSIS — M9901 Segmental and somatic dysfunction of cervical region: Secondary | ICD-10-CM | POA: Diagnosis not present

## 2023-04-19 DIAGNOSIS — M51371 Other intervertebral disc degeneration, lumbosacral region with lower extremity pain only: Secondary | ICD-10-CM | POA: Diagnosis not present

## 2023-04-19 DIAGNOSIS — M542 Cervicalgia: Secondary | ICD-10-CM | POA: Diagnosis not present

## 2023-04-19 DIAGNOSIS — M9903 Segmental and somatic dysfunction of lumbar region: Secondary | ICD-10-CM | POA: Diagnosis not present

## 2023-04-24 DIAGNOSIS — M542 Cervicalgia: Secondary | ICD-10-CM | POA: Diagnosis not present

## 2023-04-24 DIAGNOSIS — M9903 Segmental and somatic dysfunction of lumbar region: Secondary | ICD-10-CM | POA: Diagnosis not present

## 2023-04-24 DIAGNOSIS — M9901 Segmental and somatic dysfunction of cervical region: Secondary | ICD-10-CM | POA: Diagnosis not present

## 2023-04-24 DIAGNOSIS — M51371 Other intervertebral disc degeneration, lumbosacral region with lower extremity pain only: Secondary | ICD-10-CM | POA: Diagnosis not present

## 2023-05-09 NOTE — Telephone Encounter (Signed)
 Created in error

## 2023-05-15 DIAGNOSIS — R7989 Other specified abnormal findings of blood chemistry: Secondary | ICD-10-CM | POA: Diagnosis not present

## 2023-05-15 DIAGNOSIS — Z79899 Other long term (current) drug therapy: Secondary | ICD-10-CM | POA: Diagnosis not present

## 2023-05-15 DIAGNOSIS — N1831 Chronic kidney disease, stage 3a: Secondary | ICD-10-CM | POA: Diagnosis not present

## 2023-05-16 DIAGNOSIS — R808 Other proteinuria: Secondary | ICD-10-CM | POA: Diagnosis not present

## 2023-05-22 DIAGNOSIS — Z01419 Encounter for gynecological examination (general) (routine) without abnormal findings: Secondary | ICD-10-CM | POA: Diagnosis not present

## 2023-05-22 DIAGNOSIS — Z79899 Other long term (current) drug therapy: Secondary | ICD-10-CM | POA: Diagnosis not present

## 2023-05-22 DIAGNOSIS — F339 Major depressive disorder, recurrent, unspecified: Secondary | ICD-10-CM | POA: Diagnosis not present

## 2023-05-22 DIAGNOSIS — B349 Viral infection, unspecified: Secondary | ICD-10-CM | POA: Diagnosis not present

## 2023-05-22 DIAGNOSIS — Z0000 Encounter for general adult medical examination without abnormal findings: Secondary | ICD-10-CM | POA: Diagnosis not present

## 2023-05-22 DIAGNOSIS — N1831 Chronic kidney disease, stage 3a: Secondary | ICD-10-CM | POA: Diagnosis not present

## 2023-05-22 DIAGNOSIS — N1832 Chronic kidney disease, stage 3b: Secondary | ICD-10-CM | POA: Diagnosis not present

## 2023-05-22 DIAGNOSIS — Z03818 Encounter for observation for suspected exposure to other biological agents ruled out: Secondary | ICD-10-CM | POA: Diagnosis not present

## 2023-05-22 DIAGNOSIS — F330 Major depressive disorder, recurrent, mild: Secondary | ICD-10-CM | POA: Diagnosis not present

## 2023-05-22 DIAGNOSIS — C642 Malignant neoplasm of left kidney, except renal pelvis: Secondary | ICD-10-CM | POA: Diagnosis not present

## 2023-05-22 DIAGNOSIS — J45902 Unspecified asthma with status asthmaticus: Secondary | ICD-10-CM | POA: Diagnosis not present

## 2023-05-30 ENCOUNTER — Ambulatory Visit
Admission: RE | Admit: 2023-05-30 | Discharge: 2023-05-30 | Disposition: A | Discharge status: Home / Self Care | Payer: PPO | Source: Ambulatory Visit | Attending: Oncology | Admitting: Oncology

## 2023-05-30 DIAGNOSIS — Z1231 Encounter for screening mammogram for malignant neoplasm of breast: Secondary | ICD-10-CM | POA: Diagnosis not present

## 2023-06-03 ENCOUNTER — Encounter (HOSPITAL_BASED_OUTPATIENT_CLINIC_OR_DEPARTMENT_OTHER): Payer: Self-pay | Admitting: Obstetrics & Gynecology

## 2023-06-07 DIAGNOSIS — E785 Hyperlipidemia, unspecified: Secondary | ICD-10-CM | POA: Diagnosis not present

## 2023-06-07 DIAGNOSIS — R102 Pelvic and perineal pain: Secondary | ICD-10-CM | POA: Diagnosis not present

## 2023-06-07 DIAGNOSIS — Z1331 Encounter for screening for depression: Secondary | ICD-10-CM | POA: Diagnosis not present

## 2023-06-07 DIAGNOSIS — F339 Major depressive disorder, recurrent, unspecified: Secondary | ICD-10-CM | POA: Diagnosis not present

## 2023-06-07 DIAGNOSIS — C642 Malignant neoplasm of left kidney, except renal pelvis: Secondary | ICD-10-CM | POA: Diagnosis not present

## 2023-06-07 DIAGNOSIS — N1831 Chronic kidney disease, stage 3a: Secondary | ICD-10-CM | POA: Diagnosis not present

## 2023-06-07 DIAGNOSIS — R300 Dysuria: Secondary | ICD-10-CM | POA: Diagnosis not present

## 2023-06-07 DIAGNOSIS — Z124 Encounter for screening for malignant neoplasm of cervix: Secondary | ICD-10-CM | POA: Diagnosis not present

## 2023-06-07 DIAGNOSIS — Z6832 Body mass index (BMI) 32.0-32.9, adult: Secondary | ICD-10-CM | POA: Diagnosis not present

## 2023-06-07 DIAGNOSIS — I10 Essential (primary) hypertension: Secondary | ICD-10-CM | POA: Diagnosis not present

## 2023-06-07 DIAGNOSIS — Z1589 Genetic susceptibility to other disease: Secondary | ICD-10-CM | POA: Diagnosis not present

## 2023-06-07 DIAGNOSIS — R0683 Snoring: Secondary | ICD-10-CM | POA: Diagnosis not present

## 2023-06-07 DIAGNOSIS — Z1501 Genetic susceptibility to malignant neoplasm of breast: Secondary | ICD-10-CM | POA: Diagnosis not present

## 2023-06-07 DIAGNOSIS — N950 Postmenopausal bleeding: Secondary | ICD-10-CM | POA: Diagnosis not present

## 2023-06-11 DIAGNOSIS — M8588 Other specified disorders of bone density and structure, other site: Secondary | ICD-10-CM | POA: Diagnosis not present

## 2023-06-21 DIAGNOSIS — R102 Pelvic and perineal pain: Secondary | ICD-10-CM | POA: Diagnosis not present

## 2023-07-03 DIAGNOSIS — N83209 Unspecified ovarian cyst, unspecified side: Secondary | ICD-10-CM | POA: Diagnosis not present

## 2023-07-16 DIAGNOSIS — Z6832 Body mass index (BMI) 32.0-32.9, adult: Secondary | ICD-10-CM | POA: Diagnosis not present

## 2023-07-16 DIAGNOSIS — R87615 Unsatisfactory cytologic smear of cervix: Secondary | ICD-10-CM | POA: Diagnosis not present

## 2023-07-16 DIAGNOSIS — M8580 Other specified disorders of bone density and structure, unspecified site: Secondary | ICD-10-CM | POA: Diagnosis not present

## 2023-07-16 DIAGNOSIS — E669 Obesity, unspecified: Secondary | ICD-10-CM | POA: Diagnosis not present

## 2023-07-16 DIAGNOSIS — N83202 Unspecified ovarian cyst, left side: Secondary | ICD-10-CM | POA: Diagnosis not present

## 2023-07-16 DIAGNOSIS — N1831 Chronic kidney disease, stage 3a: Secondary | ICD-10-CM | POA: Diagnosis not present

## 2023-07-16 DIAGNOSIS — I10 Essential (primary) hypertension: Secondary | ICD-10-CM | POA: Diagnosis not present

## 2023-07-31 DIAGNOSIS — L720 Epidermal cyst: Secondary | ICD-10-CM | POA: Diagnosis not present

## 2023-07-31 DIAGNOSIS — D2272 Melanocytic nevi of left lower limb, including hip: Secondary | ICD-10-CM | POA: Diagnosis not present

## 2023-07-31 DIAGNOSIS — M720 Palmar fascial fibromatosis [Dupuytren]: Secondary | ICD-10-CM | POA: Diagnosis not present

## 2023-07-31 DIAGNOSIS — D2262 Melanocytic nevi of left upper limb, including shoulder: Secondary | ICD-10-CM | POA: Diagnosis not present

## 2023-07-31 DIAGNOSIS — D2261 Melanocytic nevi of right upper limb, including shoulder: Secondary | ICD-10-CM | POA: Diagnosis not present

## 2023-07-31 DIAGNOSIS — M2570 Osteophyte, unspecified joint: Secondary | ICD-10-CM | POA: Diagnosis not present

## 2023-07-31 DIAGNOSIS — D225 Melanocytic nevi of trunk: Secondary | ICD-10-CM | POA: Diagnosis not present

## 2023-07-31 DIAGNOSIS — D485 Neoplasm of uncertain behavior of skin: Secondary | ICD-10-CM | POA: Diagnosis not present

## 2023-07-31 DIAGNOSIS — L821 Other seborrheic keratosis: Secondary | ICD-10-CM | POA: Diagnosis not present

## 2023-08-01 ENCOUNTER — Telehealth: Payer: Self-pay | Admitting: *Deleted

## 2023-08-01 NOTE — Telephone Encounter (Signed)
 She states that she was at her yearly dermatology, she has  a lump sde of her knee. The dermatologist but in a referral to the surgeon. She would like you to speak to pt, to see if  you thinks she should have it done she says it is soft tissue and she has had renal cancer in past

## 2023-08-01 NOTE — Telephone Encounter (Signed)
 Dr. Cathie Hoops recommends for pt to follow dermatologist's advice to see surgeon. She can keep appt with Dr. Cathie Hoops as scheduled.

## 2023-08-08 ENCOUNTER — Ambulatory Visit: Payer: PPO | Admitting: Oncology

## 2023-08-08 ENCOUNTER — Other Ambulatory Visit: Payer: PPO

## 2023-08-20 ENCOUNTER — Ambulatory Visit
Admission: RE | Admit: 2023-08-20 | Discharge: 2023-08-20 | Disposition: A | Discharge status: Home / Self Care | Payer: PPO | Source: Ambulatory Visit | Attending: Oncology | Admitting: Oncology

## 2023-08-20 DIAGNOSIS — K5730 Diverticulosis of large intestine without perforation or abscess without bleeding: Secondary | ICD-10-CM | POA: Diagnosis not present

## 2023-08-20 DIAGNOSIS — R918 Other nonspecific abnormal finding of lung field: Secondary | ICD-10-CM | POA: Diagnosis not present

## 2023-08-20 DIAGNOSIS — Z905 Acquired absence of kidney: Secondary | ICD-10-CM | POA: Diagnosis not present

## 2023-08-20 DIAGNOSIS — Z85528 Personal history of other malignant neoplasm of kidney: Secondary | ICD-10-CM | POA: Diagnosis not present

## 2023-08-20 DIAGNOSIS — C642 Malignant neoplasm of left kidney, except renal pelvis: Secondary | ICD-10-CM | POA: Insufficient documentation

## 2023-08-21 ENCOUNTER — Ambulatory Visit: Payer: PPO | Admitting: Dietician

## 2023-08-29 ENCOUNTER — Inpatient Hospital Stay: Payer: PPO | Admitting: Oncology

## 2023-08-29 ENCOUNTER — Inpatient Hospital Stay: Payer: PPO | Attending: Oncology

## 2023-08-29 ENCOUNTER — Telehealth: Payer: Self-pay | Admitting: *Deleted

## 2023-08-29 ENCOUNTER — Encounter: Payer: Self-pay | Admitting: Oncology

## 2023-08-29 VITALS — BP 116/64 | HR 88 | Temp 98.8°F | Resp 18

## 2023-08-29 DIAGNOSIS — Z1501 Genetic susceptibility to malignant neoplasm of breast: Secondary | ICD-10-CM | POA: Diagnosis not present

## 2023-08-29 DIAGNOSIS — Z1509 Genetic susceptibility to other malignant neoplasm: Secondary | ICD-10-CM | POA: Insufficient documentation

## 2023-08-29 DIAGNOSIS — C642 Malignant neoplasm of left kidney, except renal pelvis: Secondary | ICD-10-CM

## 2023-08-29 DIAGNOSIS — R911 Solitary pulmonary nodule: Secondary | ICD-10-CM

## 2023-08-29 DIAGNOSIS — R918 Other nonspecific abnormal finding of lung field: Secondary | ICD-10-CM | POA: Diagnosis not present

## 2023-08-29 DIAGNOSIS — Z1502 Genetic susceptibility to malignant neoplasm of ovary: Secondary | ICD-10-CM

## 2023-08-29 DIAGNOSIS — Z1589 Genetic susceptibility to other disease: Secondary | ICD-10-CM | POA: Diagnosis not present

## 2023-08-29 DIAGNOSIS — F419 Anxiety disorder, unspecified: Secondary | ICD-10-CM

## 2023-08-29 DIAGNOSIS — F411 Generalized anxiety disorder: Secondary | ICD-10-CM | POA: Diagnosis not present

## 2023-08-29 DIAGNOSIS — N1831 Chronic kidney disease, stage 3a: Secondary | ICD-10-CM | POA: Diagnosis not present

## 2023-08-29 LAB — CBC WITH DIFFERENTIAL (CANCER CENTER ONLY)
Abs Immature Granulocytes: 0.02 10*3/uL (ref 0.00–0.07)
Basophils Absolute: 0 10*3/uL (ref 0.0–0.1)
Basophils Relative: 0 %
Eosinophils Absolute: 0.1 10*3/uL (ref 0.0–0.5)
Eosinophils Relative: 1 %
HCT: 40.4 % (ref 36.0–46.0)
Hemoglobin: 13.3 g/dL (ref 12.0–15.0)
Immature Granulocytes: 0 %
Lymphocytes Relative: 28 %
Lymphs Abs: 1.6 10*3/uL (ref 0.7–4.0)
MCH: 32.5 pg (ref 26.0–34.0)
MCHC: 32.9 g/dL (ref 30.0–36.0)
MCV: 98.8 fL (ref 80.0–100.0)
Monocytes Absolute: 0.5 10*3/uL (ref 0.1–1.0)
Monocytes Relative: 8 %
Neutro Abs: 3.5 10*3/uL (ref 1.7–7.7)
Neutrophils Relative %: 63 %
Platelet Count: 207 10*3/uL (ref 150–400)
RBC: 4.09 MIL/uL (ref 3.87–5.11)
RDW: 12.8 % (ref 11.5–15.5)
WBC Count: 5.6 10*3/uL (ref 4.0–10.5)
nRBC: 0 % (ref 0.0–0.2)

## 2023-08-29 LAB — CMP (CANCER CENTER ONLY)
ALT: 16 U/L (ref 0–44)
AST: 20 U/L (ref 15–41)
Albumin: 3.9 g/dL (ref 3.5–5.0)
Alkaline Phosphatase: 51 U/L (ref 38–126)
Anion gap: 7 (ref 5–15)
BUN: 26 mg/dL — ABNORMAL HIGH (ref 8–23)
CO2: 25 mmol/L (ref 22–32)
Calcium: 9.3 mg/dL (ref 8.9–10.3)
Chloride: 102 mmol/L (ref 98–111)
Creatinine: 1.32 mg/dL — ABNORMAL HIGH (ref 0.44–1.00)
GFR, Estimated: 44 mL/min — ABNORMAL LOW (ref >60)
Glucose, Bld: 105 mg/dL — ABNORMAL HIGH (ref 70–99)
Potassium: 3.5 mmol/L (ref 3.5–5.1)
Sodium: 134 mmol/L — ABNORMAL LOW (ref 135–145)
Total Bilirubin: 1.1 mg/dL (ref 0.0–1.2)
Total Protein: 7.3 g/dL (ref 6.5–8.1)

## 2023-08-29 NOTE — Assessment & Plan Note (Signed)
Due to nephrectomy. Encourage oral hydration.  Avoid nephrotoxins.

## 2023-08-29 NOTE — Assessment & Plan Note (Addendum)
#  Left kidney cancer status post nephrectomy. April 2025 CT chest abdomen pelvis wo contrast showed no recurrence.  Continue image surveillance every 6-12 months. She prefers [facility] imaging.

## 2023-08-29 NOTE — Telephone Encounter (Signed)
 The patient got the lab results and her sodium is low and her potassium. The potasium is 3.5 which is normal and the sodium 134 which is 1 point off normal. I am sending the the message about you have dizziness at times and the headache to Dr. Wilhelmenia Harada and the team.left the  message on voicemail

## 2023-08-29 NOTE — Assessment & Plan Note (Signed)
 Refer to [facility] care

## 2023-08-29 NOTE — Assessment & Plan Note (Signed)
Small lung nodules.  Observation. On CT surveillance.

## 2023-08-29 NOTE — Progress Notes (Signed)
 Pt here for follow up. Reports that she gets dizzy and lightheaded from sitting to standing. She has reported this to PCP. Pt also reports pain and swelling to legs and arms

## 2023-08-29 NOTE — Progress Notes (Signed)
 Hematology/Oncology Progress note [phone] [phone]      Patient Care Team: Sari Cunning, MD as PCP - General (Internal Medicine) Timmy Forbes, MD as Consulting Physician (Oncology)  ASSESSMENT & PLAN:   Cancer Staging  Malignant neoplasm of left kidney [facility]) Staging form: Kidney, AJCC 8th Edition - Pathologic stage from 05/24/2021: Stage I (pT1b, pN0, cM0) - Signed by Timmy Forbes, MD on 05/24/2021   Malignant neoplasm of left kidney (HCC) #Left kidney cancer status post nephrectomy. April 2025 CT chest abdomen pelvis wo contrast showed no recurrence.  Continue image surveillance every 6-12 months. She prefers Q47m imaging.   Lung nodule Small lung nodules.  Observation. On CT surveillance.   Monoallelic mutation of CHEK2 gene in female patient Recommend annual mammogram with annual breast MRI Patient has started colon cancer screening.  Recommend colonoscopy every 5 years   CKD (chronic kidney disease) stage 3, GFR 30-59 ml/min (HCC) Due to nephrectomy. Encourage oral hydration.  Avoid nephrotoxins.  Anxiety Refer to [facility]    Orders Placed This Encounter  Procedures   CT CHEST ABDOMEN PELVIS WO CONTRAST    Standing Status:   Future    Expected Date:   02/28/2024    Expiration Date:   08/28/2024    Preferred imaging location?:   [facility]    If indicated for the ordered procedure, I authorize the administration of oral contrast media per Radiology protocol:   Yes    Does the patient have a contrast media/X-ray dye allergy?:   No   MR BREAST BILATERAL W WO CONTRAST INC CAD    Standing Status:   Future    Expected Date:   11/28/2023    Expiration Date:   08/29/2024    If indicated for the ordered procedure, I authorize the administration of contrast media per Radiology protocol:   Yes    What is the patient's sedation requirement?:   No Sedation    Does the patient have a pacemaker or implanted devices?:   No    Call Results- Best  Contact Number?:   CHEK2+, strong family history of cancer, high risk breast cancer    Radiology Contrast Protocol - do NOT remove file path:   [email or web address]    Preferred imaging location?:   [facility] (table limit - 550lbs)   CMP (Cancer Center only)    Standing Status:   Future    Expected Date:   02/28/2024    Expiration Date:   08/28/2024   CBC with Differential (Cancer Center Only)    Standing Status:   Future    Expected Date:   02/28/2024    Expiration Date:   08/28/2024   Lactate dehydrogenase    Standing Status:   Future    Expected Date:   02/28/2024    Expiration Date:   08/28/2024   Ambulatory Referral to [facility]    Referral Priority:   Routine    Referral Type:   Consultation    Referral Reason:   Specialty Services Required    Requested Specialty:   Oncology    Number of Visits Requested:   1   Follow up in 6 months.  All questions were answered. The patient knows to call the clinic with any problems, questions or concerns.  Timmy Forbes, MD, PhD [facility] Hematology Oncology 08/29/2023   CHIEF COMPLAINTS/REASON FOR VISIT:  High risk for development of breast cancer, history of kidney cancer  HISTORY OF PRESENTING ILLNESS:   Carla Abrahams  ZAMORA Fuentes is a  68 y.o.  female presents for heterozygous CHEK2 mutation, history of kidney cancer,  Patient reports that her brother has been sick for the past few years due to unintentional weight loss and was eventually diagnosed with gastric cancer and passed away.  Brother told patient " to check breast cancer gene". She mentioned to gynecologist and was referred to genetic counselor for testing.    08/10/2020, patient had a genetic testing was tested positive for heterozygous CHEK2 Mother was adopted. Family history is positive for brother with stomach cancer, paternal aunt ovarian cancer, another paternal aunt cervical cancer, paternal uncle cancer with unknown details, paternal  cousin pancreatic cancer, another cousin with blood cancer, second paternal cousin lung cancer, niece thyroid  cancer  Oncology History Overview Note  RCC was managed by her Energy manager.    Malignant neoplasm of left kidney (HCC)  11/21/2020 Imaging    CT abdomen pelvis wo contrast Intermediate attenuation mass at upper pole of LEFT kidney 4.7 x 4.6 x 4.6 cm    11/28/2020 Imaging   MRI abdomen w wo contrast: heterogeneously contrast enhancing, partially exophytic mass of the lateral superior pole of the left kidney measuring 4.7 x 4.6 cm, consistent with renal cell carcinoma. Although not definitive, appearance of the left renal vein is suspicious for renal vein invasion   12/05/2020 Initial Diagnosis   Malignant neoplasm of left kidney (HCC)  01/18/2021 left nephrectomy and adrenalectomy, left perihilar lymphadenectomy.  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  Benign adrenal gland.  1 lymph node was examined and was negative for carcinoma. pT1b pN0    05/24/2021 Cancer Staging   Staging form: Kidney, AJCC 8th Edition - Pathologic stage from 05/24/2021: Stage I (pT1b, pN0, cM0) - Signed by Timmy Forbes, MD on 05/24/2021 Stage prefix: Initial diagnosis   08/18/2021 Imaging    CT RCC protocol abdomen pelvis with and without contrast at [facility] showed status post left nephrectomy without evidence of recurrence or metastatic disease.  Nonspecific left inguinal lymphadenopathy.  Attention on follow-up.  Left ovarian cyst can be assessed for stability on follow-up imaging.     08/21/2021 Procedure    patient had hysteroscopy, D&C, benign endometrial biopsy.   02/28/2022 Imaging   CT abdomen pelvis without contrast showed 1. Status post left nephrectomy without evidence of local recurrence. 2. No evidence of metastatic disease in the abdomen or pelvis. 3. Right middle lobe 4 mm pulmonary nodule is nonspecific, and was not seen on recent prior imaging studies although this appears to be outside  of the few field of view on those examinations. Suggest follow-up dedicated chest CT for further evaluation. 4. Cystic left ovarian lesions previously evaluated by pelvic ultrasound June 06, 2021 and characterized as 3 adjacent renal cysts measuring up to 3.6 cm appear unchanged from CT November 21, 2020. 5. Sigmoid colonic diverticulosis without findings of acute diverticulitis   03/13/2022 Imaging   CT chest with contrast 1. 4 mm right middle lobe pulmonary nodule is stable in the interval since prior abdomen/pelvis CT and also comparing back to remote chest CT of 05/18/2011. No followup imaging is recommended. 2. 3 mm subpleural left upper lobe pulmonary nodule is new, likely benign. Although likely benign, a non-contrast chest CT can be considered in 12 months.    08/20/2023 Imaging   CT chest abdomen pelvis wo contrast  1. Status post left nephrectomy and adrenalectomy without evidence of local recurrence or metastatic disease in the chest, abdomen or pelvis on  noncontrast enhanced CT. 2. Stable scattered tiny pulmonary nodules, likely benign. 3. Similar appearance of the macrolobulated 4.7 cm left ovarian lesion previously evaluated on pelvic ultrasounds most recent as November 19, 2022. 4.  Aortic Atherosclerosis    09/28/2023 Imaging    CT scan showed no signs of disease recurrence.  Stable and unchanged small lung nodules, kidney cyst, left adnexal cyst     09/04/2021, seen by cardiology for family history of hypertrophic cardiomyopathy. 09/11/2021, bilateral breast MRI with and without contrast showed no evidence of breast malignancy.  INTERVAL HISTORY CIENA SAMPLEY is a 68 y.o. female who has above history reviewed by me today presents for follow up visit for management of  At the risk of developing breast cancer, history of left renal cell carcinoma. She has experienced left flank pain intermittently. . No fever chills.  ? Left lower extremity soft tissue mass? Lipoma, she has been  referred to surgery for evaluation.    Review of Systems  Constitutional:  Negative for appetite change, chills, fatigue and fever.  HENT:   Negative for hearing loss and voice change.   Eyes:  Negative for eye problems.  Respiratory:  Negative for chest tightness and cough.   Cardiovascular:  Negative for chest pain.  Gastrointestinal:  Negative for abdominal distention, abdominal pain and blood in stool.       Left flank pressure  Endocrine: Negative for hot flashes.  Genitourinary:  Negative for difficulty urinating and frequency.   Musculoskeletal:  Negative for arthralgias.  Skin:  Negative for itching and rash.  Neurological:  Negative for extremity weakness.  Hematological:  Negative for adenopathy.  Psychiatric/Behavioral:  Negative for confusion.     MEDICAL HISTORY:  Past Medical History:  Diagnosis Date   Abnormal Pap smear    Anxiety    Basal cell adenocarcinoma    skin, face   Colitis 01/05/2015   treated wtih antibiotics   Depression    Family history of lung cancer    Family history of ovarian cancer    Family history of pancreatic cancer    Family history of stomach cancer    Family history of thyroid  cancer    GERD (gastroesophageal reflux disease)    History of renal cell cancer 2022   s/p left nephrectomy at [facility]   Hypertension    started on medication for LEE   Positive test for genetic breast cancer susceptibility marker    CHEK2+ gene   TIA (transient ischemic attack)    initially question as TIA, evaluation negative.  Neurologist felt this was anxiety related.    SURGICAL HISTORY: Past Surgical History:  Procedure Laterality Date   CERVICAL BIOPSY  W/ LOOP ELECTRODE EXCISION     CESAREAN SECTION     2, FTP with fetal distress   COLONOSCOPY  01/06/2015   repeat 5 years   COLONOSCOPY WITH PROPOFOL  N/A 05/30/2020   Procedure: COLONOSCOPY WITH PROPOFOL ;  Surgeon: Conrado Delay, DO;  Location: [facility] ENDOSCOPY;  Service: General;  Laterality: N/A;    ESOPHAGOGASTRODUODENOSCOPY N/A 05/30/2020   Procedure: ESOPHAGOGASTRODUODENOSCOPY (EGD);  Surgeon: Conrado Delay, DO;  Location: [facility] ENDOSCOPY;  Service: General;  Laterality: N/A;   HEMORRHOID BANDING  11/05/2014   Dr. Felipe Horton,    HYSTEROSCOPY WITH D & C  08/21/2021   Dr. Curry Dove at [facility]   NEPHRECTOMY Left 01/18/2021   Dr. Gattis Kass, [facility].  Renal cell ca.   OOPHORECTOMY  05/08/1999   for recurrent ovarian cyst    SOCIAL  HISTORY: Social History   Socioeconomic History   Marital status: Married    Spouse name: Not on file   Number of children: Not on file   Years of education: Not on file   Highest education level: Not on file  Occupational History    Employer: SELF EMPLOYED    Comment: [employer]  Tobacco Use   Smoking status: Never   Smokeless tobacco: Never  Vaping Use   Vaping status: Never Used  Substance and Sexual Activity   Alcohol use: Yes    Alcohol/week: 0.0 - 1.0 standard drinks of alcohol   Drug use: No   Sexual activity: Yes    Partners: Male    Birth control/protection: Post-menopausal    Comment: husband vasectomy  Other Topics Concern   Not on file  Social History Narrative   Lives with husband, remarried. 2 children, 33YO and 35YO, one child lives [place].      Work - Physicist, medical      Diet - regular diet      Exercise - works out 3-4 times per week   Social Drivers of [employer]: Low Risk  (06/07/2023)   Received from [facility]   Overall Financial Resource Strain (CARDIA)    Difficulty of Paying Living Expenses: Not very hard  Food Insecurity: No Food Insecurity (06/07/2023)   Received from [facility]   Hunger Vital Sign    Worried About Running Out of Food in the Last Year: Never true    Ran Out of Food in the Last Year: Never true  Transportation Needs: No Transportation Needs (06/07/2023)   Received from [facility] - Transportation    In the past 12 months, has lack of transportation kept you from medical appointments or from getting medications?: No    Lack of Transportation (Non-Medical): No  Physical Activity: Not on file  Stress: Not on file  Social Connections: Not on file  Intimate Partner Violence: Not on file    FAMILY HISTORY: Family History  Problem Relation Age of Onset   Diabetes Mother    Hypertension Mother    Heart disease Father    Diabetes Brother    Stomach cancer Brother    Ovarian cancer Paternal Aunt    Cancer Paternal Uncle        unk type   Cervical cancer Paternal Aunt    Pancreatic cancer Cousin        d. 31s   Cancer Cousin        blood cancer   Other Cousin        d. brain tumor 30s/40s   Lung cancer Cousin        x2; hx smoking   Cancer Cousin        metastatic   Thyroid  cancer Niece        dx 30s, neg. GT    ALLERGIES:  is allergic to penicillins.  MEDICATIONS:  Current Outpatient Medications  Medication Sig Dispense Refill   ALPRAZolam  (XANAX ) 0.25 MG tablet Take 1 tablet (0.25 mg total) by mouth 3 (three) times daily as needed for sleep or anxiety (Take 1 tablet by mouth every 8 hours as needed for anxiety). 30 tablet 1   CRANBERRY PO Take by mouth.     FLUoxetine  (PROZAC ) 40 MG capsule Take 40 mg by mouth daily.     losartan (COZAAR) 50 MG tablet Take  50 mg by mouth daily.     MAGNESIUM PO Take by mouth daily.     Omega-3 Fatty Acids (FISH OIL PEARLS) 183.33 MG CAPS Take by mouth.     temazepam  (RESTORIL ) 30 MG capsule TAKE 1 CAPSULE AT BEDTIME AS NEEDED INSOMNIA 30 capsule 5   triamterene -hydrochlorothiazide (DYAZIDE) 37.5-25 MG capsule Take 1 capsule by mouth every morning.     No current facility-administered medications for this visit.     PHYSICAL EXAMINATION: ECOG PERFORMANCE STATUS: 0 - Asymptomatic Vitals:   08/29/23 1055  BP: 116/64  Pulse: 88  Resp: 18  Temp: 98.8 F (37.1 C)   Filed Weights     Physical  Exam Constitutional:      General: She is not in acute distress. HENT:     Head: Normocephalic and atraumatic.  Eyes:     General: No scleral icterus. Cardiovascular:     Rate and Rhythm: Normal rate.  Pulmonary:     Effort: Pulmonary effort is normal. No respiratory distress.  Abdominal:     General: There is no distension.  Musculoskeletal:        General: No deformity. Normal range of motion.     Cervical back: Normal range of motion.  Skin:    Findings: No rash.  Neurological:     Mental Status: She is alert and oriented to person, place, and time. Mental status is at baseline.     Cranial Nerves: No cranial nerve deficit.  Psychiatric:     Comments: Anxious     LABORATORY DATA:  I have reviewed the data as listed    Latest Ref Rng & Units 08/29/2023   10:43 AM 02/07/2023    9:52 AM 04/04/2022    1:57 PM  CBC  WBC 4.0 - 10.5 K/uL 5.6  4.6  4.9   Hemoglobin 12.0 - 15.0 g/dL 04.5  40.9  81.1   Hematocrit 36.0 - 46.0 % 40.4  38.7  39.7   Platelets 150 - 400 K/uL 207  167  172       Latest Ref Rng & Units 08/29/2023   10:43 AM 02/07/2023    9:52 AM 04/04/2022    1:57 PM  CMP  Glucose 70 - 99 mg/dL 914  95  94   BUN 8 - 23 mg/dL 26  21  25    Creatinine 0.44 - 1.00 mg/dL 7.82  9.56  2.13   Sodium 135 - 145 mmol/L 134  129  138   Potassium 3.5 - 5.1 mmol/L 3.5  4.2  4.3   Chloride 98 - 111 mmol/L 102  97  104   CO2 22 - 32 mmol/L 25  27  27    Calcium 8.9 - 10.3 mg/dL 9.3  9.2  9.2   Total Protein 6.5 - 8.1 g/dL 7.3  6.9  7.1   Total Bilirubin 0.0 - 1.2 mg/dL 1.1  0.8  0.5   Alkaline Phos 38 - 126 U/L 51  60  63   AST 15 - 41 U/L 20  17  20    ALT 0 - 44 U/L 16  15  17      RADIOGRAPHIC STUDIES: I have personally reviewed the radiological images as listed and agreed with the findings in the report. CT CHEST ABDOMEN PELVIS WO CONTRAST Result Date: 08/25/2023 CLINICAL DATA:  History of renal cell carcinoma, follow-up. * Tracking Code: BO * EXAM: CT CHEST, ABDOMEN  AND PELVIS WITHOUT CONTRAST TECHNIQUE: Multidetector CT imaging of the chest, abdomen  and pelvis was performed following the standard protocol without IV contrast. RADIATION DOSE REDUCTION: This exam was performed according to the departmental dose-optimization program which includes automated exposure control, adjustment of the mA and/or kV according to patient size and/or use of iterative reconstruction technique. COMPARISON:  Multiple priors including most recent CT February 13, 2023 FINDINGS: CT CHEST FINDINGS Cardiovascular: Aortic atherosclerosis. Normal size heart. No significant pericardial effusion/thickening. Mediastinum/Nodes: No suspicious thyroid  nodule. No pathologically enlarged mediastinal, hilar or axillary lymph nodes. Esophagus is grossly unremarkable. Lungs/Pleura: Stable scattered tiny pulmonary nodules. For reference: -left upper lobe pulmonary nodule measures 3 mm on image 36/3, unchanged. -right middle lobe pulmonary nodule measures 4 mm on image 78/3, unchanged. -subtle right lower lobe ground-glass pulmonary nodule measures 3 mm on image 97/3, unchanged. No new suspicious pulmonary nodules or masses. Musculoskeletal: No aggressive lytic or blastic lesion of bone. CT ABDOMEN PELVIS FINDINGS Hepatobiliary: No suspicious hepatic lesion on noncontrast enhanced examination. Gallbladder is unremarkable. No biliary ductal dilation. Pancreas: No pancreatic ductal dilation or evidence of acute inflammation. Spleen: No splenomegaly. Adrenals/Urinary Tract: Right adrenal gland is unremarkable. No right-sided hydronephrosis. Left kidney and adrenal gland are surgically absent without new suspicious nodularity identified in the surgical bed. Stomach/Bowel: No evidence of bowel obstruction or acute bowel inflammation. Sigmoid colonic diverticulosis. Vascular/Lymphatic: Normal caliber abdominal aorta. No pathologically enlarged abdominal or pelvic lymph nodes. Reproductive: Macrolobulated 4.7 cm left  ovarian lesion on image 98/2 previously evaluated on pelvic ultrasounds most recent as November 19, 2022. Other: No significant abdominopelvic free fluid. Musculoskeletal: No aggressive lytic or blastic lesion of bone. IMPRESSION: 1. Status post left nephrectomy and adrenalectomy without evidence of local recurrence or metastatic disease in the chest, abdomen or pelvis on noncontrast enhanced CT. 2. Stable scattered tiny pulmonary nodules, likely benign. 3. Similar appearance of the macrolobulated 4.7 cm left ovarian lesion previously evaluated on pelvic ultrasounds most recent as November 19, 2022. 4.  Aortic Atherosclerosis (ICD10-I70.0). Electronically Signed   By: Tama Fails M.D.   On: 08/25/2023 10:54

## 2023-08-29 NOTE — Assessment & Plan Note (Signed)
Recommend annual mammogram with annual breast MRI Patient has started colon cancer screening.  Recommend colonoscopy every 5 years

## 2023-09-03 NOTE — Telephone Encounter (Signed)
 Mychart message sent and Detailed VM left with MD response.

## 2023-09-17 DIAGNOSIS — Z713 Dietary counseling and surveillance: Secondary | ICD-10-CM | POA: Diagnosis not present

## 2023-09-17 DIAGNOSIS — I10 Essential (primary) hypertension: Secondary | ICD-10-CM | POA: Diagnosis not present

## 2023-09-17 DIAGNOSIS — Z6832 Body mass index (BMI) 32.0-32.9, adult: Secondary | ICD-10-CM | POA: Diagnosis not present

## 2023-09-17 DIAGNOSIS — N898 Other specified noninflammatory disorders of vagina: Secondary | ICD-10-CM | POA: Diagnosis not present

## 2023-09-17 DIAGNOSIS — N1831 Chronic kidney disease, stage 3a: Secondary | ICD-10-CM | POA: Diagnosis not present

## 2023-09-17 DIAGNOSIS — R87615 Unsatisfactory cytologic smear of cervix: Secondary | ICD-10-CM | POA: Diagnosis not present

## 2023-09-17 DIAGNOSIS — N83209 Unspecified ovarian cyst, unspecified side: Secondary | ICD-10-CM | POA: Diagnosis not present

## 2023-09-17 DIAGNOSIS — Z124 Encounter for screening for malignant neoplasm of cervix: Secondary | ICD-10-CM | POA: Diagnosis not present

## 2023-09-24 ENCOUNTER — Ambulatory Visit (HOSPITAL_BASED_OUTPATIENT_CLINIC_OR_DEPARTMENT_OTHER): Admitting: Internal Medicine

## 2023-09-24 ENCOUNTER — Encounter (HOSPITAL_BASED_OUTPATIENT_CLINIC_OR_DEPARTMENT_OTHER): Payer: Self-pay

## 2023-09-24 DIAGNOSIS — Z905 Acquired absence of kidney: Secondary | ICD-10-CM | POA: Diagnosis not present

## 2023-09-24 DIAGNOSIS — E871 Hypo-osmolality and hyponatremia: Secondary | ICD-10-CM | POA: Diagnosis not present

## 2023-09-24 DIAGNOSIS — N1832 Chronic kidney disease, stage 3b: Secondary | ICD-10-CM | POA: Diagnosis not present

## 2023-09-24 NOTE — Progress Notes (Signed)
 Dear Referring Specialist: Thank you for referring your patient to [facility]. Below you will find my new psychiatric medication recommendations.   Once reviewed, please confirm prescription(s) within 2 business days. If you modify or decline the recommendations, please document these modifications and notify us . If you have any questions or concerns, please call our [facility] provider line at [phone].   Best,  [place]  Annice Kim, MD   Name Carla Fuentes, Carla Fuentes  Date of Birth 1955/10/10  Referring Provider: Dr. Wilhelmenia Harada  Patient MRN: [id number]   1. Medication (name, dose, instructions) Increase fluoxetine  to 60mg  daily (40mg  + 20mg ) Reason for Recommendation: addressing anxiety  1. Medication Recommendation Details: Increase Dose

## 2023-09-25 NOTE — Progress Notes (Signed)
 Note: Member is open to med recs, but is concerned about weight generally.  New member   Reviewed at IDT? Psychiatric Consultant, Coach, and [facility] consulted/reviewed Member during weekly Interdisciplinary Team Meeting.  IDT Outcomes Discuss at Next IDT IDT Outcome Details (Psychiatric consultant clinical feedback, details about next-steps, reason for change in diagnosis, etc....) Med reach consideration - Increase Fluoxetine  to 60 mg  Buspirone possibly - PC to review for confirmation  Follow-Up Required? Yes  Provider to Follow-Up [facility]  Member is  open to psychiatric medication recommendations.   Assessments:  Carla Fuentes scored 11 on the PHQ-9 reporting symptoms of moderate.   Safety Concerns: None at this time  Carla Fuentes scored 18 on the GAD-7 reporting symptoms of moderate .   Final Intake Summary [facility] has a scheduled follow-up visit with member 09/25/23 at 1200 est; [facility] will fax med recc from psychiatrist to patients provider to review. Once reviewed, [facility] will provide psycho education to member at visit.

## 2023-10-03 NOTE — Progress Notes (Signed)
 Patient enrolled in [facility] services on 09/02/2023 and initial behavioral health evaluation was conducted on 09/11/2023.

## 2023-10-05 DIAGNOSIS — F330 Major depressive disorder, recurrent, mild: Secondary | ICD-10-CM | POA: Diagnosis not present

## 2023-10-05 DIAGNOSIS — F419 Anxiety disorder, unspecified: Secondary | ICD-10-CM | POA: Diagnosis not present

## 2023-10-05 DIAGNOSIS — F3289 Other specified depressive episodes: Secondary | ICD-10-CM | POA: Diagnosis not present

## 2023-10-05 DIAGNOSIS — C642 Malignant neoplasm of left kidney, except renal pelvis: Secondary | ICD-10-CM | POA: Diagnosis not present

## 2023-10-31 DIAGNOSIS — I890 Lymphedema, not elsewhere classified: Secondary | ICD-10-CM | POA: Diagnosis not present

## 2023-10-31 DIAGNOSIS — I872 Venous insufficiency (chronic) (peripheral): Secondary | ICD-10-CM | POA: Diagnosis not present

## 2023-10-31 DIAGNOSIS — I83893 Varicose veins of bilateral lower extremities with other complications: Secondary | ICD-10-CM | POA: Diagnosis not present

## 2023-10-31 DIAGNOSIS — R600 Localized edema: Secondary | ICD-10-CM | POA: Diagnosis not present

## 2023-11-04 DIAGNOSIS — C642 Malignant neoplasm of left kidney, except renal pelvis: Secondary | ICD-10-CM | POA: Diagnosis not present

## 2023-11-04 DIAGNOSIS — F330 Major depressive disorder, recurrent, mild: Secondary | ICD-10-CM | POA: Diagnosis not present

## 2023-11-04 DIAGNOSIS — F419 Anxiety disorder, unspecified: Secondary | ICD-10-CM | POA: Diagnosis not present

## 2023-11-13 ENCOUNTER — Telehealth: Payer: Self-pay | Admitting: *Deleted

## 2023-11-13 NOTE — Telephone Encounter (Signed)
 The patient states that she has been having this swelling of the right arm pit 3 to 4 months.  Not sore.  Dr. Babara says that she could come in on July 16th patient says that she already sees it in MyChart.

## 2023-11-13 NOTE — Telephone Encounter (Signed)
 Patient called today stating that her right armpit is swollen and had a fist size she already has kidney cancer it does not have any problem on the left side but the right side is the swollen 1 and she feels like she needs to have someone to see her.

## 2023-11-19 DIAGNOSIS — Z79899 Other long term (current) drug therapy: Secondary | ICD-10-CM | POA: Diagnosis not present

## 2023-11-20 ENCOUNTER — Encounter: Payer: Self-pay | Admitting: Oncology

## 2023-11-20 ENCOUNTER — Inpatient Hospital Stay: Attending: Oncology | Admitting: Oncology

## 2023-11-20 VITALS — BP 130/83 | HR 66 | Temp 97.5°F | Resp 18

## 2023-11-20 DIAGNOSIS — Z1509 Genetic susceptibility to other malignant neoplasm: Secondary | ICD-10-CM | POA: Insufficient documentation

## 2023-11-20 DIAGNOSIS — Z800 Family history of malignant neoplasm of digestive organs: Secondary | ICD-10-CM | POA: Insufficient documentation

## 2023-11-20 DIAGNOSIS — Z1502 Genetic susceptibility to malignant neoplasm of ovary: Secondary | ICD-10-CM

## 2023-11-20 DIAGNOSIS — R2231 Localized swelling, mass and lump, right upper limb: Secondary | ICD-10-CM | POA: Diagnosis not present

## 2023-11-20 DIAGNOSIS — Z801 Family history of malignant neoplasm of trachea, bronchus and lung: Secondary | ICD-10-CM | POA: Diagnosis not present

## 2023-11-20 DIAGNOSIS — Z1501 Genetic susceptibility to malignant neoplasm of breast: Secondary | ICD-10-CM | POA: Diagnosis not present

## 2023-11-20 DIAGNOSIS — M7989 Other specified soft tissue disorders: Secondary | ICD-10-CM | POA: Insufficient documentation

## 2023-11-20 DIAGNOSIS — F419 Anxiety disorder, unspecified: Secondary | ICD-10-CM | POA: Insufficient documentation

## 2023-11-20 DIAGNOSIS — C642 Malignant neoplasm of left kidney, except renal pelvis: Secondary | ICD-10-CM | POA: Insufficient documentation

## 2023-11-20 DIAGNOSIS — Z1589 Genetic susceptibility to other disease: Secondary | ICD-10-CM

## 2023-11-20 DIAGNOSIS — Z808 Family history of malignant neoplasm of other organs or systems: Secondary | ICD-10-CM | POA: Insufficient documentation

## 2023-11-20 NOTE — Assessment & Plan Note (Signed)
 I am not able to appreciate any discrete mass.  Patient is very anxious and requests work up to be done.  Obtain US  right elbow soft tissue.

## 2023-11-20 NOTE — Progress Notes (Signed)
 Pt reports swelling to right armpit area, that has been going on for months. Pt also reports occasional pressure to chest and back. Pt also reports dry cough

## 2023-11-20 NOTE — Assessment & Plan Note (Signed)
#  Left kidney cancer status post nephrectomy. April 2025 CT chest abdomen pelvis wo contrast showed no recurrence.  Continue image surveillance every 6-12 months. She prefers Q74m imaging. Next due Oct 2025

## 2023-11-20 NOTE — Assessment & Plan Note (Signed)
Recommend annual mammogram with annual breast MRI Patient has started colon cancer screening.  Recommend colonoscopy every 5 years

## 2023-11-20 NOTE — Progress Notes (Signed)
 Hematology/Oncology Progress note [phone] [phone]      Patient Care Team: Cleotilde Oneil FALCON, MD as PCP - General (Internal Medicine) Babara Call, MD as Consulting Physician (Oncology)  ASSESSMENT & PLAN:   Cancer Staging  Malignant neoplasm of left kidney [facility]) Staging form: Kidney, AJCC 8th Edition - Pathologic stage from 05/24/2021: Stage I (pT1b, pN0, cM0) - Signed by Babara Call, MD on 05/24/2021   Malignant neoplasm of left kidney (HCC) #Left kidney cancer status post nephrectomy. April 2025 CT chest abdomen pelvis wo contrast showed no recurrence.  Continue image surveillance every 6-12 months. She prefers Q15m imaging. Next due Oct 2025  Monoallelic mutation of CHEK2 gene in female patient Recommend annual mammogram with annual breast MRI Patient has started colon cancer screening.  Recommend colonoscopy every 5 years   Right axillary swelling Patient is very anxious. Physical examination showed no palpable lymphadenopathy Obtain right axillary US   Elbow mass, right I am not able to appreciate any discrete mass.  Patient is very anxious and requests work up to be done.  Obtain US  right elbow soft tissue.   Anxiety Cerula recommends to increase fluoxetine  to 60mg  daily. Patient plan to discuss with Dr. Cleotilde who is managing her anxiety.  Encourage patient to further discuss hypochondria management.     Orders Placed This Encounter  Procedures   US  RT UPPER EXTREM LTD SOFT TISSUE NON VASCULAR    Standing Status:   Future    Expected Date:   11/27/2023    Expiration Date:   11/19/2024    Reason for Exam (SYMPTOM  OR DIAGNOSIS REQUIRED):   right arm swelling, right armpit swelling    Preferred imaging location?:   [facility]   US  LIMITED ULTRASOUND INCLUDING AXILLA RIGHT BREAST    Standing Status:   Future    Expected Date:   11/27/2023    Expiration Date:   11/19/2024    Reason for Exam (SYMPTOM  OR DIAGNOSIS REQUIRED):   spoke to [place],  informed her that the plan is for pt to get b12 levels rechecked on 7/28. Dr. Babara will see pt on 7/31 and if B12 inj are needed (based on lab work), then more B12 inj will be sent. Lori verbalized understanding.    Preferred imaging location?:   [facility]   Follow up - keep same appt All questions were answered. The patient knows to call the clinic with any problems, questions or concerns.  Call Babara, MD, PhD [facility] Hematology Oncology 11/20/2023   CHIEF COMPLAINTS/REASON FOR VISIT:  High risk for development of breast cancer, history of kidney cancer  HISTORY OF PRESENTING ILLNESS:   Carla Fuentes is a  68 y.o.  female presents for heterozygous CHEK2 mutation, history of kidney cancer,  Patient reports that her brother has been sick for the past few years due to unintentional weight loss and was eventually diagnosed with gastric cancer and passed away.  Brother told patient  to check breast cancer gene. She mentioned to gynecologist and was referred to genetic counselor for testing.    08/10/2020, patient had a genetic testing was tested positive for heterozygous CHEK2 Mother was adopted. Family history is positive for brother with stomach cancer, paternal aunt ovarian cancer, another paternal aunt cervical cancer, paternal uncle cancer with unknown details, paternal cousin pancreatic cancer, another cousin with blood cancer, second paternal cousin lung cancer, niece thyroid  cancer  Oncology History Overview Note  RCC was managed by her Energy manager.  Malignant neoplasm of left kidney (HCC)  11/21/2020 Imaging    CT abdomen pelvis wo contrast Intermediate attenuation mass at upper pole of LEFT kidney 4.7 x 4.6 x 4.6 cm    11/28/2020 Imaging   MRI abdomen w wo contrast: heterogeneously contrast enhancing, partially exophytic mass of the lateral superior pole of the left kidney measuring 4.7 x 4.6 cm, consistent with renal cell carcinoma. Although not definitive,  appearance of the left renal vein is suspicious for renal vein invasion   12/05/2020 Initial Diagnosis   Malignant neoplasm of left kidney (HCC)  01/18/2021 left nephrectomy and adrenalectomy, left perihilar lymphadenectomy.  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  Benign adrenal gland.  1 lymph node was examined and was negative for carcinoma. pT1b pN0    05/24/2021 Cancer Staging   Staging form: Kidney, AJCC 8th Edition - Pathologic stage from 05/24/2021: Stage I (pT1b, pN0, cM0) - Signed by Babara Call, MD on 05/24/2021 Stage prefix: Initial diagnosis   08/18/2021 Imaging    CT RCC protocol abdomen pelvis with and without contrast at [facility] showed status post left nephrectomy without evidence of recurrence or metastatic disease.  Nonspecific left inguinal lymphadenopathy.  Attention on follow-up.  Left ovarian cyst can be assessed for stability on follow-up imaging.     08/21/2021 Procedure    patient had hysteroscopy, D&C, benign endometrial biopsy.   02/28/2022 Imaging   CT abdomen pelvis without contrast showed 1. Status post left nephrectomy without evidence of local recurrence. 2. No evidence of metastatic disease in the abdomen or pelvis. 3. Right middle lobe 4 mm pulmonary nodule is nonspecific, and was not seen on recent prior imaging studies although this appears to be outside of the few field of view on those examinations. Suggest follow-up dedicated chest CT for further evaluation. 4. Cystic left ovarian lesions previously evaluated by pelvic ultrasound June 06, 2021 and characterized as 3 adjacent renal cysts measuring up to 3.6 cm appear unchanged from CT November 21, 2020. 5. Sigmoid colonic diverticulosis without findings of acute diverticulitis   03/13/2022 Imaging   CT chest with contrast 1. 4 mm right middle lobe pulmonary nodule is stable in the interval since prior abdomen/pelvis CT and also comparing back to remote chest CT of 05/18/2011. No followup imaging is  recommended. 2. 3 mm subpleural left upper lobe pulmonary nodule is new, likely benign. Although likely benign, a non-contrast chest CT can be considered in 12 months.    08/20/2023 Imaging   CT chest abdomen pelvis wo contrast  1. Status post left nephrectomy and adrenalectomy without evidence of local recurrence or metastatic disease in the chest, abdomen or pelvis on noncontrast enhanced CT. 2. Stable scattered tiny pulmonary nodules, likely benign. 3. Similar appearance of the macrolobulated 4.7 cm left ovarian lesion previously evaluated on pelvic ultrasounds most recent as November 19, 2022. 4.  Aortic Atherosclerosis    09/28/2023 Imaging    CT scan showed no signs of disease recurrence.  Stable and unchanged small lung nodules, kidney cyst, left adnexal cyst     09/04/2021, seen by cardiology for family history of hypertrophic cardiomyopathy. 09/11/2021, bilateral breast MRI with and without contrast showed no evidence of breast malignancy.  INTERVAL HISTORY Carla Fuentes is a 68 y.o. female who has above history reviewed by me today presents for follow up visit for management of  At the risk of developing breast cancer, history of left renal cell carcinoma. She reports feeling right arm pit swelling for several weeks.  Also she has felt a mass located around her right elbow. She is concerned that the mass is cancerous.  No fever chills.     Review of Systems  Constitutional:  Negative for appetite change, chills, fatigue and fever.  HENT:   Negative for hearing loss and voice change.   Eyes:  Negative for eye problems.  Respiratory:  Negative for chest tightness and cough.   Cardiovascular:  Negative for chest pain.  Gastrointestinal:  Negative for abdominal distention, abdominal pain and blood in stool.  Endocrine: Negative for hot flashes.  Genitourinary:  Negative for difficulty urinating and frequency.   Musculoskeletal:  Negative for arthralgias.  Skin:  Negative for  itching and rash.  Neurological:  Negative for extremity weakness.  Hematological:  Negative for adenopathy.  Psychiatric/Behavioral:  Negative for confusion. The patient is nervous/anxious.   See HPI  MEDICAL HISTORY:  Past Medical History:  Diagnosis Date   Abnormal Pap smear    Anxiety    Basal cell adenocarcinoma    skin, face   Colitis 01/05/2015   treated wtih antibiotics   Depression    Family history of lung cancer    Family history of ovarian cancer    Family history of pancreatic cancer    Family history of stomach cancer    Family history of thyroid  cancer    GERD (gastroesophageal reflux disease)    History of renal cell cancer 2022   s/p left nephrectomy at [facility]   Hypertension    started on medication for LEE   Positive test for genetic breast cancer susceptibility marker    CHEK2+ gene   TIA (transient ischemic attack)    initially question as TIA, evaluation negative.  Neurologist felt this was anxiety related.    SURGICAL HISTORY: Past Surgical History:  Procedure Laterality Date   CERVICAL BIOPSY  W/ LOOP ELECTRODE EXCISION     CESAREAN SECTION     2, FTP with fetal distress   COLONOSCOPY  01/06/2015   repeat 5 years   COLONOSCOPY WITH PROPOFOL  N/A 05/30/2020   Procedure: COLONOSCOPY WITH PROPOFOL ;  Surgeon: Tye Millet, DO;  Location: [facility] ENDOSCOPY;  Service: General;  Laterality: N/A;   ESOPHAGOGASTRODUODENOSCOPY N/A 05/30/2020   Procedure: ESOPHAGOGASTRODUODENOSCOPY (EGD);  Surgeon: Tye Millet, DO;  Location: [facility] ENDOSCOPY;  Service: General;  Laterality: N/A;   HEMORRHOID BANDING  11/05/2014   Dr. Claudene, [place]   HYSTEROSCOPY WITH D & C  08/21/2021   Dr. Greig Jules at [facility]   NEPHRECTOMY Left 01/18/2021   Dr. Lorene Marina, [facility].  Renal cell ca.   OOPHORECTOMY  05/08/1999   for recurrent ovarian cyst    SOCIAL HISTORY: Social History   Socioeconomic History   Marital status: Married    Spouse name: Not on file   Number of  children: Not on file   Years of education: Not on file   Highest education level: Not on file  Occupational History    Employer: SELF EMPLOYED    Comment: [employer]  Tobacco Use   Smoking status: Never   Smokeless tobacco: Never  Vaping Use   Vaping status: Never Used  Substance and Sexual Activity   Alcohol use: Yes    Alcohol/week: 0.0 - 1.0 standard drinks of alcohol   Drug use: No   Sexual activity: Yes    Partners: Male    Birth control/protection: Post-menopausal    Comment: husband vasectomy  Other Topics Concern   Not on file  Social History  Narrative   Lives with husband, remarried. 2 children, 33YO and 35YO, one child lives [place].      Work - Physicist, medical      Diet - regular diet      Exercise - works out 3-4 times per week   Social Drivers of [employer]: Low Risk  (06/07/2023)   Received from [facility]   Overall Financial Resource Strain (CARDIA)    Difficulty of Paying Living Expenses: Not very hard  Food Insecurity: No Food Insecurity (06/07/2023)   Received from [facility]   Hunger Vital Sign    Within the past 12 months, you worried that your food would run out before you got the money to buy more.: Never true    Within the past 12 months, the food you bought just didn't last and you didn't have money to get more.: Never true  Transportation Needs: No Transportation Needs (06/07/2023)   Received from [facility] - Transportation    In the past 12 months, has lack of transportation kept you from medical appointments or from getting medications?: No    Lack of Transportation (Non-Medical): No  Physical Activity: Not on file  Stress: Not on file  Social Connections: Not on file  Intimate Partner Violence: Not on file    FAMILY HISTORY: Family History  Problem Relation Age of Onset   Diabetes Mother    Hypertension Mother    Heart disease  Father    Diabetes Brother    Stomach cancer Brother    Ovarian cancer Paternal Aunt    Cancer Paternal Uncle        unk type   Cervical cancer Paternal Aunt    Pancreatic cancer Cousin        d. 17s   Cancer Cousin        blood cancer   Other Cousin        d. brain tumor 30s/40s   Lung cancer Cousin        x2; hx smoking   Cancer Cousin        metastatic   Thyroid  cancer Niece        dx 30s, neg. GT    ALLERGIES:  is allergic to penicillins.  MEDICATIONS:  Current Outpatient Medications  Medication Sig Dispense Refill   CRANBERRY PO Take by mouth.     FLUoxetine  (PROZAC ) 40 MG capsule Take 40 mg by mouth daily.     JARDIANCE 10 MG TABS tablet Take 10 mg by mouth daily.     losartan (COZAAR) 50 MG tablet Take 50 mg by mouth daily.     magnesium oxide (MAG-OX) 400 MG tablet Take 400 mg by mouth daily.     MAGNESIUM PO Take by mouth daily.     Omega-3 Fatty Acids (FISH OIL PEARLS) 183.33 MG CAPS Take by mouth.     pantoprazole (PROTONIX) 40 MG tablet Take 40 mg by mouth daily.     temazepam  (RESTORIL ) 30 MG capsule TAKE 1 CAPSULE AT BEDTIME AS NEEDED INSOMNIA 30 capsule 5   ALPRAZolam  (XANAX ) 0.25 MG tablet Take 1 tablet (0.25 mg total) by mouth 3 (three) times daily as needed for sleep or anxiety (Take 1 tablet by mouth every 8 hours as needed for anxiety). 30 tablet 1   triamterene -hydrochlorothiazide (DYAZIDE) 37.5-25 MG capsule Take 1 capsule by mouth every morning. (Patient not taking: Reported on 11/20/2023)  No current facility-administered medications for this visit.     PHYSICAL EXAMINATION: ECOG PERFORMANCE STATUS: 0 - Asymptomatic Vitals:   11/20/23 1436  BP: 130/83  Pulse: 66  Resp: 18  Temp: (!) 97.5 F (36.4 C)   There were no vitals filed for this visit.    Physical Exam Constitutional:      General: She is not in acute distress. HENT:     Head: Normocephalic and atraumatic.  Eyes:     General: No scleral icterus. Cardiovascular:     Rate  and Rhythm: Normal rate.  Pulmonary:     Effort: Pulmonary effort is normal. No respiratory distress.  Abdominal:     General: There is no distension.  Musculoskeletal:        General: No deformity. Normal range of motion.     Cervical back: Normal range of motion.  Skin:    Findings: No rash.  Neurological:     Mental Status: She is alert and oriented to person, place, and time. Mental status is at baseline.     Cranial Nerves: No cranial nerve deficit.  Psychiatric:     Comments: Anxious   no discrete mass in right axillary area of concern.  No discrete mass of right elbow.   LABORATORY DATA:  I have reviewed the data as listed    Latest Ref Rng & Units 08/29/2023   10:43 AM 02/07/2023    9:52 AM 04/04/2022    1:57 PM  CBC  WBC 4.0 - 10.5 K/uL 5.6  4.6  4.9   Hemoglobin 12.0 - 15.0 g/dL 86.6  87.1  87.0   Hematocrit 36.0 - 46.0 % 40.4  38.7  39.7   Platelets 150 - 400 K/uL 207  167  172       Latest Ref Rng & Units 08/29/2023   10:43 AM 02/07/2023    9:52 AM 04/04/2022    1:57 PM  CMP  Glucose 70 - 99 mg/dL 894  95  94   BUN 8 - 23 mg/dL 26  21  25    Creatinine 0.44 - 1.00 mg/dL 8.67  8.62  8.65   Sodium 135 - 145 mmol/L 134  129  138   Potassium 3.5 - 5.1 mmol/L 3.5  4.2  4.3   Chloride 98 - 111 mmol/L 102  97  104   CO2 22 - 32 mmol/L 25  27  27    Calcium 8.9 - 10.3 mg/dL 9.3  9.2  9.2   Total Protein 6.5 - 8.1 g/dL 7.3  6.9  7.1   Total Bilirubin 0.0 - 1.2 mg/dL 1.1  0.8  0.5   Alkaline Phos 38 - 126 U/L 51  60  63   AST 15 - 41 U/L 20  17  20    ALT 0 - 44 U/L 16  15  17      RADIOGRAPHIC STUDIES: I have personally reviewed the radiological images as listed and agreed with the findings in the report. No results found.

## 2023-11-20 NOTE — Assessment & Plan Note (Signed)
 Patient is very anxious. Physical examination showed no palpable lymphadenopathy Obtain right axillary US 

## 2023-11-20 NOTE — Assessment & Plan Note (Signed)
 [facility] recommends to increase fluoxetine  to 60mg  daily. Patient plan to discuss with Dr. Cleotilde who is managing her anxiety.  Encourage patient to further discuss hypochondria management.

## 2023-11-21 NOTE — Progress Notes (Signed)
    PC Review Flag'? Med Question / Side Effect 'Flagged for IDT' details Per patient she shared that her PCP office called her and advised that they received the medication recommendation from [facility]. Per her PCP she is suppose to take her current medication of fluoxetine  of 40mg  twice a day. However, during the call with this [facility] the patient realized that she combined her old medication (40mg  once daily) with the new medication and shared that she will continue to take the medication as prescribed twice daily until the bottle is complete. Thereafter she will consider the medication recommendation from [facility]. [facility] will schedule a follow-up call with member on 11/29/23  Reviewed at IDT? Psychiatric Consultant, Coach, and [facility] consulted/reviewed Member during weekly Interdisciplinary Team Meeting.  IDT Outcomes Specialist Communication Request IDT Outcome Details (Psychiatric consultant clinical feedback, details about next-steps, reason for change in diagnosis, etc....)  Psychiatric Recommendation: Take medication to the pharmacy and have this decipher which pills are 40mg  and which are 20mg     Follow-Up Required? Yes Provider to Follow-Up [facility]  Name Jeniyah, Menor Date of Birth 11-Sep-1955  Final Intake Summary [facility] to follow-up with member regarding recc from psychiatrist and follow up with patients PCP office regarding their actual recommendation for fluoxetine

## 2023-11-28 ENCOUNTER — Ambulatory Visit
Admission: RE | Admit: 2023-11-28 | Discharge: 2023-11-28 | Disposition: A | Discharge status: Home / Self Care | Source: Ambulatory Visit | Attending: Oncology | Admitting: Oncology

## 2023-11-28 ENCOUNTER — Telehealth: Payer: Self-pay | Admitting: *Deleted

## 2023-11-28 DIAGNOSIS — Z1502 Genetic susceptibility to malignant neoplasm of ovary: Secondary | ICD-10-CM | POA: Diagnosis not present

## 2023-11-28 DIAGNOSIS — Z1509 Genetic susceptibility to other malignant neoplasm: Secondary | ICD-10-CM | POA: Diagnosis not present

## 2023-11-28 DIAGNOSIS — Z1589 Genetic susceptibility to other disease: Secondary | ICD-10-CM | POA: Diagnosis not present

## 2023-11-28 DIAGNOSIS — M7989 Other specified soft tissue disorders: Secondary | ICD-10-CM | POA: Insufficient documentation

## 2023-11-28 DIAGNOSIS — Z1501 Genetic susceptibility to malignant neoplasm of breast: Secondary | ICD-10-CM | POA: Diagnosis not present

## 2023-11-28 DIAGNOSIS — Z1239 Encounter for other screening for malignant neoplasm of breast: Secondary | ICD-10-CM | POA: Diagnosis not present

## 2023-11-28 MED ORDER — GADOBUTROL 1 MMOL/ML IV SOLN
7.0000 mL | Freq: Once | INTRAVENOUS | Status: AC | PRN
  Administered 2023-11-28: 7 mL via INTRAVENOUS

## 2023-11-28 NOTE — Telephone Encounter (Signed)
 Sending the message right now.called back to radiology.  Please look at the bilateral breast scan done today.  [facility] radiology says that you need to look at it right away.

## 2023-11-29 ENCOUNTER — Encounter: Payer: Self-pay | Admitting: Oncology

## 2023-11-29 ENCOUNTER — Other Ambulatory Visit: Payer: Self-pay

## 2023-11-29 DIAGNOSIS — J90 Pleural effusion, not elsewhere classified: Secondary | ICD-10-CM

## 2023-11-29 NOTE — Telephone Encounter (Signed)
 Per Lauren  CT has been scheduled and pt has been notified :)

## 2023-12-02 ENCOUNTER — Ambulatory Visit
Admission: RE | Admit: 2023-12-02 | Discharge: 2023-12-02 | Disposition: A | Discharge status: Home / Self Care | Source: Ambulatory Visit | Attending: Internal Medicine | Admitting: Internal Medicine

## 2023-12-02 ENCOUNTER — Other Ambulatory Visit: Payer: Self-pay | Admitting: Internal Medicine

## 2023-12-02 DIAGNOSIS — J9811 Atelectasis: Secondary | ICD-10-CM | POA: Insufficient documentation

## 2023-12-02 DIAGNOSIS — C642 Malignant neoplasm of left kidney, except renal pelvis: Secondary | ICD-10-CM

## 2023-12-02 DIAGNOSIS — R918 Other nonspecific abnormal finding of lung field: Secondary | ICD-10-CM | POA: Diagnosis not present

## 2023-12-02 DIAGNOSIS — J90 Pleural effusion, not elsewhere classified: Secondary | ICD-10-CM | POA: Insufficient documentation

## 2023-12-02 DIAGNOSIS — I700 Atherosclerosis of aorta: Secondary | ICD-10-CM | POA: Diagnosis not present

## 2023-12-02 MED ORDER — IOHEXOL 300 MG/ML  SOLN
75.0000 mL | Freq: Once | INTRAMUSCULAR | Status: AC | PRN
  Administered 2023-12-02: 60 mL via INTRAVENOUS

## 2023-12-06 ENCOUNTER — Telehealth: Payer: Self-pay

## 2023-12-06 NOTE — Telephone Encounter (Signed)
 Please cancel CT in August, pt had once done with PCP on 7/28.

## 2023-12-06 NOTE — Telephone Encounter (Signed)
-----   Message from Zelphia Cap sent at 12/05/2023  9:35 PM EDT ----- She has CT chest done, ordered by PCP. There is no pleural effusion. Please let her know that I recommend to cancel CT scheduled in August

## 2023-12-12 DIAGNOSIS — F411 Generalized anxiety disorder: Secondary | ICD-10-CM | POA: Insufficient documentation

## 2023-12-12 DIAGNOSIS — F331 Major depressive disorder, recurrent, moderate: Secondary | ICD-10-CM | POA: Insufficient documentation

## 2023-12-17 DIAGNOSIS — N83292 Other ovarian cyst, left side: Secondary | ICD-10-CM | POA: Diagnosis not present

## 2023-12-18 DIAGNOSIS — R0683 Snoring: Secondary | ICD-10-CM | POA: Diagnosis not present

## 2023-12-18 DIAGNOSIS — N1831 Chronic kidney disease, stage 3a: Secondary | ICD-10-CM | POA: Diagnosis not present

## 2023-12-18 DIAGNOSIS — N83202 Unspecified ovarian cyst, left side: Secondary | ICD-10-CM | POA: Diagnosis not present

## 2023-12-18 DIAGNOSIS — I10 Essential (primary) hypertension: Secondary | ICD-10-CM | POA: Diagnosis not present

## 2023-12-18 DIAGNOSIS — Z713 Dietary counseling and surveillance: Secondary | ICD-10-CM | POA: Diagnosis not present

## 2023-12-18 DIAGNOSIS — Z6832 Body mass index (BMI) 32.0-32.9, adult: Secondary | ICD-10-CM | POA: Diagnosis not present

## 2023-12-18 DIAGNOSIS — E66811 Obesity, class 1: Secondary | ICD-10-CM | POA: Diagnosis not present

## 2023-12-30 ENCOUNTER — Other Ambulatory Visit

## 2024-01-28 DIAGNOSIS — Z905 Acquired absence of kidney: Secondary | ICD-10-CM | POA: Diagnosis not present

## 2024-01-28 DIAGNOSIS — E871 Hypo-osmolality and hyponatremia: Secondary | ICD-10-CM | POA: Diagnosis not present

## 2024-01-28 DIAGNOSIS — N1831 Chronic kidney disease, stage 3a: Secondary | ICD-10-CM | POA: Diagnosis not present

## 2024-02-20 ENCOUNTER — Other Ambulatory Visit: Payer: Self-pay

## 2024-02-20 ENCOUNTER — Emergency Department
Admission: EM | Admit: 2024-02-20 | Discharge: 2024-02-20 | Discharge status: Against Medical Advice | Attending: Emergency Medicine | Admitting: Emergency Medicine

## 2024-02-20 DIAGNOSIS — Z5321 Procedure and treatment not carried out due to patient leaving prior to being seen by health care provider: Secondary | ICD-10-CM | POA: Diagnosis not present

## 2024-02-20 DIAGNOSIS — R21 Rash and other nonspecific skin eruption: Secondary | ICD-10-CM | POA: Insufficient documentation

## 2024-02-20 NOTE — ED Triage Notes (Signed)
 Pt arrives POV, ambulatory to triage, gait steady, no acute distress noted, c/o rash on upper left chest that she woke up with this morning. Pt states she has felt some pain, like glass under skin in that area. Pt concerned for shingles. Denies pain at this time. Painful only when she touches it.

## 2024-02-21 DIAGNOSIS — R21 Rash and other nonspecific skin eruption: Secondary | ICD-10-CM | POA: Diagnosis not present

## 2024-02-21 DIAGNOSIS — R5383 Other fatigue: Secondary | ICD-10-CM | POA: Diagnosis not present

## 2024-02-21 DIAGNOSIS — I10 Essential (primary) hypertension: Secondary | ICD-10-CM | POA: Diagnosis not present

## 2024-02-21 DIAGNOSIS — N1831 Chronic kidney disease, stage 3a: Secondary | ICD-10-CM | POA: Diagnosis not present

## 2024-02-21 DIAGNOSIS — R7989 Other specified abnormal findings of blood chemistry: Secondary | ICD-10-CM | POA: Diagnosis not present

## 2024-02-21 DIAGNOSIS — Z03818 Encounter for observation for suspected exposure to other biological agents ruled out: Secondary | ICD-10-CM | POA: Diagnosis not present

## 2024-02-28 ENCOUNTER — Ambulatory Visit
Admission: RE | Admit: 2024-02-28 | Discharge: 2024-02-28 | Disposition: A | Discharge status: Home / Self Care | Source: Ambulatory Visit | Attending: Oncology | Admitting: Oncology

## 2024-02-28 DIAGNOSIS — K5730 Diverticulosis of large intestine without perforation or abscess without bleeding: Secondary | ICD-10-CM | POA: Diagnosis not present

## 2024-02-28 DIAGNOSIS — R946 Abnormal results of thyroid function studies: Secondary | ICD-10-CM | POA: Diagnosis not present

## 2024-02-28 DIAGNOSIS — R918 Other nonspecific abnormal finding of lung field: Secondary | ICD-10-CM | POA: Diagnosis not present

## 2024-02-28 DIAGNOSIS — C642 Malignant neoplasm of left kidney, except renal pelvis: Secondary | ICD-10-CM | POA: Insufficient documentation

## 2024-02-28 DIAGNOSIS — R7989 Other specified abnormal findings of blood chemistry: Secondary | ICD-10-CM | POA: Diagnosis not present

## 2024-02-28 DIAGNOSIS — N838 Other noninflammatory disorders of ovary, fallopian tube and broad ligament: Secondary | ICD-10-CM | POA: Diagnosis not present

## 2024-02-28 DIAGNOSIS — Z85528 Personal history of other malignant neoplasm of kidney: Secondary | ICD-10-CM | POA: Diagnosis not present

## 2024-03-02 ENCOUNTER — Encounter: Payer: Self-pay | Admitting: Oncology

## 2024-03-06 ENCOUNTER — Other Ambulatory Visit

## 2024-03-06 ENCOUNTER — Inpatient Hospital Stay: Admitting: Oncology

## 2024-03-06 ENCOUNTER — Telehealth: Payer: Self-pay | Admitting: Oncology

## 2024-03-06 NOTE — Assessment & Plan Note (Deleted)
#  Left kidney cancer status post nephrectomy. April 2025 CT chest abdomen pelvis wo contrast showed no recurrence.  Continue image surveillance every 6-12 months. She prefers Q74m imaging. Next due Oct 2025

## 2024-03-06 NOTE — Assessment & Plan Note (Deleted)
Recommend annual mammogram with annual breast MRI Patient has started colon cancer screening.  Recommend colonoscopy every 5 years

## 2024-03-06 NOTE — Telephone Encounter (Signed)
 Pt called and left a vm that she needed to r/s her appt from 10/31.  I called pt back and left a vm with the new date/time of her MD appt

## 2024-03-09 ENCOUNTER — Telehealth: Payer: Self-pay | Admitting: Oncology

## 2024-03-09 NOTE — Telephone Encounter (Signed)
 Pt wants to see MD sooner (she is currently scheduled for this Thursday 11/6). Pt states her under arm on the right side is swollen.

## 2024-03-12 ENCOUNTER — Encounter: Payer: Self-pay | Admitting: Oncology

## 2024-03-12 ENCOUNTER — Inpatient Hospital Stay: Attending: Oncology | Admitting: Oncology

## 2024-03-12 VITALS — BP 104/79 | HR 88 | Temp 98.4°F | Resp 18

## 2024-03-12 DIAGNOSIS — C642 Malignant neoplasm of left kidney, except renal pelvis: Secondary | ICD-10-CM | POA: Insufficient documentation

## 2024-03-12 DIAGNOSIS — M7989 Other specified soft tissue disorders: Secondary | ICD-10-CM

## 2024-03-12 DIAGNOSIS — Z1509 Genetic susceptibility to other malignant neoplasm: Secondary | ICD-10-CM | POA: Insufficient documentation

## 2024-03-12 DIAGNOSIS — Z1502 Genetic susceptibility to malignant neoplasm of ovary: Secondary | ICD-10-CM | POA: Diagnosis not present

## 2024-03-12 DIAGNOSIS — Z1231 Encounter for screening mammogram for malignant neoplasm of breast: Secondary | ICD-10-CM | POA: Diagnosis not present

## 2024-03-12 DIAGNOSIS — R911 Solitary pulmonary nodule: Secondary | ICD-10-CM | POA: Diagnosis not present

## 2024-03-12 DIAGNOSIS — F419 Anxiety disorder, unspecified: Secondary | ICD-10-CM

## 2024-03-12 DIAGNOSIS — Z1589 Genetic susceptibility to other disease: Secondary | ICD-10-CM | POA: Diagnosis not present

## 2024-03-12 DIAGNOSIS — Z1501 Genetic susceptibility to malignant neoplasm of breast: Secondary | ICD-10-CM | POA: Diagnosis not present

## 2024-03-12 NOTE — Progress Notes (Signed)
 Patient here for follow up. Pt reports that she continues to have swelling and aching under right axilla. Pt reports this is not new.

## 2024-03-12 NOTE — Assessment & Plan Note (Signed)
 Managed by her PCP Dr. Cleotilde

## 2024-03-12 NOTE — Progress Notes (Signed)
 Hematology/Oncology Progress note [phone] [phone]      Patient Care Team: Cleotilde Oneil FALCON, MD as PCP - General (Internal Medicine) Babara Call, MD as Consulting Physician (Oncology)  ASSESSMENT & PLAN:   Cancer Staging  Malignant neoplasm of left kidney [facility]) Staging form: Kidney, AJCC 8th Edition - Pathologic stage from 05/24/2021: Stage I (pT1b, pN0, cM0) - Signed by Babara Call, MD on 05/24/2021   Malignant neoplasm of left kidney (HCC) #Left kidney cancer status post nephrectomy. April 2025 CT chest abdomen pelvis wo contrast showed no recurrence.  Continue image surveillance every 6-12 months. She prefers Q81m imaging.   Lung nodule Small lung nodules.  Observation. On CT surveillance.   Monoallelic mutation of CHEK2 gene in female patient Recommend annual mammogram with annual breast MRI Patient has started colon cancer screening.  Recommend colonoscopy every 5 years   Anxiety Managed by her PCP Dr. Cleotilde    Right axillary swelling Physical examination showed no palpable lymphadenopathy. The palpable area could be accessory breast.  Previous right axillary US  showed no LN with pathological appearance.     Orders Placed This Encounter  Procedures   CT CHEST ABDOMEN PELVIS WO CONTRAST    Standing Status:   Future    Expected Date:   09/11/2024    Expiration Date:   03/12/2025    Preferred imaging location?:   [facility]    If indicated for the ordered procedure, I authorize the administration of oral contrast media per Radiology protocol:   Yes    Does the patient have a contrast media/X-ray dye allergy?:   No   MM 3D SCREENING MAMMOGRAM BILATERAL BREAST    Standing Status:   Future    Expected Date:   05/12/2024    Expiration Date:   03/12/2025    Reason for Exam (SYMPTOM  OR DIAGNOSIS REQUIRED):   screening mammo    Preferred imaging location?:   [facility]   Follow up -6 months All questions were answered. The patient knows  to call the clinic with any problems, questions or concerns.  Call Babara, MD, PhD [facility] Hematology Oncology 03/12/2024   CHIEF COMPLAINTS/REASON FOR VISIT:  High risk for development of breast cancer, history of kidney cancer  HISTORY OF PRESENTING ILLNESS:   Carla Fuentes is a  68 y.o.  female presents for heterozygous CHEK2 mutation, history of kidney cancer,  Patient reports that her brother has been sick for the past few years due to unintentional weight loss and was eventually diagnosed with gastric cancer and passed away.  Brother told patient  to check breast cancer gene. She mentioned to gynecologist and was referred to genetic counselor for testing.    08/10/2020, patient had a genetic testing was tested positive for heterozygous CHEK2 Mother was adopted. Family history is positive for brother with stomach cancer, paternal aunt ovarian cancer, another paternal aunt cervical cancer, paternal uncle cancer with unknown details, paternal cousin pancreatic cancer, another cousin with blood cancer, second paternal cousin lung cancer, niece thyroid  cancer  Oncology History Overview Note  RCC was managed by her Energy Manager.    Malignant neoplasm of left kidney (HCC)  11/21/2020 Imaging    CT abdomen pelvis wo contrast Intermediate attenuation mass at upper pole of LEFT kidney 4.7 x 4.6 x 4.6 cm    11/28/2020 Imaging   MRI abdomen w wo contrast: heterogeneously contrast enhancing, partially exophytic mass of the lateral superior pole of the left kidney measuring 4.7 x  4.6 cm, consistent with renal cell carcinoma. Although not definitive, appearance of the left renal vein is suspicious for renal vein invasion   12/05/2020 Initial Diagnosis   Malignant neoplasm of left kidney (HCC)  01/18/2021 left nephrectomy and adrenalectomy, left perihilar lymphadenectomy.  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  Benign adrenal gland.  1 lymph node was examined and was negative for  carcinoma. pT1b pN0    05/24/2021 Cancer Staging   Staging form: Kidney, AJCC 8th Edition - Pathologic stage from 05/24/2021: Stage I (pT1b, pN0, cM0) - Signed by Babara Call, MD on 05/24/2021 Stage prefix: Initial diagnosis   08/18/2021 Imaging    CT RCC protocol abdomen pelvis with and without contrast at [facility] showed status post left nephrectomy without evidence of recurrence or metastatic disease.  Nonspecific left inguinal lymphadenopathy.  Attention on follow-up.  Left ovarian cyst can be assessed for stability on follow-up imaging.     08/21/2021 Procedure    patient had hysteroscopy, D&C, benign endometrial biopsy.   02/28/2022 Imaging   CT abdomen pelvis without contrast showed 1. Status post left nephrectomy without evidence of local recurrence. 2. No evidence of metastatic disease in the abdomen or pelvis. 3. Right middle lobe 4 mm pulmonary nodule is nonspecific, and was not seen on recent prior imaging studies although this appears to be outside of the few field of view on those examinations. Suggest follow-up dedicated chest CT for further evaluation. 4. Cystic left ovarian lesions previously evaluated by pelvic ultrasound June 06, 2021 and characterized as 3 adjacent renal cysts measuring up to 3.6 cm appear unchanged from CT November 21, 2020. 5. Sigmoid colonic diverticulosis without findings of acute diverticulitis   03/13/2022 Imaging   CT chest with contrast 1. 4 mm right middle lobe pulmonary nodule is stable in the interval since prior abdomen/pelvis CT and also comparing back to remote chest CT of 05/18/2011. No followup imaging is recommended. 2. 3 mm subpleural left upper lobe pulmonary nodule is new, likely benign. Although likely benign, a non-contrast chest CT can be considered in 12 months.    08/20/2023 Imaging   CT chest abdomen pelvis wo contrast  1. Status post left nephrectomy and adrenalectomy without evidence of local recurrence or metastatic disease in the  chest, abdomen or pelvis on noncontrast enhanced CT. 2. Stable scattered tiny pulmonary nodules, likely benign. 3. Similar appearance of the macrolobulated 4.7 cm left ovarian lesion previously evaluated on pelvic ultrasounds most recent as November 19, 2022. 4.  Aortic Atherosclerosis    09/28/2023 Imaging    CT scan showed no signs of disease recurrence.  Stable and unchanged small lung nodules, kidney cyst, left adnexal cyst     09/04/2021, seen by cardiology for family history of hypertrophic cardiomyopathy. 09/11/2021, bilateral breast MRI with and without contrast showed no evidence of breast malignancy.  INTERVAL HISTORY EMRY TOBIN is a 68 y.o. female who has above history reviewed by me today presents for follow up visit for management of  At the risk of developing breast cancer, history of left renal cell carcinoma. Pt reports that she continues to have swelling and aching under right axilla. This is a chronic issue for her.  No fever chills.     Review of Systems  Constitutional:  Negative for appetite change, chills, fatigue and fever.  HENT:   Negative for hearing loss and voice change.   Eyes:  Negative for eye problems.  Respiratory:  Negative for chest tightness and cough.  Cardiovascular:  Negative for chest pain.  Gastrointestinal:  Negative for abdominal distention, abdominal pain and blood in stool.  Endocrine: Negative for hot flashes.  Genitourinary:  Negative for difficulty urinating and frequency.   Musculoskeletal:  Negative for arthralgias.  Skin:  Negative for itching and rash.  Neurological:  Negative for extremity weakness.  Hematological:  Negative for adenopathy.  Psychiatric/Behavioral:  Negative for confusion. The patient is nervous/anxious.   See HPI  MEDICAL HISTORY:  Past Medical History:  Diagnosis Date   Abnormal Pap smear    Anxiety    Basal cell adenocarcinoma    skin, face   Colitis 01/05/2015   treated wtih antibiotics   Depression     Family history of lung cancer    Family history of ovarian cancer    Family history of pancreatic cancer    Family history of stomach cancer    Family history of thyroid  cancer    GERD (gastroesophageal reflux disease)    History of renal cell cancer 2022   s/p left nephrectomy at [facility]   Hypertension    started on medication for LEE   Positive test for genetic breast cancer susceptibility marker    CHEK2+ gene   TIA (transient ischemic attack)    initially question as TIA, evaluation negative.  Neurologist felt this was anxiety related.    SURGICAL HISTORY: Past Surgical History:  Procedure Laterality Date   CERVICAL BIOPSY  W/ LOOP ELECTRODE EXCISION     CESAREAN SECTION     2, FTP with fetal distress   COLONOSCOPY  01/06/2015   repeat 5 years   COLONOSCOPY WITH PROPOFOL  N/A 05/30/2020   Procedure: COLONOSCOPY WITH PROPOFOL ;  Surgeon: Tye Millet, DO;  Location: [facility] ENDOSCOPY;  Service: General;  Laterality: N/A;   ESOPHAGOGASTRODUODENOSCOPY N/A 05/30/2020   Procedure: ESOPHAGOGASTRODUODENOSCOPY (EGD);  Surgeon: Tye Millet, DO;  Location: [facility] ENDOSCOPY;  Service: General;  Laterality: N/A;   HEMORRHOID BANDING  11/05/2014   Dr. Claudene, [place]   HYSTEROSCOPY WITH D & C  08/21/2021   Dr. Greig Jules at [facility]   NEPHRECTOMY Left 01/18/2021   Dr. Lorene Marina, [facility].  Renal cell ca.   OOPHORECTOMY  05/08/1999   for recurrent ovarian cyst    SOCIAL HISTORY: Social History   Socioeconomic History   Marital status: Married    Spouse name: Not on file   Number of children: Not on file   Years of education: Not on file   Highest education level: Not on file  Occupational History    Employer: SELF EMPLOYED    Comment: [employer]  Tobacco Use   Smoking status: Never   Smokeless tobacco: Never  Vaping Use   Vaping status: Never Used  Substance and Sexual Activity   Alcohol use: Yes    Alcohol/week: 0.0 - 1.0 standard drinks of alcohol   Drug use: No    Sexual activity: Yes    Partners: Male    Birth control/protection: Post-menopausal    Comment: husband vasectomy  Other Topics Concern   Not on file  Social History Narrative   Lives with husband, remarried. 2 children, 33YO and 35YO, one child lives [place].      Work - Physicist, Medical      Diet - regular diet      Exercise - works out 3-4 times per week   Social Drivers of [employer]: Low Risk  (06/07/2023)   Received from [facility]   Overall Financial Resource Strain (CARDIA)    Difficulty of Paying Living Expenses: Not very hard  Food Insecurity: No Food Insecurity (06/07/2023)   Received from [facility]   Hunger Vital Sign    Within the past 12 months, you worried that your food would run out before you got the money to buy more.: Never true    Within the past 12 months, the food you bought just didn't last and you didn't have money to get more.: Never true  Transportation Needs: No Transportation Needs (06/07/2023)   Received from [facility] - Transportation    In the past 12 months, has lack of transportation kept you from medical appointments or from getting medications?: No    Lack of Transportation (Non-Medical): No  Physical Activity: Not on file  Stress: Not on file  Social Connections: Not on file  Intimate Partner Violence: Not on file    FAMILY HISTORY: Family History  Problem Relation Age of Onset   Diabetes Mother    Hypertension Mother    Heart disease Father    Diabetes Brother    Stomach cancer Brother    Ovarian cancer Paternal Aunt    Cancer Paternal Uncle        unk type   Cervical cancer Paternal Aunt    Pancreatic cancer Cousin        d. 83s   Cancer Cousin        blood cancer   Other Cousin        d. brain tumor 30s/40s   Lung cancer Cousin        x2; hx smoking   Cancer Cousin        metastatic   Thyroid  cancer Niece        dx 30s,  neg. GT    ALLERGIES:  is allergic to penicillins.  MEDICATIONS:  Current Outpatient Medications  Medication Sig Dispense Refill   Collagen-Vitamin C-Biotin (COLLAGEN PO) Take by mouth.     FLUoxetine  (PROZAC ) 40 MG capsule Take 40 mg by mouth daily.     JARDIANCE 10 MG TABS tablet Take 10 mg by mouth daily.     losartan (COZAAR) 50 MG tablet Take 50 mg by mouth daily.     MAGNESIUM PO Take by mouth daily.     Omega-3 Fatty Acids (FISH OIL PEARLS) 183.33 MG CAPS Take by mouth.     pantoprazole (PROTONIX) 40 MG tablet Take 40 mg by mouth daily.     temazepam  (RESTORIL ) 30 MG capsule TAKE 1 CAPSULE AT BEDTIME AS NEEDED INSOMNIA 30 capsule 5   No current facility-administered medications for this visit.     PHYSICAL EXAMINATION: ECOG PERFORMANCE STATUS: 0 - Asymptomatic Vitals:   03/12/24 1029  BP: 104/79  Pulse: 88  Resp: 18  Temp: 98.4 F (36.9 C)   Filed Weights      Physical Exam Constitutional:      General: She is not in acute distress. HENT:     Head: Normocephalic and atraumatic.  Eyes:     General: No scleral icterus. Cardiovascular:     Rate and Rhythm: Normal rate.  Pulmonary:     Effort: Pulmonary effort is normal. No respiratory distress.  Abdominal:     General: There is no distension.  Musculoskeletal:        General: No deformity. Normal range of motion.     Cervical back: Normal range of motion.  Skin:  Findings: No rash.  Neurological:     Mental Status: She is alert and oriented to person, place, and time. Mental status is at baseline.     Cranial Nerves: No cranial nerve deficit.  Psychiatric:     Comments: Anxious   no discrete mass in right axillary area of concern.  No discrete mass of right elbow.   LABORATORY DATA:  I have reviewed the data as listed    Latest Ref Rng & Units 08/29/2023   10:43 AM 02/07/2023    9:52 AM 04/04/2022    1:57 PM  CBC  WBC 4.0 - 10.5 K/uL 5.6  4.6  4.9   Hemoglobin 12.0 - 15.0 g/dL 86.6  87.1   87.0   Hematocrit 36.0 - 46.0 % 40.4  38.7  39.7   Platelets 150 - 400 K/uL 207  167  172       Latest Ref Rng & Units 08/29/2023   10:43 AM 02/07/2023    9:52 AM 04/04/2022    1:57 PM  CMP  Glucose 70 - 99 mg/dL 894  95  94   BUN 8 - 23 mg/dL 26  21  25    Creatinine 0.44 - 1.00 mg/dL 8.67  8.62  8.65   Sodium 135 - 145 mmol/L 134  129  138   Potassium 3.5 - 5.1 mmol/L 3.5  4.2  4.3   Chloride 98 - 111 mmol/L 102  97  104   CO2 22 - 32 mmol/L 25  27  27    Calcium 8.9 - 10.3 mg/dL 9.3  9.2  9.2   Total Protein 6.5 - 8.1 g/dL 7.3  6.9  7.1   Total Bilirubin 0.0 - 1.2 mg/dL 1.1  0.8  0.5   Alkaline Phos 38 - 126 U/L 51  60  63   AST 15 - 41 U/L 20  17  20    ALT 0 - 44 U/L 16  15  17      RADIOGRAPHIC STUDIES: I have personally reviewed the radiological images as listed and agreed with the findings in the report. CT CHEST ABDOMEN PELVIS WO CONTRAST Result Date: 03/02/2024 CLINICAL DATA:  Renal cell carcinoma follow-up * Tracking Code: BO * EXAM: CT CHEST, ABDOMEN AND PELVIS WITHOUT CONTRAST TECHNIQUE: Multidetector CT imaging of the chest, abdomen and pelvis was performed following the standard protocol without IV contrast. RADIATION DOSE REDUCTION: This exam was performed according to the departmental dose-optimization program which includes automated exposure control, adjustment of the mA and/or kV according to patient size and/or use of iterative reconstruction technique. COMPARISON:  CT chest, 12/02/2023, CT chest abdomen pelvis, 08/20/2023 FINDINGS: CT CHEST FINDINGS Cardiovascular: Aortic atherosclerosis. Normal heart size. No pericardial effusion. Mediastinum/Nodes: No enlarged mediastinal, hilar, or axillary lymph nodes. Thyroid  gland, trachea, and esophagus demonstrate no significant findings. Lungs/Pleura: Unchanged tiny ground-glass nodules in the posterior right upper lobe measuring 0.3 cm (series 4, image 27) and in the lateral segment right middle lobe measuring 0.4 cm (series 4,  image 82). Unchanged, bandlike scarring in the medial segment right middle lobe, lingula, and lung bases. No pleural effusion or pneumothorax. Musculoskeletal: No chest wall abnormality. No acute osseous findings. CT ABDOMEN PELVIS FINDINGS Hepatobiliary: No solid liver abnormality is seen. No gallstones, gallbladder wall thickening, or biliary dilatation. Pancreas: Unremarkable. No pancreatic ductal dilatation or surrounding inflammatory changes. Spleen: Normal in size without significant abnormality. Adrenals/Urinary Tract: Status post left nephrectomy and adrenalectomy. No suspicious soft tissue in the nephrectomy bed. Right adrenal and  kidney are normal, without renal calculi, solid lesion, or hydronephrosis. Bladder is unremarkable. Stomach/Bowel: Stomach is within normal limits. Appendix appears normal. No evidence of bowel wall thickening, distention, or inflammatory changes. Sigmoid diverticulosis. Vascular/Lymphatic: No significant vascular findings are present. No enlarged abdominal or pelvic lymph nodes. Reproductive: Unchanged lobulated left ovarian cystic lesion measuring 4.7 x 3.3 cm (series 2, image 96). Other: No abdominal wall hernia or abnormality. No ascites. Musculoskeletal: No acute osseous findings. IMPRESSION: 1. Status post left nephrectomy and adrenalectomy. No noncontrast evidence of local recurrence. 2. No noncontrast evidence of lymphadenopathy or metastatic disease in the chest, abdomen, or pelvis. 3. Unchanged tiny ground-glass nodules in the right upper lobe and right middle lobe, almost certainly benign and incidental sequelae of prior infection or inflammation. Continued attention on follow-up. 4. Unchanged lobulated left ovarian cystic lesion measuring 4.7 x 3.3 cm. Continued attention on follow-up. 5. Sigmoid diverticulosis without evidence of acute diverticulitis. Aortic Atherosclerosis (ICD10-I70.0). Electronically Signed   By: Marolyn JONETTA Jaksch M.D.   On: 03/02/2024 07:34

## 2024-03-12 NOTE — Assessment & Plan Note (Addendum)
#  Left kidney cancer status post nephrectomy. April 2025 CT chest abdomen pelvis wo contrast showed no recurrence.  Continue image surveillance every 6-12 months. She prefers [facility] imaging.

## 2024-03-12 NOTE — Assessment & Plan Note (Addendum)
 Physical examination showed no palpable lymphadenopathy. The palpable area could be accessory breast.  Previous right axillary US  showed no LN with pathological appearance.

## 2024-03-12 NOTE — Assessment & Plan Note (Signed)
Recommend annual mammogram with annual breast MRI Patient has started colon cancer screening.  Recommend colonoscopy every 5 years

## 2024-03-12 NOTE — Assessment & Plan Note (Signed)
Small lung nodules.  Observation. On CT surveillance.

## 2024-04-04 NOTE — Progress Notes (Signed)
 [facility] - Discharge Date of Service: Apr 01 2024 Time of Service: 3:36 PM  Discharge Plan Member has been discharged from [facility]. Please see below Discharge Plan for details. The Member has been provided a copy of their Discharge Plan via the [facility] Care portal. If you have any questions, please contact our Clinical Team at [phone].  Name Carla Fuentes  Date of Birth Dec 12, 1955  Discharged Reason No longer eligible: Contract with Provider Terminated  Discharging to: Referring Specialist  Referring Provider: Babara Call, MD  Current Psychiatric Medications 1. Alprazolam  (Xanax ) 0.25 MG tablet > 1 tablet by mouth 3x daily as needed for sleep/anxiety > 30 tablet 2. Fluoxetine  (Prozac ) 40 mg capsule > take 40 mg 1. Prescribed by freddrick Cleotilde Oneil JULIANNA, MD 3. Tamazapam 30 mg > every night; member believes that this helps with her sleep schedule and has been taking this medication approximately 10 years  Psychiatric Medication Recommendation Transition PCP prescribing Referrals: None   Discharge Summary Care Delivered: Member disengaged in services after a few follow-ups with [facility] in May 2025. [facility] team engaged in extensive outreach but were unable to connect with Member. Member reported significant confusion on dose and frequency of Prozac . Member advised to take Prozac  medication bottle to pharmacy for support distinguishing Prozac  20 versus Prozac  40mg  pills, and guidance on prescription dose and frequency. Current status: Reason for discharge: Member disengaged and has not responded to outreach Should referring organization contact?: Unknown if Member is in need of additional support If mbr is in need of support mbr can reach out to [name] for ongoing care  Final Intake Summary [facility] has a scheduled follow-up visit with member 09/25/23 at 1200 est; [facility] will fax med rec from psychiatrist to patient's provider to review. Once reviewed, [facility] will provide psycho  education to member at visit. Carla Fuentes is a 68 year old White/Caucasian female diagnosed with CKD stage III - hx renal cancer, referred by Babara Call, MD at [facility] for anxiety. Member is open to psychiatric medication recommendations. Assessments: [name] scored 11 on the PHQ-9 reporting symptoms of moderate. Safety Concerns: None at this time [name] scored 18 on the GAD-7 reporting symptoms of moderate . Psych Tx/Hx: [name] currently takes: Alprazolam  (Xanax ) 0.25 MG tablet > 1 tablet by mouth 3x daily as needed for sleep/anxiety > 30 tablet Fluoxetine  (Prozac ) 40 mg capsule > take 40 mg by mouth daily; unsure if this is helping and shares that she has been on and off of medication Tamazapam 30 mg > every night ; member believes that this helps with her sleep schedule and has been taking this medication approximately 10 years Prior Psychiatric Medication Details Zoloft  Wellbutrin  Citalopram > made the patient very drowsy and is not interested in taking again Psychosocial Hx: Feeling Down; Member shares that learning a new business, 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 Sleep difficulties: Cannot fall asleep unless she takes medication over the last 10 years with temazepam   [facility] Provider: Virgil PARAS, Behavioral Health Care Manager]

## 2024-04-17 DIAGNOSIS — M5416 Radiculopathy, lumbar region: Secondary | ICD-10-CM | POA: Diagnosis not present

## 2024-04-17 DIAGNOSIS — M9903 Segmental and somatic dysfunction of lumbar region: Secondary | ICD-10-CM | POA: Diagnosis not present

## 2024-04-17 DIAGNOSIS — M9902 Segmental and somatic dysfunction of thoracic region: Secondary | ICD-10-CM | POA: Diagnosis not present

## 2024-04-17 DIAGNOSIS — M5414 Radiculopathy, thoracic region: Secondary | ICD-10-CM | POA: Diagnosis not present

## 2024-04-23 IMAGING — MR MR BREAST BILAT WO/W CM
2 of 9 series · 6 of 48 positions shown · IV contrast (gadavist)
Comparison: Prior exams including previous breast MRI dated
09/26/2020.

CLINICAL DATA: High risk screening. Family history of breast
carcinoma. Positive [id number] gene mutation.

EXAM:
BILATERAL BREAST MRI WITH AND WITHOUT CONTRAST
TECHNIQUE: Multiplanar, multisequence MR images of both breasts were obtained
prior to and following the intravenous administration of 7 ml of
Gadavist

[Series 2: T1 · axial · B · 1.5mm · 1.02mm/px · z∈[-86,+81]mm · 5 of 112 slices shown]
[im 1/112]
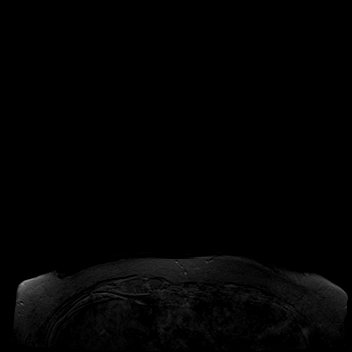
[im 28/112]
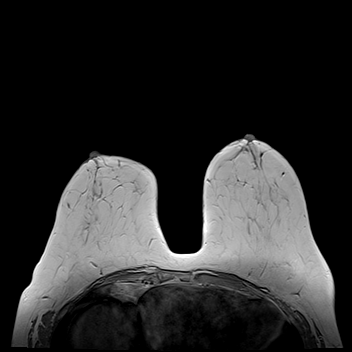
[im 56/112]
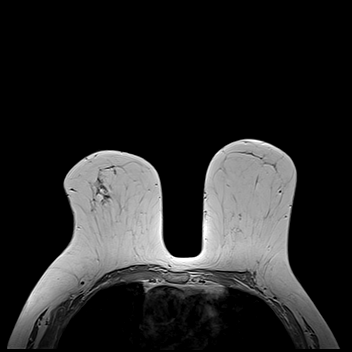
[im 84/112]
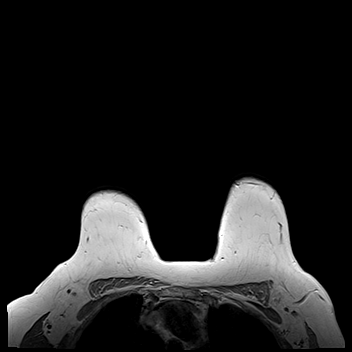
[im 112/112]
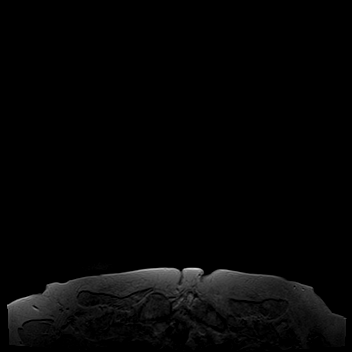

[Series 3: T2 · axial · B · 3.0mm · 1.02mm/px · 1 of 46 slices shown]
[im 1/46]
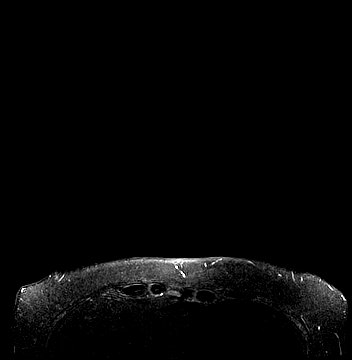

[6 of 48 positions shown; findings below may reference images not displayed]

Three-dimensional MR images were rendered by post-processing of the
original MR data on an independent workstation. The
three-dimensional MR images were interpreted, and findings are
reported in the following complete MRI report for this study. Three
dimensional images were evaluated at the independent interpreting
workstation using the DynaCAD thin client.
FINDINGS: Breast composition: b. Scattered fibroglandular tissue.

Background parenchymal enhancement: Mild

Right breast: No mass or abnormal enhancement.

Left breast: No mass or abnormal enhancement.

Lymph nodes: No abnormal appearing lymph nodes.

Ancillary findings:  None.
IMPRESSION: No evidence of breast malignancy.

RECOMMENDATION:
1. Annual screening mammography.
2. Supplemental high risk annual screening breast MRI.

BI-RADS CATEGORY  1: Negative.

## 2024-04-24 ENCOUNTER — Telehealth: Payer: Self-pay

## 2024-04-24 NOTE — Telephone Encounter (Signed)
 Clinical Social Work was referred by [facility] for follow up after their discharge.  CSW attempted to contact patient by phone.  Left voicemail with contact information and request for return call.

## 2024-05-01 ENCOUNTER — Inpatient Hospital Stay: Attending: Oncology

## 2024-05-01 NOTE — Progress Notes (Signed)
 [facility] CSW Progress Note  Clinical Child Psychotherapist contacted patient by phone to follow-up on emotional support after [facility] discharge.  Patient reports her husband was diagnosed with brain cancer last month and is starting treatment on 05/11/24 at [facility].  She stated she is unable to go to counseling at this time due to time constraints.    Interventions: CSW provided supportive counseling while patient discussed her husband.  Her current challenge is finding time to care for herself.  She will contact [facility] in the future if needed for counseling.  Per discussion, CSW made referral to [facility].      Follow Up Plan:  Patient will contact CSW with any support or resource needs    Macario CHRISTELLA Au, LCSW Clinical Social Worker [facility]

## 2024-05-11 ENCOUNTER — Telehealth: Payer: Self-pay | Admitting: Oncology

## 2024-05-11 NOTE — Telephone Encounter (Signed)
 Spoke to pt and asked if I could transfer the call to [place] to get her mammo scheduled. Pt stated she is currently at [facility] with her husband who has brain cancer.   No appt scheduled at this time, pt aware she can call [facility] when it is best for her to get the appt scheduled

## 2024-06-18 ENCOUNTER — Encounter

## 2024-09-11 ENCOUNTER — Other Ambulatory Visit

## 2024-09-18 ENCOUNTER — Inpatient Hospital Stay: Admitting: Oncology
